# Patient Record
Sex: Male | Born: 1960 | Hispanic: Yes | Marital: Married | Smoking: Never smoker
Health system: Southern US, Community
[De-identification: ages and names within clinical notes are randomized; demographics above are authoritative.]

## PROBLEM LIST (undated history)

## (undated) DIAGNOSIS — D619 Aplastic anemia, unspecified: Secondary | ICD-10-CM

## (undated) HISTORY — DX: Aplastic anemia, unspecified: D61.9

---

## 2018-11-19 ENCOUNTER — Other Ambulatory Visit (HOSPITAL_COMMUNITY): Payer: Self-pay

## 2018-11-19 ENCOUNTER — Other Ambulatory Visit: Payer: Self-pay

## 2018-11-19 ENCOUNTER — Inpatient Hospital Stay (HOSPITAL_COMMUNITY)
Admission: EM | Admit: 2018-11-19 | Discharge: 2018-12-05 | DRG: 809 | Disposition: A | Discharge status: Home / Self Care | Payer: Self-pay | Attending: Internal Medicine | Admitting: Internal Medicine

## 2018-11-19 ENCOUNTER — Encounter (HOSPITAL_COMMUNITY): Payer: Self-pay | Admitting: Emergency Medicine

## 2018-11-19 DIAGNOSIS — R17 Unspecified jaundice: Secondary | ICD-10-CM | POA: Diagnosis present

## 2018-11-19 DIAGNOSIS — Z20828 Contact with and (suspected) exposure to other viral communicable diseases: Secondary | ICD-10-CM | POA: Diagnosis present

## 2018-11-19 DIAGNOSIS — D6949 Other primary thrombocytopenia: Secondary | ICD-10-CM | POA: Diagnosis present

## 2018-11-19 DIAGNOSIS — M25561 Pain in right knee: Secondary | ICD-10-CM | POA: Diagnosis present

## 2018-11-19 DIAGNOSIS — Z800 Family history of malignant neoplasm of digestive organs: Secondary | ICD-10-CM

## 2018-11-19 DIAGNOSIS — Z6825 Body mass index (BMI) 25.0-25.9, adult: Secondary | ICD-10-CM

## 2018-11-19 DIAGNOSIS — D7589 Other specified diseases of blood and blood-forming organs: Secondary | ICD-10-CM

## 2018-11-19 DIAGNOSIS — D61818 Other pancytopenia: Principal | ICD-10-CM

## 2018-11-19 DIAGNOSIS — R634 Abnormal weight loss: Secondary | ICD-10-CM | POA: Diagnosis present

## 2018-11-19 DIAGNOSIS — R52 Pain, unspecified: Secondary | ICD-10-CM

## 2018-11-19 DIAGNOSIS — E876 Hypokalemia: Secondary | ICD-10-CM | POA: Diagnosis present

## 2018-11-19 LAB — CBC WITH DIFFERENTIAL/PLATELET
Abs Immature Granulocytes: 0.01 10*3/uL (ref 0.00–0.07)
Basophils Absolute: 0 10*3/uL (ref 0.0–0.1)
Basophils Relative: 0 %
Eosinophils Absolute: 0.1 10*3/uL (ref 0.0–0.5)
Eosinophils Relative: 2 %
HCT: 17.2 % — ABNORMAL LOW (ref 39.0–52.0)
Hemoglobin: 5.8 g/dL — CL (ref 13.0–17.0)
Immature Granulocytes: 0 %
Lymphocytes Relative: 54 %
Lymphs Abs: 1.7 10*3/uL (ref 0.7–4.0)
MCH: 34.7 pg — ABNORMAL HIGH (ref 26.0–34.0)
MCHC: 33.7 g/dL (ref 30.0–36.0)
MCV: 103 fL — ABNORMAL HIGH (ref 80.0–100.0)
Monocytes Absolute: 0.2 10*3/uL (ref 0.1–1.0)
Monocytes Relative: 8 %
Neutro Abs: 1.1 10*3/uL — ABNORMAL LOW (ref 1.7–7.7)
Neutrophils Relative %: 36 %
Platelets: <5 10*3/uL — CL (ref 150–400)
RBC: 1.67 MIL/uL — ABNORMAL LOW (ref 4.22–5.81)
RDW: 16.8 % — ABNORMAL HIGH (ref 11.5–15.5)
WBC: 3.1 10*3/uL — ABNORMAL LOW (ref 4.0–10.5)
nRBC: 0 % (ref 0.0–0.2)

## 2018-11-19 LAB — COMPREHENSIVE METABOLIC PANEL
ALT: 25 U/L (ref 0–44)
AST: 38 U/L (ref 15–41)
Albumin: 3.7 g/dL (ref 3.5–5.0)
Alkaline Phosphatase: 52 U/L (ref 38–126)
Anion gap: 5 (ref 5–15)
BUN: 21 mg/dL — ABNORMAL HIGH (ref 6–20)
CO2: 24 mmol/L (ref 22–32)
Calcium: 8.6 mg/dL — ABNORMAL LOW (ref 8.9–10.3)
Chloride: 108 mmol/L (ref 98–111)
Creatinine, Ser: 0.92 mg/dL (ref 0.61–1.24)
GFR calc Af Amer: >60 mL/min (ref >60)
GFR calc non Af Amer: >60 mL/min (ref >60)
Glucose, Bld: 107 mg/dL — ABNORMAL HIGH (ref 70–99)
Potassium: 3.4 mmol/L — ABNORMAL LOW (ref 3.5–5.1)
Sodium: 137 mmol/L (ref 135–145)
Total Bilirubin: 1.4 mg/dL — ABNORMAL HIGH (ref 0.3–1.2)
Total Protein: 7.3 g/dL (ref 6.5–8.1)

## 2018-11-19 LAB — DIC (DISSEMINATED INTRAVASCULAR COAGULATION)PANEL
D-Dimer, Quant: 1.63 ug/mL-FEU — ABNORMAL HIGH (ref 0.00–0.50)
Fibrinogen: 317 mg/dL (ref 210–475)
INR: 1 (ref 0.8–1.2)
Platelets: <5 10*3/uL — CL (ref 150–400)
Prothrombin Time: 13.2 seconds (ref 11.4–15.2)
Smear Review: NONE SEEN
aPTT: 30 seconds (ref 24–36)

## 2018-11-19 LAB — IRON AND TIBC
Iron: 140 ug/dL (ref 45–182)
Saturation Ratios: 86 % — ABNORMAL HIGH (ref 17.9–39.5)
TIBC: 162 ug/dL — ABNORMAL LOW (ref 250–450)
UIBC: 22 ug/dL

## 2018-11-19 LAB — RETICULOCYTES
Immature Retic Fract: 16.1 % — ABNORMAL HIGH (ref 2.3–15.9)
RBC.: 1.58 MIL/uL — ABNORMAL LOW (ref 4.22–5.81)
Retic Count, Absolute: 20.2 10*3/uL (ref 19.0–186.0)
Retic Ct Pct: 1.3 % (ref 0.4–3.1)

## 2018-11-19 LAB — PROTIME-INR
INR: 1.1 (ref 0.8–1.2)
Prothrombin Time: 14 seconds (ref 11.4–15.2)

## 2018-11-19 LAB — FERRITIN: Ferritin: 624 ng/mL — ABNORMAL HIGH (ref 24–336)

## 2018-11-19 LAB — ABO/RH: ABO/RH(D): O POS

## 2018-11-19 LAB — VITAMIN B12: Vitamin B-12: 299 pg/mL (ref 180–914)

## 2018-11-19 LAB — HIV ANTIBODY (ROUTINE TESTING W REFLEX): HIV Screen 4th Generation wRfx: NONREACTIVE

## 2018-11-19 LAB — FOLATE: Folate: 21.8 ng/mL (ref >5.9)

## 2018-11-19 LAB — PREPARE RBC (CROSSMATCH)

## 2018-11-19 LAB — LACTATE DEHYDROGENASE: LDH: 298 U/L — ABNORMAL HIGH (ref 98–192)

## 2018-11-19 LAB — SAVE SMEAR(SSMR), FOR PROVIDER SLIDE REVIEW

## 2018-11-19 MED ORDER — POTASSIUM CHLORIDE CRYS ER 20 MEQ PO TBCR
40.0000 meq | EXTENDED_RELEASE_TABLET | Freq: Once | ORAL | Status: AC
  Administered 2018-11-19: 40 meq via ORAL
  Filled 2018-11-19: qty 2

## 2018-11-19 MED ORDER — ALBUTEROL SULFATE (2.5 MG/3ML) 0.083% IN NEBU: 2.5000 mg | INHALATION_SOLUTION | Freq: Four times a day (QID) | RESPIRATORY_TRACT | Status: DC | PRN

## 2018-11-19 MED ORDER — MAGIC MOUTHWASH W/LIDOCAINE
2.0000 mL | Freq: Three times a day (TID) | ORAL | Status: DC | PRN
  Filled 2018-11-19: qty 5

## 2018-11-19 MED ORDER — PANTOPRAZOLE SODIUM 40 MG PO TBEC
40.0000 mg | DELAYED_RELEASE_TABLET | Freq: Every day | ORAL | Status: DC
  Administered 2018-11-19 – 2018-11-24 (×6): 40 mg via ORAL
  Filled 2018-11-19 (×6): qty 1

## 2018-11-19 MED ORDER — ONDANSETRON HCL 4 MG PO TABS: 4.0000 mg | ORAL_TABLET | Freq: Four times a day (QID) | ORAL | Status: DC | PRN

## 2018-11-19 MED ORDER — ONDANSETRON HCL 4 MG/2ML IJ SOLN: 4.0000 mg | Freq: Four times a day (QID) | INTRAMUSCULAR | Status: DC | PRN

## 2018-11-19 MED ORDER — ENSURE ENLIVE PO LIQD
237.0000 mL | Freq: Two times a day (BID) | ORAL | Status: DC
  Administered 2018-11-20 – 2018-12-04 (×26): 237 mL via ORAL

## 2018-11-19 MED ORDER — SODIUM CHLORIDE 0.9 % IV SOLN
10.0000 mL/h | Freq: Once | INTRAVENOUS | Status: AC
  Administered 2018-11-19: 10 mL/h via INTRAVENOUS

## 2018-11-19 MED ORDER — ACETAMINOPHEN 650 MG RE SUPP: 650.0000 mg | Freq: Four times a day (QID) | RECTAL | Status: DC | PRN

## 2018-11-19 MED ORDER — ACETAMINOPHEN 325 MG PO TABS: 650.0000 mg | ORAL_TABLET | Freq: Four times a day (QID) | ORAL | Status: DC | PRN

## 2018-11-19 MED ORDER — SODIUM CHLORIDE 0.9% IV SOLUTION
Freq: Once | INTRAVENOUS | Status: AC
  Administered 2018-11-20: 01:00:00 via INTRAVENOUS

## 2018-11-19 NOTE — ED Provider Notes (Signed)
[facility] EMERGENCY DEPARTMENT Provider Note   CSN: [id number] Arrival date & time: 11/19/18  0356     History   Chief Complaint Chief Complaint  Patient presents with  . Fatigue    HPI Victor Porter is a 58 y.o. male who presents with bruising and bleeding gums.  No significant past medical history.  The patient states that over the past month and a half he has had gradually worsening bruising bruising and unexplained bleeding.  He states that he typically bruises very easily but not like this.  He reports associated fatigue.  He denies any fever, chills, chest pain, shortness of breath, abdominal pain, blood in the urine or stool.  He does not take any medicines.  He denies any use of NSAIDs.  No recent infections.  He states he came from [place] in February but has not traveled anywhere since then.  No family or personal history of any bleeding problems     HPI  History reviewed. No pertinent past medical history.  There are no active problems to display for this patient.   History reviewed. No pertinent surgical history.      Home Medications    Prior to Admission medications   Not on File    Family History History reviewed. No pertinent family history.  Social History Social History   Tobacco Use  . Smoking status: Never Smoker  . Smokeless tobacco: Never Used  Substance Use Topics  . Alcohol use: Never    Frequency: Never  . Drug use: Never     Allergies   Patient has no known allergies.   Review of Systems Review of Systems  Constitutional: Positive for fatigue. Negative for fever.  HENT:       +bleeding gums  Respiratory: Negative for shortness of breath.   Cardiovascular: Negative for chest pain.  Gastrointestinal: Negative for abdominal pain and blood in stool.  Genitourinary: Negative for hematuria.  Skin: Positive for color change.  Neurological: Negative for headaches.  Hematological: Bruises/bleeds easily.   All other systems reviewed and are negative.    Physical Exam Updated Vital Signs Temp 98.9 F (37.2 C) (Oral)   Ht 5\' 9"  (1.753 m)   Wt 77.1 kg   BMI 25.10 kg/m   Physical Exam Vitals signs and nursing note reviewed.  Constitutional:      General: He is not in acute distress.    Appearance: Normal appearance. He is well-developed. He is not ill-appearing.     Comments: Calm, cooperative. Very pleasant. Spanish speaking  HENT:     Head: Normocephalic and atraumatic.     Mouth/Throat:     Comments: Bleeding gums with ecchymosis of tongue and hard palate Eyes:     General: No scleral icterus.       Right eye: No discharge.        Left eye: No discharge.     Conjunctiva/sclera: Conjunctivae normal.     Pupils: Pupils are equal, round, and reactive to light.  Neck:     Musculoskeletal: Normal range of motion.  Cardiovascular:     Rate and Rhythm: Normal rate and regular rhythm.  Pulmonary:     Effort: Pulmonary effort is normal. No respiratory distress.     Breath sounds: Normal breath sounds.  Abdominal:     General: There is no distension.     Palpations: Abdomen is soft.     Tenderness: There is no abdominal tenderness.  Skin:    General: Skin  is warm and dry.     Findings: Bruising present.     Comments: Diffuse bruising over the trunk, upper and lower extremities with scattered petechiae  Neurological:     Mental Status: He is alert and oriented to person, place, and time.  Psychiatric:        Mood and Affect: Mood normal.        Behavior: Behavior normal.      ED Treatments / Results  Labs (all labs ordered are listed, but only abnormal results are displayed) Labs Reviewed  CBC WITH DIFFERENTIAL/PLATELET - Abnormal; Notable for the following components:      Result Value   WBC 3.1 (*)    RBC 1.67 (*)    Hemoglobin 5.8 (*)    HCT 17.2 (*)    MCV 103.0 (*)    MCH 34.7 (*)    RDW 16.8 (*)    Platelets <5 (*)    Neutro Abs 1.1 (*)    All other  components within normal limits  COMPREHENSIVE METABOLIC PANEL - Abnormal; Notable for the following components:   Potassium 3.4 (*)    Glucose, Bld 107 (*)    BUN 21 (*)    Calcium 8.6 (*)    Total Bilirubin 1.4 (*)    All other components within normal limits  NOVEL CORONAVIRUS, NAA (HOSPITAL ORDER, SEND-OUT TO REF LAB)  PROTIME-INR  LACTATE DEHYDROGENASE  VITAMIN B12  FOLATE  IRON AND TIBC  FERRITIN  RETICULOCYTES  SAVE SMEAR (SSMR)  PATHOLOGIST SMEAR REVIEW  DIC (DISSEMINATED INTRAVASCULAR COAGULATION) PANEL  HIV ANTIBODY (ROUTINE TESTING W REFLEX)  HEPATITIS C ANTIBODY  TYPE AND SCREEN  PREPARE RBC (CROSSMATCH)  ABO/RH    EKG    Radiology No results found.  Procedures Procedures (including critical care time)  CRITICAL CARE Performed by: Bethel BornKelly Marie Gekas   Total critical care time: 30 minutes  Critical care time was exclusive of separately billable procedures and treating other patients.  Critical care was necessary to treat or prevent imminent or life-threatening deterioration.  Critical care was time spent personally by me on the following activities: development of treatment plan with patient and/or surrogate as well as nursing, discussions with consultants, evaluation of patient's response to treatment, examination of patient, obtaining history from patient or surrogate, ordering and performing treatments and interventions, ordering and review of laboratory studies, ordering and review of radiographic studies, pulse oximetry and re-evaluation of patient's condition.   Medications Ordered in ED Medications  0.9 %  sodium chloride infusion (has no administration in time range)     Initial Impression / Assessment and Plan / ED Course  I have reviewed the triage vital signs and the nursing notes.  Pertinent labs & imaging results that were available during my care of the patient were reviewed by me and considered in my medical decision making (see chart  for details).  58 year old male presents with atraumatic bruising and petechiae.  There is been worsening over the past month and a half.  He is hemodynamically stable and afebrile.  Will obtain basic labs.  Shared visit with Dr. Preston FleetingGlick.  CBC is remarkable for pancytopenia.  Hemoglobin is 5.8 and platelets are less than 5.  We will give packed red blood cells.  CMP is overall unremarkable.  Patient will be admitted to hospitalist service.  Final Clinical Impressions(s) / ED Diagnoses   Final diagnoses:  Pancytopenia (HCC)  Macrocytosis    ED Discharge Orders    None  Bethel BornGekas, Kelly Marie, PA-C 11/19/18 [id number]    Dione BoozeGlick, David, MD 11/19/18 [phone]

## 2018-11-19 NOTE — ED Notes (Signed)
ED TO INPATIENT HANDOFF REPORT  ED Nurse Name and Phone #: Brie [phone]  S Name/Age/Gender Victor Porter 58 y.o. male Room/Bed: 022C/022C  Code Status   Code Status: Not on file  Home/SNF/Other Home Patient oriented to: self, place, time and situation Is this baseline? Yes   Triage Complete: Triage complete  Chief Complaint Blisters in mouth   Triage Note Spanish speaker pt c/o increase fatigue for the past 2 weeks and multiple bruises all over his body and blisters on his mouth. Pt denies any fever, chills, or change on smell or taste.    Allergies No Known Allergies  Level of Care/Admitting Diagnosis ED Disposition    ED Disposition Condition Comment   Admit  [facility] [[id number]]  Level of Care: Progressive [102]  Covid Evaluation: Screening Protocol (No Symptoms)  Diagnosis: Pancytopenia [facility](HCC) [[id number]]) [[id number]]  Admitting Physician: Magnus IvanRIADHOSP, MCADMITS [[id number]]  Attending Physician: Magnus IvanRIADHOSP, MCADMITS [[id number]]  Estimated length of stay: past midnight tomorrow  Certification:: I certify this patient will need inpatient services for at least 2 midnights  PT Class (Do Not Modify): Inpatient [101]  PT Acc Code (Do Not Modify): Private [1]       B Medical/Surgery History History reviewed. No pertinent past medical history. History reviewed. No pertinent surgical history.   A IV Location/Drains/Wounds Patient Lines/Drains/Airways Status   Active Line/Drains/Airways    Name:   Placement date:   Placement time:   Site:   Days:   Peripheral IV 11/19/18 Right Antecubital   11/19/18    0434    Antecubital   less than 1   Peripheral IV 11/19/18 Right Wrist   11/19/18    0630    Wrist   less than 1          Intake/Output Last 24 hours No intake or output data in the 24 hours ending 11/19/18 [id number]  Labs/Imaging Results for orders placed or performed during the hospital encounter of 11/19/18 (from the past 48 hour(s))  CBC with  Differential     Status: Abnormal   Collection Time: 11/19/18  4:20 AM  Result Value Ref Range   WBC 3.1 (L) 4.0 - 10.5 K/uL   RBC 1.67 (L) 4.22 - 5.81 MIL/uL   Hemoglobin 5.8 (LL) 13.0 - 17.0 g/dL    Comment: REPEATED TO VERIFY THIS CRITICAL RESULT HAS VERIFIED AND BEEN CALLED TO B.Nandi Tonnesen,RN BY BONNIE Ural Acree ON 06 12 2020 AT 0545, AND HAS BEEN READ BACK.     HCT 17.2 (L) 39.0 - 52.0 %   MCV 103.0 (H) 80.0 - 100.0 fL   MCH 34.7 (H) 26.0 - 34.0 pg   MCHC 33.7 30.0 - 36.0 g/dL   RDW 84.116.8 (H) 32.411.5 - 40.115.5 %   Platelets <5 (LL) 150 - 400 K/uL    Comment: REPEATED TO VERIFY PLATELET COUNT CONFIRMED BY SMEAR Immature Platelet Fraction may be clinically indicated, consider ordering this additional test [id number] CRITICAL RESULT CALLED TO, READ BACK BY AND VERIFIED WITH: B.Markan Cazarez,RN 11/19/2018 0545 DAVISB    nRBC 0.0 0.0 - 0.2 %   Neutrophils Relative % 36 %   Neutro Abs 1.1 (L) 1.7 - 7.7 K/uL   Lymphocytes Relative 54 %   Lymphs Abs 1.7 0.7 - 4.0 K/uL   Monocytes Relative 8 %   Monocytes Absolute 0.2 0.1 - 1.0 K/uL   Eosinophils Relative 2 %   Eosinophils Absolute 0.1 0.0 - 0.5 K/uL   Basophils Relative 0 %   Basophils  Absolute 0.0 0.0 - 0.1 K/uL   Immature Granulocytes 0 %   Abs Immature Granulocytes 0.01 0.00 - 0.07 K/uL    Comment: Performed at [facility], [address] [place], [place] [address]  Protime-INR     Status: None   Collection Time: 11/19/18  4:20 AM  Result Value Ref Range   Prothrombin Time 14.0 11.4 - 15.2 seconds   INR 1.1 0.8 - 1.2    Comment: (NOTE) INR goal varies based on device and disease states. Performed at [facility], [address] [place], [place] [address]   Comprehensive metabolic panel     Status: Abnormal   Collection Time: 11/19/18  4:32 AM  Result Value Ref Range   Sodium 137 135 - 145 mmol/L   Potassium 3.4 (L) 3.5 - 5.1 mmol/L   Chloride 108 98 - 111 mmol/L   CO2 24 22 - 32 mmol/L   Glucose, Bld 107 (H) 70 - 99 mg/dL    BUN 21 (H) 6 - 20 mg/dL   Creatinine, Ser 0.980.92 0.61 - 1.24 mg/dL   Calcium 8.6 (L) 8.9 - 10.3 mg/dL   Total Protein 7.3 6.5 - 8.1 g/dL   Albumin 3.7 3.5 - 5.0 g/dL   AST 38 15 - 41 U/L   ALT 25 0 - 44 U/L   Alkaline Phosphatase 52 38 - 126 U/L   Total Bilirubin 1.4 (H) 0.3 - 1.2 mg/dL   GFR calc non Af Amer >60 >60 mL/min   GFR calc Af Amer >60 >60 mL/min   Anion gap 5 5 - 15    Comment: Performed at [facility], [address] [place], [place] [address]  Type and screen [facility]     Status: None (Preliminary result)   Collection Time: 11/19/18  5:00 AM  Result Value Ref Range   ABO/RH(D) O POS    Antibody Screen NEG    Sample Expiration 11/22/2018,2359    Unit Number [id number]    Blood Component Type RED CELLS,LR    Unit division 00    Status of Unit ALLOCATED    Transfusion Status OK TO TRANSFUSE    Crossmatch Result Compatible    Unit Number [id number]    Blood Component Type RED CELLS,LR    Unit division 00    Status of Unit ISSUED    Transfusion Status OK TO TRANSFUSE    Crossmatch Result      Compatible Performed at [facility], [address] [place], [place] [address]   ABO/Rh     Status: None (Preliminary result)   Collection Time: 11/19/18  5:00 AM  Result Value Ref Range   ABO/RH(D)      O POS Performed at [facility], [address] [place], [place] [address]   Prepare RBC     Status: None   Collection Time: 11/19/18  5:57 AM  Result Value Ref Range   Order Confirmation      ORDER PROCESSED BY BLOOD BANK Performed at [facility], [address] [place], [place] [address]   Reticulocytes     Status: Abnormal   Collection Time: 11/19/18  6:25 AM  Result Value Ref Range   Retic Ct Pct 1.3 0.4 - 3.1 %   RBC. 1.58 (L) 4.22 - 5.81 MIL/uL   Retic Count, Absolute 20.2 19.0 - 186.0 K/uL   Immature Retic Fract 16.1 (H) 2.3 - 15.9 %  Comment: Performed at [facility], [place] [address],  [place], [place] [address]   No results found.  Pending Labs Unresulted Labs (From admission, onward)    Start     Ordered   11/19/18 0645  HIV Antibody (routine testing w rflx)  Once,   R     11/19/18 0645   11/19/18 0623  Hepatitis C antibody  Once,   STAT     11/19/18 0622   11/19/18 0614  Save Smear  Once,   STAT     11/19/18 0614   11/19/18 0614  Pathologist smear review  Once,   STAT     11/19/18 0614   11/19/18 0614  DIC (disseminated intravasc coag) panel  ONCE - STAT,   STAT     11/19/18 0614   11/19/18 0558  Novel Coronavirus, NAA (hospital order; send-out to ref lab)  Once,   STAT    Comments: No isolation needed for this testing (if isolation ordered for another indication, maintain current isolation).   Question:  Pre-procedural testing  Answer:  Yes   11/19/18 0557   11/19/18 0554  Vitamin B12  (Anemia Panel (PNL))  ONCE - STAT,   STAT     11/19/18 0555   11/19/18 0554  Folate  (Anemia Panel (PNL))  ONCE - STAT,   STAT     11/19/18 0555   11/19/18 0554  Iron and TIBC  (Anemia Panel (PNL))  ONCE - STAT,   STAT     11/19/18 0555   11/19/18 0554  Ferritin  (Anemia Panel (PNL))  ONCE - STAT,   STAT     11/19/18 0555   11/19/18 0437  Lactate dehydrogenase  ONCE - STAT,   STAT     11/19/18 0436          Vitals/Pain Today's Vitals   11/19/18 0631 11/19/18 0633 11/19/18 0645 11/19/18 0700  BP: 119/76  124/77 125/75  Pulse: 67  66 62  Resp: (!) 22  (!) 23 17  Temp:  98 F (36.7 C)    TempSrc:  Oral    SpO2: 100%  100% 100%  Weight:      Height:      PainSc:        Isolation Precautions No active isolations  Medications Medications  potassium chloride SA (K-DUR) CR tablet 40 mEq (has no administration in time range)  0.9 %  sodium chloride infusion (10 mL/hr Intravenous New Bag/Given 11/19/18 0701)    Mobility walks Low fall risk   Focused Assessments    R Recommendations: See Admitting Provider Note  Report given to:   Additional Notes:

## 2018-11-19 NOTE — Plan of Care (Signed)
Patient stable, discussed POC with patient via interpreter, agreeable with plan, denies question/concerns at this time.

## 2018-11-19 NOTE — Consult Note (Addendum)
[facility]  [phone] [phone]    [place]  Referring MD:  Dr. Fuller Plan  Reason for Referral: Pancytopenia  HPI: Mr. Victor Porter is a 58 year old with no significant past medical history who presented to the ER with progressive fatigue, easily bruisability, and bleeding gums. History obtained through [employer] interpreter, Mckinley Jewel [phone].  The patient is originally from [place] and has been visiting family here in the [place] since February 2020.  He was unable to return to [place] secondary to the COVID-19 pandemic.  Work-up in the emergency room revealed a white blood cell count of 3.1, hemoglobin 5.8, elevated MCV and MCH, platelets less than 5000.  LFTs were normal but the total bilirubin was slightly elevated at 1.4.  LDH was elevated at 298.  Vitamin B12 and folate levels were normal.  Ferritin elevated at 624, percent saturation 86%, TIBC 162, iron 140.  Absolute reticulocyte count was not elevated.  To dose of packed red blood cells and a unit of platelets have been ordered.  There are no prior labs available to me to compare his current labs to.  The patient reports that he has become progressively more fatigued and had generalized weakness over the past 15 to 20 days.  He has also noticed some increased bruising, bleeding gums, and nosebleed over the past few days.  He reports anorexia and a weight loss of about 20 pounds over the past month.  Denies night sweats.  Patient denies headaches and dizziness.  Denies fevers and chills.  Denies chest discomfort, shortness of breath, cough.  Denies abdominal pain, nausea, vomiting, constipation, diarrhea.  Other than bleeding gums and epistaxis, he denies other bleeding.  He reports that he has been taking herbal life but is taking any other supplements, herbal products, or over-the-counter medications.  Hematology was asked see the patient to make recommendations  regarding his pancytopenia.  History reviewed. No pertinent past medical history.:  History reviewed. No pertinent surgical history.:   CURRENT MEDS: Current Facility-Administered Medications  Medication Dose Route Frequency Provider Last Rate Last Dose   0.9 %  sodium chloride infusion (Manually program via Guardrails IV Fluids)   Intravenous Once Norval Morton, MD   Stopped at 11/19/18 0940   acetaminophen (TYLENOL) tablet 650 mg  650 mg Oral Q6H PRN Norval Morton, MD       Or   acetaminophen (TYLENOL) suppository 650 mg  650 mg Rectal Q6H PRN Fuller Plan A, MD       albuterol (PROVENTIL) (2.5 MG/3ML) 0.083% nebulizer solution 2.5 mg  2.5 mg Nebulization Q6H PRN Norval Morton, MD       magic mouthwash w/lidocaine  2 mL Oral TID PRN Fuller Plan A, MD       ondansetron (ZOFRAN) tablet 4 mg  4 mg Oral Q6H PRN Fuller Plan A, MD       Or   ondansetron (ZOFRAN) injection 4 mg  4 mg Intravenous Q6H PRN Smith, Rondell A, MD       pantoprazole (PROTONIX) EC tablet 40 mg  40 mg Oral Daily Smith, Rondell A, MD          No Known Allergies:   Family History  Problem Relation Age of Onset   Cancer Father   :  Social History   Socioeconomic History   Marital status: Married    Spouse name: Not on file   Number of children: Not on file  Years of education: Not on file   Highest education level: Not on file  Occupational History   Not on file  Social Needs   Financial resource strain: Not on file   Food insecurity    Worry: Not on file    Inability: Not on file   Transportation needs    Medical: Not on file    Non-medical: Not on file  Tobacco Use   Smoking status: Never Smoker   Smokeless tobacco: Never Used  Substance and Sexual Activity   Alcohol use: Never    Frequency: Never   Drug use: Never   Sexual activity: Not on file  Lifestyle   Physical activity    Days per week: Not on file    Minutes per session: Not on file    Stress: Not on file  Relationships   Social connections    Talks on phone: Not on file    Gets together: Not on file    Attends religious service: Not on file    Active member of club or organization: Not on file    Attends meetings of clubs or organizations: Not on file    Relationship status: Not on file   Intimate partner violence    Fear of current or ex partner: Not on file    Emotionally abused: Not on file    Physically abused: Not on file    Forced sexual activity: Not on file  Other Topics Concern   Not on file  Social History Narrative   Not on file  :  REVIEW OF SYSTEMS: A comprehensive 14 point review of systems was negative except as noted in the HPI.  Exam: Patient Vitals for the past 24 hrs:  BP Temp Temp src Pulse Resp SpO2 Height Weight  11/19/18 0731 122/76 (!) 97.5 F (36.4 C) Oral (!) 57 (!) 22 100 % -- --  11/19/18 0720 118/77 -- -- 60 -- 100 % -- --  11/19/18 0719 -- 98 F (36.7 C) Oral -- -- -- -- --  11/19/18 0719 -- -- -- -- 18 -- -- --  11/19/18 0716 118/74 -- -- 76 16 100 % -- --  11/19/18 0700 125/75 -- -- 62 17 100 % -- --  11/19/18 0645 124/77 -- -- 66 (!) 23 100 % -- --  11/19/18 0633 -- 98 F (36.7 C) Oral -- -- -- -- --  11/19/18 0631 119/76 -- -- 67 (!) 22 100 % -- --  11/19/18 0615 117/72 -- -- 64 (!) 25 99 % -- --  11/19/18 0600 108/77 -- -- 60 20 100 % -- --  11/19/18 0545 110/76 -- -- 64 19 100 % -- --  11/19/18 0530 112/74 -- -- (!) 58 18 100 % -- --  11/19/18 0515 107/77 -- -- 60 18 100 % -- --  11/19/18 0500 115/74 -- -- 62 20 98 % -- --  11/19/18 0445 116/72 -- -- 65 16 100 % -- --  11/19/18 0430 116/72 -- -- 70 14 100 % -- --  11/19/18 0419 120/77 -- -- 66 -- 100 % -- --  11/19/18 0416 -- 98.9 F (37.2 C) Oral -- -- -- -- --  11/19/18 0411 -- -- -- -- -- -- _0  (1.753 m) 170 lb (77.1 kg)    General:  well-nourished in no acute distress.   Eyes:  no scleral icterus.   ENT: Ecchymosis noted to his tongue and posterior  pharynx.   Lymphatics:  Negative  cervical, supraclavicular or axillary adenopathy.   Respiratory: lungs were clear bilaterally without wheezing or crackles.   Cardiovascular:  Regular rate and rhythm, S1/S2, without murmur, rub or gallop.  There was no pedal edema.    GI:  abdomen was soft, flat, nontender, nondistended, without organomegaly.  Musculoskeletal:  no spinal tenderness of palpation of vertebral spine.   Skin exam showed multiple ecchymotic areas all over his body and petechiae.   Neuro exam was nonfocal.  Patient was alert and oriented.  Attention was good.   Language was appropriate.  Mood was normal without depression.  Speech was not pressured.  Thought content was not tangential.    LABS:  Lab Results  Component Value Date   WBC 3.1 (L) 11/19/2018   HGB 5.8 (LL) 11/19/2018   HCT 17.2 (L) 11/19/2018   PLT <5 (LL) 11/19/2018   GLUCOSE 107 (H) 11/19/2018   ALT 25 11/19/2018   AST 38 11/19/2018   NA 137 11/19/2018   K 3.4 (L) 11/19/2018   CL 108 11/19/2018   CREATININE 0.92 11/19/2018   BUN 21 (H) 11/19/2018   CO2 24 11/19/2018   INR 1.0 11/19/2018    No results found.   ASSESSMENT AND PLAN:  1. Pancytopenia 2. Hypokalemia  -Review peripheral smear -I have discussed the bone marrow biopsy with the patient and he is agreeable to this. Bone marrow biopsy by interventional radiology has been ordered.   -Transfuse packed red blood cells for hemoglobin less than 7.0 or active bleeding.  2 units of packed red blood cells have already been ordered for today. -Transfuse platelets for platelet count less than 10,000 or active bleeding.  2 units of platelets has been ordered today. -Follow daily CBC -Replete K+ per hospitalist.   Thank you for this referral.  Mikey Bussing, DNP, AGPCNP-BC, AOCNP  ADDENDUM: I saw and examined [name].  I agree with the above assessment.  I looked at his blood smear.  He absolutely has no platelets.  I would not think that this  would be an autoimmune thrombocytopenia given the marked anemia and leukopenia.  His retake count certainly is not high so he is not hemolyzing so this would not be an Evans syndrome.  I did see some immature white blood cells.  He has mostly mature myeloid cells.  He has mature lymphoid cells.  I saw no nucleated red blood cells.  He had no rouleaux formation.  There was no schistocytes.  Clearly, this is going to come down to his bone marrow biopsy.  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  His B12 level is okay so this would not be pernicious anemia.  I probably would get a CT scan or ultrasound of his abdomen looking for hepatomegaly or splenomegaly or for that matter any lymphadenopathy.  I would not think that this is some type of aplastic process from hepatitis.  His HIV is negative.  I must say that this is a very interesting case.  Again, it is going to come down to the bone marrow biopsy.  Hopefully this can be done on Monday.  There should be a preliminary result come Tuesday.  I would just support him for right now.  I would try to be somewhat conservative with platelet transfusions.  I agree with platelets for right now but unless he is bleeding, I would not give him platelets.  I will start him on Decadron just on the outside chance that this might be some immune  mediated thrombocytopenia.  We will follow along but we really have not much to add until the bone marrow biopsy is done.  Lattie Haw, MD  2 Timothy 1:7

## 2018-11-19 NOTE — ED Notes (Signed)
Linward Foster (sister)- [phone]. Sister went home and would like to be updated and informed when to pick him up.

## 2018-11-19 NOTE — H&P (Addendum)
History and Physical    Victor Porter OFB:[id number] DOB: 1960/11/11 DOA: 11/19/2018  Referring MD/NP/PA: Janetta Hora, PA-C PCP: Patient, No Pcp Per  Patient coming from: Home  Chief Complaint: Tiredness  I have personally briefly reviewed patient's old medical records in [place]   HPI: Victor Porter is a 58 y.o. Spanish-speaking male without known past medical; who presents with complaints of being progressively more tired over the last month.  Spanish interpreter services utilized.  He visiting from [place] here on February 20th.  His activity has been limited because of becoming easily fatigued.  As much as he can remember he is always easily bruised, but never to this extent.  He has bruises all over his body including head, tongue/mouth, upper, and lower remedies.  Denies having any significant falls or trauma to cause the bruising.  Complains of having a hard nodule where each bruises and is painful.  Associated symptoms include unwarranted weight loss of approximately 10 kg over 3 months, bleeding gums, and belching after meals.  He denies having any significant fever, chest pain, shortness of breath, dysuria, nausea, vomiting, or diarrhea.  Patient has no primary care provider and has not received regular healthcare.  He last had a few beers a couple months ago.  He does not routinely drink alcohol or smoke any tobacco.  His father passed away in 17-Jul-2004 at the age of 54, and had similar symptoms.  However, he reports that they were not told what caused his death.  Of note he does report that he has been using herbalife supplements including teas, formula 1, and some there other products.   The patient sister reports that their father died from stomach cancer and had been losing weight and had blood in his stools.  ED Course: On admission into the emergency department patient was found to be afebrile, pulse 57-76, respirations 14-25, and all other vital signs  maintained.  Labs revealed WBC 3.1, hemoglobin 5.8 with elevated MCV and MCH, platelets < 5, BUN 21, creatinine 0.92, LFTs wnl, total bilirubin 1.4, LDH 298, iron 140, TIBC 162, and ferritin 624.  Patient was ordered to be transfused 2 units of packed red blood cells.  Patient was accepted to a progressive bed as inpatient.  Review of Systems  Constitutional: Positive for malaise/fatigue and weight loss. Negative for fever.  HENT: Negative for congestion and sore throat.        Positive for bleeding gums  Eyes: Negative for photophobia and pain.  Respiratory: Negative for cough and shortness of breath.   Cardiovascular: Negative for chest pain and leg swelling.  Gastrointestinal: Negative for abdominal pain, diarrhea, nausea and vomiting.  Genitourinary: Negative for dysuria and hematuria.  Musculoskeletal: Negative for falls.  Skin: Negative for rash.       Multiple areas of bruising  Neurological: Positive for weakness. Negative for focal weakness and loss of consciousness.  Endo/Heme/Allergies: Negative for polydipsia. Bruises/bleeds easily.  Psychiatric/Behavioral: Negative for memory loss and substance abuse.    History reviewed. No pertinent past medical history.  History reviewed. No pertinent surgical history.   reports that he has never smoked. He has never used smokeless tobacco. He reports that he does not drink alcohol or use drugs.  No Known Allergies  Family History  Problem Relation Age of Onset  . Stomach cancer Father    Prior to Admission medications   Not on File    Physical Exam:  Constitutional: Middle-age male in NAD, calm, comfortable  Vitals:   11/19/18 0719 11/19/18 0719 11/19/18 0720 11/19/18 0731  BP:   118/77 122/76  Pulse:   60 (!) 57  Resp: 18   (!) 22  Temp:  98 F (36.7 C)    TempSrc:  Oral    SpO2:   100% 100%  Weight:      Height:       Eyes: PERRL, lids and conjunctivae normal ENMT: Bleeding present of the gums with bruise on the  left side of the tongue and left inner cheek. Neck: normal, supple, no masses, no thyromegaly Respiratory: clear to auscultation bilaterally, no wheezing, no crackles. Normal respiratory effort. No accessory muscle use.  Cardiovascular: Regular rate and rhythm, no murmurs / rubs / gallops. No extremity edema. 2+ pedal pulses. No carotid bruits.  Abdomen: no tenderness, no masses palpated. No hepatosplenomegaly. Bowel sounds positive.  Musculoskeletal: no clubbing / cyanosis. No joint deformity upper and lower extremities. Good ROM, no contractures. Normal muscle tone.  Skin: Bruising present of the left forehead and multiple areas of the upper and lower extremities with centrally located small areas of induration appreciated. Neurologic: CN 2-12 grossly intact. Sensation intact, DTR normal. Strength 5/5 in all 4.  Psychiatric: Normal judgment and insight. Alert and oriented x 3. Normal mood.     Labs on Admission: I have personally reviewed following labs and imaging studies  CBC: Recent Labs  Lab 11/19/18 0420  WBC 3.1*  NEUTROABS 1.1*  HGB 5.8*  HCT 17.2*  MCV 103.0*  PLT <5*   Basic Metabolic Panel: Recent Labs  Lab 11/19/18 0432  NA 137  K 3.4*  CL 108  CO2 24  GLUCOSE 107*  BUN 21*  CREATININE 0.92  CALCIUM 8.6*   GFR: Estimated Creatinine Clearance: 89.7 mL/min (by C-G formula based on SCr of 0.92 mg/dL). Liver Function Tests: Recent Labs  Lab 11/19/18 0432  AST 38  ALT 25  ALKPHOS 52  BILITOT 1.4*  PROT 7.3  ALBUMIN 3.7   No results for input(s): LIPASE, AMYLASE in the last 168 hours. No results for input(s): AMMONIA in the last 168 hours. Coagulation Profile: Recent Labs  Lab 11/19/18 0420  INR 1.1   Cardiac Enzymes: No results for input(s): CKTOTAL, CKMB, CKMBINDEX, TROPONINI in the last 168 hours. BNP (last 3 results) No results for input(s): PROBNP in the last 8760 hours. HbA1C: No results for input(s): HGBA1C in the last 72 hours. CBG: No  results for input(s): GLUCAP in the last 168 hours. Lipid Profile: No results for input(s): CHOL, HDL, LDLCALC, TRIG, CHOLHDL, LDLDIRECT in the last 72 hours. Thyroid Function Tests: No results for input(s): TSH, T4TOTAL, FREET4, T3FREE, THYROIDAB in the last 72 hours. Anemia Panel: Recent Labs    11/19/18 0625  VITAMINB12 299  FERRITIN 624*  TIBC 162*  IRON 140  RETICCTPCT 1.3   Urine analysis: No results found for: COLORURINE, APPEARANCEUR, LABSPEC, PHURINE, GLUCOSEU, HGBUR, BILIRUBINUR, KETONESUR, PROTEINUR, UROBILINOGEN, NITRITE, LEUKOCYTESUR Sepsis Labs: No results found for this or any previous visit (from the past 240 hour(s)).   Radiological Exams on Admission: No results found.    Assessment/Plan Pancytopenia: Acute.  Patient presents with reports of fatigue, easy bruising, and bleeding gums.  WBC 3.1, hemoglobin 5.8, and platelets <5, Vitamin B12 level 298, iron wnl, LDL 298, d-dimer 1.63, fibrinogen 317, INR 1, and APTT 30.  Suspect symptoms most likely related to malignancy, but given history must also question the possibility of toxicity from herbalife supplements use.  Discussed case  with oncology who recommended transfusion of platelets and obtain bone marrow biopsy.  -Admit to a progressive bed -Follow-up HIV, hepatitis C, DIC, peripheral smear, folate -Continue with transfusion of 2 units of packed red blood cells -Transfused 2 units of platelets -Check CT guided bone marrow biopsy ordered -Hematology/oncology consulted, will follow-up for further recommendation  Hypokalemia: Acute. Potassium 3.4 on admission.  Patient was given 40 mEq of potassium chloride in the ED. -Replace as needed  Weight loss: Patient reports weight loss of 10 kg over the last 2 months.  Suspect likely related with above.  Patient did report utilizing herbalife supplementation.  -Continue to monitor  Hyperbilirubinemia: Acute.  Total bilirubin mildly elevated to 1.4. -Continue to  monitor  DVT prophylaxis: SCDs   Code Status: Full Family Communication: Discussed plan of care with the patient's sister over the phone Disposition Plan: Possibly discharge home in 2 to 3 days depending on response to therapy Consults called: Hematology Admission status: inpatient   Norval Morton MD [facility] Pager [phone]   If 7PM-7AM, please contact night-coverage [email or web address] Password [facility]  11/19/2018, 7:46 AM

## 2018-11-19 NOTE — ED Triage Notes (Signed)
Spanish speaker pt c/o increase fatigue for the past 2 weeks and multiple bruises all over his body and blisters on his mouth. Pt denies any fever, chills, or change on smell or taste.

## 2018-11-19 NOTE — ED Notes (Signed)
Blood bank tried calling, blood is ready for htis pt

## 2018-11-20 ENCOUNTER — Inpatient Hospital Stay (HOSPITAL_COMMUNITY): Payer: Self-pay

## 2018-11-20 LAB — COMPREHENSIVE METABOLIC PANEL
ALT: 21 U/L (ref 0–44)
AST: 29 U/L (ref 15–41)
Albumin: 3.2 g/dL — ABNORMAL LOW (ref 3.5–5.0)
Alkaline Phosphatase: 57 U/L (ref 38–126)
Anion gap: 8 (ref 5–15)
BUN: 15 mg/dL (ref 6–20)
CO2: 22 mmol/L (ref 22–32)
Calcium: 8.7 mg/dL — ABNORMAL LOW (ref 8.9–10.3)
Chloride: 108 mmol/L (ref 98–111)
Creatinine, Ser: 0.72 mg/dL (ref 0.61–1.24)
GFR calc Af Amer: >60 mL/min (ref >60)
GFR calc non Af Amer: >60 mL/min (ref >60)
Glucose, Bld: 99 mg/dL (ref 70–99)
Potassium: 4 mmol/L (ref 3.5–5.1)
Sodium: 138 mmol/L (ref 135–145)
Total Bilirubin: 1.3 mg/dL — ABNORMAL HIGH (ref 0.3–1.2)
Total Protein: 6.7 g/dL (ref 6.5–8.1)

## 2018-11-20 LAB — PREPARE PLATELET PHERESIS
Unit division: 0
Unit division: 0
Unit division: 0

## 2018-11-20 LAB — CBC
HCT: 24.5 % — ABNORMAL LOW (ref 39.0–52.0)
Hemoglobin: 8.4 g/dL — ABNORMAL LOW (ref 13.0–17.0)
MCH: 33.2 pg (ref 26.0–34.0)
MCHC: 34.3 g/dL (ref 30.0–36.0)
MCV: 96.8 fL (ref 80.0–100.0)
Platelets: 41 10*3/uL — ABNORMAL LOW (ref 150–400)
RBC: 2.53 MIL/uL — ABNORMAL LOW (ref 4.22–5.81)
RDW: 16.9 % — ABNORMAL HIGH (ref 11.5–15.5)
WBC: 3.1 10*3/uL — ABNORMAL LOW (ref 4.0–10.5)
nRBC: 0 % (ref 0.0–0.2)

## 2018-11-20 LAB — BPAM PLATELET PHERESIS
Blood Product Expiration Date: 202006132359
Blood Product Expiration Date: 202006142359
Blood Product Expiration Date: 202006142359
ISSUE DATE / TIME: 202006121059
ISSUE DATE / TIME: 202006121150
ISSUE DATE / TIME: 202006121322
Unit Type and Rh: 6200
Unit Type and Rh: 6200
Unit Type and Rh: 7300

## 2018-11-20 LAB — HEPATITIS C ANTIBODY: HCV Ab: <0.1 s/co ratio (ref 0.0–0.9)

## 2018-11-20 LAB — NOVEL CORONAVIRUS, NAA (HOSP ORDER, SEND-OUT TO REF LAB; TAT 18-24 HRS): SARS-CoV-2, NAA: NOT DETECTED

## 2018-11-20 LAB — PATHOLOGIST SMEAR REVIEW

## 2018-11-20 LAB — HEMOGLOBIN AND HEMATOCRIT, BLOOD
HCT: 19.6 % — ABNORMAL LOW (ref 39.0–52.0)
Hemoglobin: 6.9 g/dL — CL (ref 13.0–17.0)

## 2018-11-20 LAB — PREPARE RBC (CROSSMATCH)

## 2018-11-20 MED ORDER — ADULT MULTIVITAMIN W/MINERALS CH
1.0000 | ORAL_TABLET | Freq: Every day | ORAL | Status: DC
  Administered 2018-11-20 – 2018-12-05 (×16): 1 via ORAL
  Filled 2018-11-20 (×16): qty 1

## 2018-11-20 MED ORDER — IOHEXOL 300 MG/ML  SOLN
100.0000 mL | Freq: Once | INTRAMUSCULAR | Status: AC | PRN
  Administered 2018-11-20: 15:00:00 100 mL via INTRAVENOUS

## 2018-11-20 MED ORDER — SODIUM CHLORIDE 0.9% IV SOLUTION
Freq: Once | INTRAVENOUS | Status: AC
  Administered 2018-11-20: 01:00:00 via INTRAVENOUS

## 2018-11-20 NOTE — Progress Notes (Signed)
Used the interpreter to explain the plan of care, CT scan at 1330 and contrast bottles Pt verbalizes understanding for drinking one at 1130 and one at 1230  Per CT technician pt cannot eat food at this time  Pt understanding  Pt denies any questions  Will continue to monitor

## 2018-11-20 NOTE — Progress Notes (Signed)
Initial Nutrition Assessment  DOCUMENTATION CODES:  Not applicable  INTERVENTION: Continue Ensure Enlive po BID, each supplement provides 350 kcal and 20 grams of protein  MVI with minerals  NUTRITION DIAGNOSIS:  Unintentional weight loss r/t unknown etiology as evidenced by pt report of 22 lb weight loss x 3 months   GOAL:  Patient will meet greater than or equal to 90% of their needs  MONITOR:  PO intake, Weight trends, I & O's, Labs, Supplement acceptance  REASON FOR ASSESSMENT:  Malnutrition Screening Tool    ASSESSMENT:  58 y/o spanish-speaking male w/o known PMhx. Presented to ED w/ 1 month of progressively worsening fatigue and bruising all over his body. Reports loss of 10kg x3 months, bleeding from gums, belching after eating. Has been in [place] visiting since February. In ED, labs/presentation were concerning for underlying malignancy. Pancytopenic. Admitted for med onc consultation.   RD operating remotely d/t covid precautions.   Patient is spanish speaking only and there is no past history in chart to review.   Pt reported a loss of ~22 lbs over the past 3 months. His current weight is listed at 170 lbs, though this measurement appears to have been reported/estimated.  Though pt reported a decrease in appetite/intake on MST, there is no mention of this in provider notes. Currently, he appears to have a great appetite. He is eating 100% of his meals and accepting the Ensure supplements provided as part of ONS protocol. Given lack of nutrition history available, will add  mvi.   Pt had BMB performed and results are pending.   Labs: Platelets <5.  WBC:3.1, Hgb: 5.8-> 8.4 after transfusion. Albumin:3.2, B12: 299 Meds: Ensure Enlive (accepting), ppi, ivf  Recent Labs  Lab 11/19/18 0432 11/20/18 1046  NA 137 138  K 3.4* 4.0  CL 108 108  CO2 24 22  BUN 21* 15  CREATININE 0.92 0.72  CALCIUM 8.6* 8.7*  GLUCOSE 107* 99    NUTRITION - FOCUSED PHYSICAL EXAM: Unable  to conduct  Diet Order:   Diet Order            Diet Heart Room service appropriate? Yes; Fluid consistency: Thin  Diet effective now             EDUCATION NEEDS:  No education needs have been identified at this time  Skin: Ecchymosis Arm, chest, legs, head  Last BM:  Unknown, PTA  Height:  Ht Readings from Last 1 Encounters:  11/19/18 5\' 9"  (1.753 m)   Weight:  Wt Readings from Last 1 Encounters:  11/19/18 77.1 kg   Ideal Body Weight:  72.73 kg  BMI:  Body mass index is 25.1 kg/m.  Estimated Nutritional Needs:  Kcal:  1900-2100 (25-27kcal/kg bw) Protein:  100-115g Pro (1.3-1.5g/kg bw) Fluid:  1.9-2.1 L fluid (74ml/kcal)  Burtis Junes RD, LDN, CNSC Clinical Nutrition Available Tues-Sat via Pager: [phone] 11/20/2018 6:48 PM

## 2018-11-20 NOTE — Progress Notes (Signed)
PROGRESS NOTE    Cindy Brindisi  BDZ:[id number] DOB: February 07, 1961 DOA: 11/19/2018 PCP: Patient, No Pcp Per  Brief Narrative: 58 y.o. Spanish-speaking male without known past medical; who presents with complaints of being progressively more tired over the last month.  Spanish interpreter services utilized.  He visiting from [place] here on February 20th.  His activity has been limited because of becoming easily fatigued.  As much as he can remember he is always easily bruised, but never to this extent.  He has bruises all over his body including head, tongue/mouth, upper, and lower remedies.  Denies having any significant falls or trauma to cause the bruising.  Complains of having a hard nodule where each bruises and is painful.  Associated symptoms include unwarranted weight loss of approximately 10 kg over 3 months, bleeding gums, and belching after meals.  He denies having any significant fever, chest pain, shortness of breath, dysuria, nausea, vomiting, or diarrhea.  Patient has no primary care provider and has not received regular healthcare.  He last had a few beers a couple months ago.  He does not routinely drink alcohol or smoke any tobacco.  His father passed away in Jul 27, 2004 at the age of 29, and had similar symptoms.  However, he reports that they were not told what caused his death.  Of note he does report that he has been using herbalife supplements including teas, formula 1, and some there other products.   The patient sister reports that their father died from stomach cancer and had been losing weight and had blood in his stools.  ED Course: On admission into the emergency department patient was found to be afebrile, pulse 57-76, respirations 14-25, and all other vital signs maintained.  Labs revealed WBC 3.1, hemoglobin 5.8 with elevated MCV and MCH, platelets < 5, BUN 21, creatinine 0.92, LFTs wnl, total bilirubin 1.4, LDH 298, iron 140, TIBC 162, and ferritin 624.  Patient was ordered  to be transfused 2 units of packed red blood cells.  Patient was accepted to a progressive bed as inpatient.   Assessment & Plan:   #1 pancytopenia of unclear etiology patient presented with complaints of generalized weakness easy bruising bleeding gums and weight loss.  Upon admission his white count was 3.1 hemoglobin 5.8 platelet count less than 5.  B12 iron and folate level normal  LDL 298 d-dimer 1.63 fibrinogen 317 reticulocyte count normal.  Patient so far has received 3 units of packed RBCs.  Repeat labs are pending for this morning.  Hepatitis C viral antibody negative HIV negative coronavirus negative.  Patient scheduled for bone marrow biopsy on Monday.  CT scan abdomen and pelvis pending for today.  Continue Decadron.  Patient needs continuous inpatient treatment due to severe thrombocytopenia and anemia being worked up.?  If this pancytopenia is related to tick related illness overnight the nurse removed 2 ticks from his body.  DVT prophylaxis: SCDs   Code Status: Full Family Communication patient did not wanted me to call any of his family members.   Disposition Plan: Pending clinical improvement Consults called: Hematology    Estimated body mass index is 25.1 kg/m as calculated from the following:   Height as of this encounter: _0  (1.753 m).   Weight as of this encounter: 77.1 kg.  Subjective:  Resting in bed reports feeling better than yesterday.  Overnight RN removed 2 ticks from his body one from his neck and one on his left hip.  He is unsure when  and how this happened. Objective: Vitals:   11/20/18 0311 11/20/18 0354 11/20/18 0420 11/20/18 0429  BP:   124/72   Pulse:  68 63   Resp: 18 18 (!) 26 17  Temp:  98.9 F (37.2 C) 98.4 F (36.9 C)   TempSrc:  Oral Oral   SpO2:  100% 99%   Weight:      Height:        Intake/Output Summary (Last 24 hours) at 11/20/2018 0854 Last data filed at 11/20/2018 0421 Gross per 24 hour  Intake 2796 ml  Output 1200 ml  Net  1596 ml   Filed Weights   11/19/18 0411  Weight: 77.1 kg    Examination:  General exam: Appears calm and comfortable  Respiratory system: Clear to auscultation. Respiratory effort normal. Cardiovascular system: S1 & S2 heard, RRR. No JVD, murmurs, rubs, gallops or clicks. No pedal edema. Gastrointestinal system: Abdomen is nondistended, soft and nontender. No organomegaly or masses felt. Normal bowel sounds heard. Central nervous system: Alert and oriented. No focal neurological deficits. Extremities: Symmetric 5 x 5 power. Skin: No rashes, lesions or ulcers Psychiatry: Judgement and insight appear normal. Mood & affect appropriate.     Data Reviewed: I have personally reviewed following labs and imaging studies  CBC: Recent Labs  Lab 11/19/18 0420 11/19/18 0625 11/19/18 2349  WBC 3.1*  --   --   NEUTROABS 1.1*  --   --   HGB 5.8*  --  6.9*  HCT 17.2*  --  19.6*  MCV 103.0*  --   --   PLT <5* <5*  --    Basic Metabolic Panel: Recent Labs  Lab 11/19/18 0432  NA 137  K 3.4*  CL 108  CO2 24  GLUCOSE 107*  BUN 21*  CREATININE 0.92  CALCIUM 8.6*   GFR: Estimated Creatinine Clearance: 89.7 mL/min (by C-G formula based on SCr of 0.92 mg/dL). Liver Function Tests: Recent Labs  Lab 11/19/18 0432  AST 38  ALT 25  ALKPHOS 52  BILITOT 1.4*  PROT 7.3  ALBUMIN 3.7   No results for input(s): LIPASE, AMYLASE in the last 168 hours. No results for input(s): AMMONIA in the last 168 hours. Coagulation Profile: Recent Labs  Lab 11/19/18 0420 11/19/18 0625  INR 1.1 1.0   Cardiac Enzymes: No results for input(s): CKTOTAL, CKMB, CKMBINDEX, TROPONINI in the last 168 hours. BNP (last 3 results) No results for input(s): PROBNP in the last 8760 hours. HbA1C: No results for input(s): HGBA1C in the last 72 hours. CBG: No results for input(s): GLUCAP in the last 168 hours. Lipid Profile: No results for input(s): CHOL, HDL, LDLCALC, TRIG, CHOLHDL, LDLDIRECT in the last  72 hours. Thyroid Function Tests: No results for input(s): TSH, T4TOTAL, FREET4, T3FREE, THYROIDAB in the last 72 hours. Anemia Panel: Recent Labs    11/19/18 0625  VITAMINB12 299  FOLATE 21.8  FERRITIN 624*  TIBC 162*  IRON 140  RETICCTPCT 1.3   Sepsis Labs: No results for input(s): PROCALCITON, LATICACIDVEN in the last 168 hours.  Recent Results (from the past 240 hour(s))  Novel Coronavirus, NAA (hospital order; send-out to ref lab)     Status: None   Collection Time: 11/19/18  5:58 AM   Specimen: Nasopharyngeal Swab; Respiratory  Result Value Ref Range Status   SARS-CoV-2, NAA NOT DETECTED NOT DETECTED Final    Comment: (NOTE) This test was developed and its performance characteristics determined by [employer]. This test has not been FDA  cleared or approved. This test has been authorized by FDA under an Emergency Use Authorization (EUA). This test is only authorized for the duration of time the declaration that circumstances exist justifying the authorization of the emergency use of in vitro diagnostic tests for detection of SARS-CoV-2 virus and/or diagnosis of COVID-19 infection under section 564(b)(1) of the Act, 21 U.S.C. 832NVB-1(Y)(6), unless the authorization is terminated or revoked sooner. When diagnostic testing is negative, the possibility of a false negative result should be considered in the context of a patient's recent exposures and the presence of clinical signs and symptoms consistent with COVID-19. An individual without symptoms of COVID-19 and who is not shedding SARS-CoV-2 virus would expect to have a negative (not detected) result in this assay. Performed  At: [facility] [address] [place], [place] [id number] Rush Farmer MD FS:[id number]    [place]  Final    Comment: Performed at [facility], [place] [address] [place], [place] [address]         Radiology Studies: No results found.       Scheduled Meds: . feeding supplement (ENSURE ENLIVE)  237 mL Oral BID BM  . pantoprazole  40 mg Oral Daily   Continuous Infusions:   LOS: 1 day     Georgette Shell, MD [facility]  If 7PM-7AM, please contact night-coverage [email or web address] Password [facility] 11/20/2018, 8:54 AM

## 2018-11-20 NOTE — Progress Notes (Signed)
Removed 3rd tick from pt left lower leg with tweezers, no redness or irritation present at site Tick not moving and placed in specimen container at bedside  Will continue to monitor

## 2018-11-20 NOTE — Progress Notes (Addendum)
Called into room by patient because he was feeling something raised up on his neck.  After examination, RN removed a tic from the base of his neck, beginning of shoulders, to the left of his midline.  Tic was still moving in specimen container after removed, head intact.  RN cleaned skin with alcohol and circled spot where tic was removed... area appears red and welped but no bullseye or red rings noted around area.  RN further assessed skin and found a second tic on patients left hip where the skin was bruised and scraped.  This tic was also removed but appeared to be dead already.  The tic did not move in specimen cup once removed, head of tic intact with its body.  The skin on the hip was red and bleeding a little; skin cleansed with alcohol and no signs of bullseye or red rings at this time. Both tics are in a specimen cup at patients bedside.  Will notify MD on call and continue to monitor patient.

## 2018-11-20 NOTE — Progress Notes (Addendum)
Last h/h was at 2349, asked if wanted repeat blood draws Verified orders for CMP and CBC Called phlebotomy technician to inform of orders  Phlebotomist aware and on way to bedside Will continue to monitor

## 2018-11-20 NOTE — Progress Notes (Signed)
Pt finished both bottles of contrast  Informed CT CT stated transport set up for 1330 Pt aware

## 2018-11-20 NOTE — Progress Notes (Signed)
I must say that Mr. Victor Porter did have a nice response to transfusions.  His hemoglobin is 8.4 and his platelet count is 41,000.  As such, I would not think that this is immune-based thrombocytopenia.  With immune based thrombocytopenia, I doubt that his response to platelets would be so robust.  His labs otherwise look pretty good.  He supposed to have a CT scan done today.  We will just have to await the bone marrow biopsy.  Hopefully this will be done on Monday.  This will give [place] the answer as to what might be going on.  Again, I would have to think that we are looking at aplastic anemia or an acute leukemia.  I would not transfuse today.  Lattie Haw, MD  Psalm 30:2

## 2018-11-20 NOTE — Progress Notes (Signed)
Pt assessment and med pass completed with spanish video interpreter  Pt has no questions at this time, pt denies pain, all belongings in reach  Will continue to monitor

## 2018-11-21 LAB — TYPE AND SCREEN
ABO/RH(D): O POS
Antibody Screen: NEGATIVE
Unit division: 0
Unit division: 0
Unit division: 0

## 2018-11-21 LAB — COMPREHENSIVE METABOLIC PANEL
ALT: 20 U/L (ref 0–44)
AST: 27 U/L (ref 15–41)
Albumin: 3.6 g/dL (ref 3.5–5.0)
Alkaline Phosphatase: 64 U/L (ref 38–126)
Anion gap: 7 (ref 5–15)
BUN: 16 mg/dL (ref 6–20)
CO2: 26 mmol/L (ref 22–32)
Calcium: 8.9 mg/dL (ref 8.9–10.3)
Chloride: 103 mmol/L (ref 98–111)
Creatinine, Ser: 0.74 mg/dL (ref 0.61–1.24)
GFR calc Af Amer: >60 mL/min (ref >60)
GFR calc non Af Amer: >60 mL/min (ref >60)
Glucose, Bld: 102 mg/dL — ABNORMAL HIGH (ref 70–99)
Potassium: 3.8 mmol/L (ref 3.5–5.1)
Sodium: 136 mmol/L (ref 135–145)
Total Bilirubin: 1.2 mg/dL (ref 0.3–1.2)
Total Protein: 7.4 g/dL (ref 6.5–8.1)

## 2018-11-21 LAB — CBC
HCT: 25.1 % — ABNORMAL LOW (ref 39.0–52.0)
Hemoglobin: 8.8 g/dL — ABNORMAL LOW (ref 13.0–17.0)
MCH: 33.7 pg (ref 26.0–34.0)
MCHC: 35.1 g/dL (ref 30.0–36.0)
MCV: 96.2 fL (ref 80.0–100.0)
Platelets: 35 10*3/uL — ABNORMAL LOW (ref 150–400)
RBC: 2.61 MIL/uL — ABNORMAL LOW (ref 4.22–5.81)
RDW: 16.1 % — ABNORMAL HIGH (ref 11.5–15.5)
WBC: 3 10*3/uL — ABNORMAL LOW (ref 4.0–10.5)
nRBC: 0 % (ref 0.0–0.2)

## 2018-11-21 LAB — BPAM RBC
Blood Product Expiration Date: 202007102359
Blood Product Expiration Date: 202007102359
Blood Product Expiration Date: 202007132359
ISSUE DATE / TIME: 202006120647
ISSUE DATE / TIME: 202006121703
ISSUE DATE / TIME: 202006130056
Unit Type and Rh: 5100
Unit Type and Rh: 5100
Unit Type and Rh: 5100

## 2018-11-21 NOTE — Progress Notes (Signed)
PROGRESS NOTE    Victor Porter  NFA:[id number] DOB: 05-Aug-1960 DOA: 11/19/2018 PCP: Patient, No Pcp Per  Brief Narrative: 58 y.o.Spanish-speaking malewithoutknown past medical; who presents with complaints of being progressively more tired over the last month. Spanish interpreter services utilized. [place] His activity has been limited because of becoming easily fatigued. As much as he can remember he is always easily bruised, but never to this extent. He has bruises all over his body including head, tongue/mouth, upper, and lower remedies. Denies having any significant falls or trauma to cause the bruising. Complains of having a hard nodule where each bruises and is painful. Associated symptoms include unwarranted weight loss of approximately 10 kg over 3 months, bleeding gums, and belching after meals. He denies having any significant fever, chest pain, shortness of breath, dysuria, nausea, vomiting, or diarrhea. Patient has no primary care provider and has not received regular healthcare. He last had a few beers a couple months ago. He does not routinely drink alcohol or smoke any tobacco. His father passed away in Jul 31, 2004 at the age of 52, and had similar symptoms. However, he reports that they were not told what caused his death. Of note he does report that he has been using herbalifesupplements includingteas, formula 1,and some there other products.  The patient sister reports that their father died from stomach cancer and had been losing weight and had blood in his stools.  ED Course:On admission into the emergency department patient was found to be afebrile, pulse 57-76, respirations 14-25, and all other vital signs maintained. Labs revealed WBC 3.1, hemoglobin 5.8 with elevated MCV and MCH, platelets <5, BUN 21, creatinine 0.92, LFTs wnl,total bilirubin 1.4, LDH 298, iron 140, TIBC 162, andferritin 624.Patient was ordered  to be transfused 2 units of packed red blood cells. Patient was accepted to a progressive bed as inpatient.   11/21/2018 he feels better nurses took out 3 ticks from different parts of his body.  Spoke to him through an interpreter.  He denies going for hiking.  However he sat outside in the patio where there is a lot of chickens and small animals move moving around.  Assessment & Plan:   Principal Problem:   Pancytopenia ([place]) Active Problems:   Weight loss   Hypokalemia   Hyperbilirubinemia  #1 pancytopenia of unclear etiology patient presented with complaints of generalized weakness easy bruising bleeding gums and weight loss.  Upon admission his white count was 3.1 hemoglobin 5.8 platelet count less than 5.  B12 iron and folate level normal  LDL 298 d-dimer 1.63 fibrinogen 317 reticulocyte count normal.  Patient so far has received 3 units of packed RBCs.  Has gone up to 8.8 white count is 3.0 and platelet count is 35 this morning. Hepatitis C viral antibody negative HIV negative coronavirus negative.  Patient scheduled for bone marrow biopsy on Monday.  CT scan abdomen no evidence of cirrhosis normal spleen volume no ascites.  RN removed 3 ticks from his body. ?  Ehrlichiosis babesiosis presenting with pancytopenia  DVT prophylaxis:SCDs Code Status:Full Family Communication patient did not wanted me to call any of his family members.   Disposition Plan: Pending clinical improvement Consults called:Hematology     Nutrition Problem: Unintentional weight loss  Estimated body mass index is 25.1 kg/m as calculated from the following:   Height as of this encounter: _0  (1.753 m).   Weight as of this encounter: 77.1 kg.   Subjective:  Resting  in bed talking to family on iPhone feels excellent though waiting for bone marrow biopsy Objective: Vitals:   11/20/18 1948 11/20/18 2344 11/21/18 0507 11/21/18 0700  BP: 124/79 98/74 117/72 124/84  Pulse: 70 78 66 68  Resp: _0 Temp: 99 F (37.2 C) 98.8 F (37.1 C) 98.9 F (37.2 C) 97.7 F (36.5 C)  TempSrc: Oral Oral Oral Oral  SpO2: 100% 100% 99% 100%  Weight:      Height:        Intake/Output Summary (Last 24 hours) at 11/21/2018 0957 Last data filed at 11/21/2018 0900 Gross per 24 hour  Intake 1240 ml  Output 5175 ml  Net -3935 ml   Filed Weights   11/19/18 0411  Weight: 77.1 kg    Examination:  General exam: Appears calm and comfortable  Respiratory system: Clear to auscultation. Respiratory effort normal. Cardiovascular system: S1 & S2 heard, RRR. No JVD, murmurs, rubs, gallops or clicks. No pedal edema. Gastrointestinal system: Abdomen is nondistended, soft and nontender. No organomegaly or masses felt. Normal bowel sounds heard. Central nervous system: Alert and oriented. No focal neurological deficits. Extremities: Symmetric 5 x 5 power. Skin: No rashes, lesions or ulcers Psychiatry: Judgement and insight appear normal. Mood & affect appropriate.     Data Reviewed: I have personally reviewed following labs and imaging studies  CBC: Recent Labs  Lab 11/19/18 0420 11/19/18 0625 11/19/18 2349 11/20/18 1046 11/21/18 0725  WBC 3.1*  --   --  3.1* 3.0*  NEUTROABS 1.1*  --   --   --   --   HGB 5.8*  --  6.9* 8.4* 8.8*  HCT 17.2*  --  19.6* 24.5* 25.1*  MCV 103.0*  --   --  96.8 96.2  PLT <5* <5*  --  41* 35*   Basic Metabolic Panel: Recent Labs  Lab 11/19/18 0432 11/20/18 1046 11/21/18 0725  NA 137 138 136  K 3.4* 4.0 3.8  CL 108 108 103  CO2 _1 GLUCOSE 107* 99 102*  BUN 21* 15 16  CREATININE 0.92 0.72 0.74  CALCIUM 8.6* 8.7* 8.9   GFR: Estimated Creatinine Clearance: 103.1 mL/min (by C-G formula based on SCr of 0.74 mg/dL). Liver Function Tests: Recent Labs  Lab 11/19/18 0432 11/20/18 1046 11/21/18 0725  AST 38 29 27  ALT _2 ALKPHOS 52 57 64  BILITOT 1.4* 1.3* 1.2  PROT 7.3 6.7 7.4  ALBUMIN 3.7 3.2* 3.6   No results for input(s):  LIPASE, AMYLASE in the last 168 hours. No results for input(s): AMMONIA in the last 168 hours. Coagulation Profile: Recent Labs  Lab 11/19/18 0420 11/19/18 0625  INR 1.1 1.0   Cardiac Enzymes: No results for input(s): CKTOTAL, CKMB, CKMBINDEX, TROPONINI in the last 168 hours. BNP (last 3 results) No results for input(s): PROBNP in the last 8760 hours. HbA1C: No results for input(s): HGBA1C in the last 72 hours. CBG: No results for input(s): GLUCAP in the last 168 hours. Lipid Profile: No results for input(s): CHOL, HDL, LDLCALC, TRIG, CHOLHDL, LDLDIRECT in the last 72 hours. Thyroid Function Tests: No results for input(s): TSH, T4TOTAL, FREET4, T3FREE, THYROIDAB in the last 72 hours. Anemia Panel: Recent Labs    11/19/18 0625  VITAMINB12 299  FOLATE 21.8  FERRITIN 624*  TIBC 162*  IRON 140  RETICCTPCT 1.3   Sepsis Labs: No results for input(s): PROCALCITON, LATICACIDVEN in the last 168 hours.  Recent Results (from  the past 240 hour(s))  Novel Coronavirus, NAA (hospital order; send-out to ref lab)     Status: None   Collection Time: 11/19/18  5:58 AM   Specimen: Nasopharyngeal Swab; Respiratory  Result Value Ref Range Status   SARS-CoV-2, NAA NOT DETECTED NOT DETECTED Final    Comment: (NOTE) This test was developed and its performance characteristics determined by [employer]. This test has not been FDA cleared or approved. This test has been authorized by FDA under an Emergency Use Authorization (EUA). This test is only authorized for the duration of time the declaration that circumstances exist justifying the authorization of the emergency use of in vitro diagnostic tests for detection of SARS-CoV-2 virus and/or diagnosis of COVID-19 infection under section 564(b)(1) of the Act, 21 U.S.C. 950DTO-6(Z)(1), unless the authorization is terminated or revoked sooner. When diagnostic testing is negative, the possibility of a false negative result should be  considered in the context of a patient's recent exposures and the presence of clinical signs and symptoms consistent with COVID-19. An individual without symptoms of COVID-19 and who is not shedding SARS-CoV-2 virus would expect to have a negative (not detected) result in this assay. Performed  At: [facility] [address] [place], [place] [id number] Rush Farmer MD JA:[id number]    [place]  Final    Comment: Performed at [facility], [place] [address], [place], [place] [address]         Radiology Studies: Dg Chest 2 View  Result Date: 11/20/2018 CLINICAL DATA:  Pancytopenia EXAM: CHEST - 2 VIEW COMPARISON:  None. FINDINGS: The heart, hila, mediastinum, lungs, and pleura are normal. Mild prominence of the pleural stripe laterally on the left. Blunting of the costophrenic angle on the lateral view and probable fluid in 1 of the fissures on the lateral view. The lungs are otherwise clear. No other acute abnormalities. IMPRESSION: Mild prominence of the pleural stripe laterally on the left, blunting of the costophrenic angle on the lateral view, and probable fluid in a fissure on the lateral view may all represent a small amount of pleural fluid. No other abnormalities. Recommend follow-up to resolution. Electronically Signed   By: Dorise Bullion III M.D   On: 11/20/2018 12:40   Ct Abdomen Pelvis W Contrast  Result Date: 11/20/2018 CLINICAL DATA:  Cirrhosis or Fatty Liver pancytopenia -- evaluate for large liver or spleen EXAM: CT ABDOMEN AND PELVIS WITH CONTRAST TECHNIQUE: Multidetector CT imaging of the abdomen and pelvis was performed using the standard protocol following bolus administration of intravenous contrast. CONTRAST:  136m OMNIPAQUE IOHEXOL 300 MG/ML  SOLN COMPARISON:  None. FINDINGS: Lower chest: Lung bases are clear. Hepatobiliary: Liver has a normal contour. Simple cysts in the RIGHT hepatic lobe. No biliary duct dilatation.  Gallbladder normal. Pancreas: Pancreas is normal. No ductal dilatation. No pancreatic inflammation. Spleen: Normal spleen. Spleen measures 7.4 cm in craniocaudad dimension. Adrenals/urinary tract: Adrenal glands normal. Simple fluid cyst extending from the RIGHT kidney. No ureteral obstruction. Bladder normal Stomach/Bowel: Stomach, small bowel, appendix, and cecum are normal. The colon and rectosigmoid colon are normal. Vascular/Lymphatic: Abdominal aorta is normal caliber with atherosclerotic calcification. There is no retroperitoneal or periportal lymphadenopathy. Abdominal aorta is normal caliber. There is no retroperitoneal or periportal lymphadenopathy. No pelvic lymphadenopathy. Reproductive: Prostate normal. Other: No free fluid. Musculoskeletal: No aggressive osseous lesion. Endplate compression deformity at T12 with Schmorl's node. IMPRESSION: 1. No CT evidence of cirrhosis. 2. Normal spleen volume. 3. No ascites. Electronically Signed  By: Suzy Bouchard M.D.   On: 11/20/2018 16:52        Scheduled Meds: . feeding supplement (ENSURE ENLIVE)  237 mL Oral BID BM  . multivitamin with minerals  1 tablet Oral Daily  . pantoprazole  40 mg Oral Daily   Continuous Infusions:   LOS: 2 days     Georgette Shell, MD [facility]  If 7PM-7AM, please contact night-coverage [email or web address] Password [facility] 11/21/2018, 9:57 AM

## 2018-11-22 ENCOUNTER — Other Ambulatory Visit: Payer: Self-pay | Admitting: Radiology

## 2018-11-22 ENCOUNTER — Inpatient Hospital Stay (HOSPITAL_COMMUNITY): Payer: Self-pay

## 2018-11-22 LAB — CBC WITH DIFFERENTIAL/PLATELET
Abs Immature Granulocytes: 0 10*3/uL (ref 0.00–0.07)
Basophils Absolute: 0 10*3/uL (ref 0.0–0.1)
Basophils Relative: 0 %
Eosinophils Absolute: 0 10*3/uL (ref 0.0–0.5)
Eosinophils Relative: 2 %
HCT: 24.3 % — ABNORMAL LOW (ref 39.0–52.0)
Hemoglobin: 8.4 g/dL — ABNORMAL LOW (ref 13.0–17.0)
Immature Granulocytes: 0 %
Lymphocytes Relative: 43 %
Lymphs Abs: 1.2 10*3/uL (ref 0.7–4.0)
MCH: 33.2 pg (ref 26.0–34.0)
MCHC: 34.6 g/dL (ref 30.0–36.0)
MCV: 96 fL (ref 80.0–100.0)
Monocytes Absolute: 0.2 10*3/uL (ref 0.1–1.0)
Monocytes Relative: 9 %
Neutro Abs: 1.3 10*3/uL — ABNORMAL LOW (ref 1.7–7.7)
Neutrophils Relative %: 46 %
Platelets: 23 10*3/uL — CL (ref 150–400)
RBC: 2.53 MIL/uL — ABNORMAL LOW (ref 4.22–5.81)
RDW: 15.3 % (ref 11.5–15.5)
WBC: 2.7 10*3/uL — ABNORMAL LOW (ref 4.0–10.5)
nRBC: 0 % (ref 0.0–0.2)

## 2018-11-22 LAB — CBC
HCT: 25.3 % — ABNORMAL LOW (ref 39.0–52.0)
Hemoglobin: 8.9 g/dL — ABNORMAL LOW (ref 13.0–17.0)
MCH: 33.7 pg (ref 26.0–34.0)
MCHC: 35.2 g/dL (ref 30.0–36.0)
MCV: 95.8 fL (ref 80.0–100.0)
Platelets: 26 10*3/uL — CL (ref 150–400)
RBC: 2.64 MIL/uL — ABNORMAL LOW (ref 4.22–5.81)
RDW: 15.3 % (ref 11.5–15.5)
WBC: 4.2 10*3/uL (ref 4.0–10.5)
nRBC: 0 % (ref 0.0–0.2)

## 2018-11-22 LAB — PROTIME-INR
INR: 1.1 (ref 0.8–1.2)
Prothrombin Time: 13.6 seconds (ref 11.4–15.2)

## 2018-11-22 MED ORDER — FENTANYL CITRATE (PF) 100 MCG/2ML IJ SOLN
INTRAMUSCULAR | Status: AC
  Filled 2018-11-22: qty 2

## 2018-11-22 MED ORDER — SODIUM CHLORIDE 0.9 % IV SOLN
INTRAVENOUS | Status: DC
  Administered 2018-11-25: 1 mL via INTRAVENOUS
  Administered 2018-12-03: 11:00:00 200 mL via INTRAVENOUS

## 2018-11-22 MED ORDER — LIDOCAINE HCL 1 % IJ SOLN
INTRAMUSCULAR | Status: AC
  Filled 2018-11-22: qty 20

## 2018-11-22 MED ORDER — MIDAZOLAM HCL 2 MG/2ML IJ SOLN
INTRAMUSCULAR | Status: AC | PRN
  Administered 2018-11-22 (×2): 1 mg via INTRAVENOUS

## 2018-11-22 MED ORDER — MIDAZOLAM HCL 2 MG/2ML IJ SOLN
INTRAMUSCULAR | Status: AC
  Filled 2018-11-22: qty 2

## 2018-11-22 MED ORDER — FENTANYL CITRATE (PF) 100 MCG/2ML IJ SOLN
INTRAMUSCULAR | Status: AC | PRN
  Administered 2018-11-22: 25 ug via INTRAVENOUS
  Administered 2018-11-22: 50 ug via INTRAVENOUS
  Administered 2018-11-22: 25 ug via INTRAVENOUS

## 2018-11-22 NOTE — Progress Notes (Signed)
PROGRESS NOTE    Victor Porter  WCH:[id number] DOB: Sep 02, 1960 DOA: 11/19/2018 PCP: Patient, No Pcp Per   Brief Narrative: 58 y.o.Spanish-speaking malewithoutknown past medical; who presents with complaints of being progressively more tired over the last month. Spanish interpreter services utilized. [place] His activity has been limited because of becoming easily fatigued. As much as he can remember he is always easily bruised, but never to this extent. He has bruises all over his body including head, tongue/mouth, upper, and lower remedies. Denies having any significant falls or trauma to cause the bruising. Complains of having a hard nodule where each bruises and is painful. Associated symptoms include unwarranted weight loss of approximately 10 kg over 3 months, bleeding gums, and belching after meals. He denies having any significant fever, chest pain, shortness of breath, dysuria, nausea, vomiting, or diarrhea. Patient has no primary care provider and has not received regular healthcare. He last had a few beers a couple months ago. He does not routinely drink alcohol or smoke any tobacco. His father passed away in 2004-07-28 at the age of 63, and had similar symptoms. However, he reports that they were not told what caused his death. Of note he does report that he has been using herbalifesupplements includingteas, formula 1,and some there other products.  The patient sister reports that their father died from stomach cancer and had been losing weight and had blood in his stools.  ED Course:On admission into the emergency department patient was found to be afebrile, pulse 57-76, respirations 14-25, and all other vital signs maintained. Labs revealed WBC 3.1, hemoglobin 5.8 with elevated MCV and MCH, platelets <5, BUN 21, creatinine 0.92, LFTs wnl,total bilirubin 1.4, LDH 298, iron 140, TIBC 162, andferritin 624.Patient was  ordered to be transfused 2 units of packed red blood cells. Patient was accepted to a progressive bed as inpatient.   Assessment & Plan:   Principal Problem:   Pancytopenia ([place]) Active Problems:   Weight loss   Hypokalemia   Hyperbilirubinemia   #1 pancytopenia of unclear etiology patient presented with complaints of generalized weaknesseasy bruising bleeding gums and weight loss. Upon admission his white count was 3.1 hemoglobin 5.8 platelet count less than 5. B12 iron and folate level normal LDL 298 d-dimer 1.63 fibrinogen 317 reticulocyte count normal. Patient so far has received 3 units of packed RBCs.  Hb to 8.9 white count is 4.2 and platelet count is 26  this morning.Hepatitis C viral antibody negative HIV negative coronavirus negative. Patient had bone marrow biopsy done 11/22/2018 scheduled for bone marrow biopsy on Monday. CT scan abdomen no evidence of cirrhosis normal spleen volume no ascites.  RN removed 3 ticks from his body. ?  Ehrlichiosis babesiosis presenting with pancytopenia.  Antibody to ehrlichiosis sent.  DVT prophylaxis:SCDs Code Status:Full Family Communicationpatient did not wanted me to call any of his family members.  Disposition Plan:Pending clinical improvement Consults called:Hematology  Procedure bone marrow biopsy 11/22/2018    Nutrition Problem: Unintentional weight loss   Estimated body mass index is 25.1 kg/m as calculated from the following:   Height as of this encounter: _0  (1.753 m).   Weight as of this encounter: 77.1 kg.     Subjective:  Resting in bed no complaints states he feels excellent Objective: Vitals:   11/22/18 0940 11/22/18 0945 11/22/18 0950 11/22/18 0955  BP: 118/80 110/80 110/69 104/71  Pulse: 63 65 63 63  Resp: (!) _1 Temp:  TempSrc:      SpO2: 100% 99% 100% 100%  Weight:      Height:        Intake/Output Summary (Last 24 hours) at 11/22/2018 1026 Last data filed at 11/22/2018  0340 Gross per 24 hour  Intake 300 ml  Output 1800 ml  Net -1500 ml   Filed Weights   11/19/18 0411  Weight: 77.1 kg    Examination:  General exam: Appears calm and comfortable  Respiratory system: Clear to auscultation. Respiratory effort normal. Cardiovascular system: S1 & S2 heard, RRR. No JVD, murmurs, rubs, gallops or clicks. No pedal edema. Gastrointestinal system: Abdomen is nondistended, soft and nontender. No organomegaly or masses felt. Normal bowel sounds heard. Central nervous system: Alert and oriented. No focal neurological deficits. Extremities: Symmetric 5 x 5 power. Skin: No rashes, lesions or ulcers Psychiatry: Judgement and insight appear normal. Mood & affect appropriate.     Data Reviewed: I have personally reviewed following labs and imaging studies  CBC: Recent Labs  Lab 11/19/18 0420 11/19/18 0625 11/19/18 2349 11/20/18 1046 11/21/18 0725 11/22/18 0559  WBC 3.1*  --   --  3.1* 3.0* 4.2  NEUTROABS 1.1*  --   --   --   --   --   HGB 5.8*  --  6.9* 8.4* 8.8* 8.9*  HCT 17.2*  --  19.6* 24.5* 25.1* 25.3*  MCV 103.0*  --   --  96.8 96.2 95.8  PLT <5* <5*  --  41* 35* 26*   Basic Metabolic Panel: Recent Labs  Lab 11/19/18 0432 11/20/18 1046 11/21/18 0725  NA 137 138 136  K 3.4* 4.0 3.8  CL 108 108 103  CO2 _0 GLUCOSE 107* 99 102*  BUN 21* 15 16  CREATININE 0.92 0.72 0.74  CALCIUM 8.6* 8.7* 8.9   GFR: Estimated Creatinine Clearance: 103.1 mL/min (by C-G formula based on SCr of 0.74 mg/dL). Liver Function Tests: Recent Labs  Lab 11/19/18 0432 11/20/18 1046 11/21/18 0725  AST 38 29 27  ALT _1 ALKPHOS 52 57 64  BILITOT 1.4* 1.3* 1.2  PROT 7.3 6.7 7.4  ALBUMIN 3.7 3.2* 3.6   No results for input(s): LIPASE, AMYLASE in the last 168 hours. No results for input(s): AMMONIA in the last 168 hours. Coagulation Profile: Recent Labs  Lab 11/19/18 0420 11/19/18 0625  INR 1.1 1.0   Cardiac Enzymes: No results for  input(s): CKTOTAL, CKMB, CKMBINDEX, TROPONINI in the last 168 hours. BNP (last 3 results) No results for input(s): PROBNP in the last 8760 hours. HbA1C: No results for input(s): HGBA1C in the last 72 hours. CBG: No results for input(s): GLUCAP in the last 168 hours. Lipid Profile: No results for input(s): CHOL, HDL, LDLCALC, TRIG, CHOLHDL, LDLDIRECT in the last 72 hours. Thyroid Function Tests: No results for input(s): TSH, T4TOTAL, FREET4, T3FREE, THYROIDAB in the last 72 hours. Anemia Panel: No results for input(s): VITAMINB12, FOLATE, FERRITIN, TIBC, IRON, RETICCTPCT in the last 72 hours. Sepsis Labs: No results for input(s): PROCALCITON, LATICACIDVEN in the last 168 hours.  Recent Results (from the past 240 hour(s))  Novel Coronavirus, NAA (hospital order; send-out to ref lab)     Status: None   Collection Time: 11/19/18  5:58 AM   Specimen: Nasopharyngeal Swab; Respiratory  Result Value Ref Range Status   SARS-CoV-2, NAA NOT DETECTED NOT DETECTED Final    Comment: (NOTE) This test was developed and its performance characteristics determined by [employer].  This test has not been FDA cleared or approved. This test has been authorized by FDA under an Emergency Use Authorization (EUA). This test is only authorized for the duration of time the declaration that circumstances exist justifying the authorization of the emergency use of in vitro diagnostic tests for detection of SARS-CoV-2 virus and/or diagnosis of COVID-19 infection under section 564(b)(1) of the Act, 21 U.S.C. 706CBJ-6(E)(8), unless the authorization is terminated or revoked sooner. When diagnostic testing is negative, the possibility of a false negative result should be considered in the context of a patient's recent exposures and the presence of clinical signs and symptoms consistent with COVID-19. An individual without symptoms of COVID-19 and who is not shedding SARS-CoV-2 virus would expect to have a  negative (not detected) result in this assay. Performed  At: [facility] [address] [place], [place] [id number] Rush Farmer MD VP:[id number]    [place]  Final    Comment: Performed at [facility], [place] [address] [place], [place] [address]         Radiology Studies: Ct Abdomen Pelvis W Contrast  Result Date: 11/20/2018 CLINICAL DATA:  Cirrhosis or Fatty Liver pancytopenia -- evaluate for large liver or spleen EXAM: CT ABDOMEN AND PELVIS WITH CONTRAST TECHNIQUE: Multidetector CT imaging of the abdomen and pelvis was performed using the standard protocol following bolus administration of intravenous contrast. CONTRAST:  14m OMNIPAQUE IOHEXOL 300 MG/ML  SOLN COMPARISON:  None. FINDINGS: Lower chest: Lung bases are clear. Hepatobiliary: Liver has a normal contour. Simple cysts in the RIGHT hepatic lobe. No biliary duct dilatation. Gallbladder normal. Pancreas: Pancreas is normal. No ductal dilatation. No pancreatic inflammation. Spleen: Normal spleen. Spleen measures 7.4 cm in craniocaudad dimension. Adrenals/urinary tract: Adrenal glands normal. Simple fluid cyst extending from the RIGHT kidney. No ureteral obstruction. Bladder normal Stomach/Bowel: Stomach, small bowel, appendix, and cecum are normal. The colon and rectosigmoid colon are normal. Vascular/Lymphatic: Abdominal aorta is normal caliber with atherosclerotic calcification. There is no retroperitoneal or periportal lymphadenopathy. Abdominal aorta is normal caliber. There is no retroperitoneal or periportal lymphadenopathy. No pelvic lymphadenopathy. Reproductive: Prostate normal. Other: No free fluid. Musculoskeletal: No aggressive osseous lesion. Endplate compression deformity at T12 with Schmorl's node. IMPRESSION: 1. No CT evidence of cirrhosis. 2. Normal spleen volume. 3. No ascites. Electronically Signed   By: SSuzy BouchardM.D.   On: 11/20/2018 16:52        Scheduled  Meds:  feeding supplement (ENSURE ENLIVE)  237 mL Oral BID BM   fentaNYL       lidocaine       midazolam       multivitamin with minerals  1 tablet Oral Daily   pantoprazole  40 mg Oral Daily   Continuous Infusions:   LOS: 3 days     EGeorgette Shell MD [facility]  If 7PM-7AM, please contact night-coverage [email or web address] Password [facility]6/15/2020, 10:26 AM

## 2018-11-22 NOTE — Consult Note (Signed)
Chief Complaint: Patient was seen in consultation today for Bone marrow biopsy Chief Complaint  Patient presents with   Fatigue   at the request of Dr Pearletha Alfred   Supervising Physician: Markus Daft  Patient Status: [facility] - In-pt  History of Present Illness: Victor Porter is a 58 y.o. male   Fatigue worsening over 1 mo Bruising all over-- knots and pain with bruising -- even mouth/tongue Denies trauma Wt loss over 3 mo Bleeding gums] RN removed 3 ticks from pt while examining him  Father died of cancer-- similar symptoms per pt  Pancytopenia Oncology consult-- asking for BM Bx  History reviewed. No pertinent past medical history.  History reviewed. No pertinent surgical history.  Allergies: Patient has no known allergies.  Medications: Prior to Admission medications   Not on File     Family History  Problem Relation Age of Onset   Stomach cancer Father     Social History   Socioeconomic History   Marital status: Married    Spouse name: Not on file   Number of children: Not on file   Years of education: Not on file   Highest education level: Not on file  Occupational History   Not on file  Social Needs   Financial resource strain: Not on file   Food insecurity    Worry: Not on file    Inability: Not on file   Transportation needs    Medical: Not on file    Non-medical: Not on file  Tobacco Use   Smoking status: Never Smoker   Smokeless tobacco: Never Used  Substance and Sexual Activity   Alcohol use: Never    Frequency: Never   Drug use: Never   Sexual activity: Not on file  Lifestyle   Physical activity    Days per week: Not on file    Minutes per session: Not on file   Stress: Not on file  Relationships   Social connections    Talks on phone: Not on file    Gets together: Not on file    Attends religious service: Not on file    Active member of club or organization: Not on file    Attends meetings of clubs  or organizations: Not on file    Relationship status: Not on file  Other Topics Concern   Not on file  Social History Narrative   Not on file   Review of Systems: A 12 point ROS discussed and pertinent positives are indicated in the HPI above.  All other systems are negative.  Review of Systems  Constitutional: Positive for activity change, appetite change, fatigue and unexpected weight change.  Respiratory: Negative for cough and shortness of breath.   Gastrointestinal: Negative for abdominal pain.  Neurological: Positive for weakness.  Psychiatric/Behavioral: Negative for behavioral problems and confusion.    Vital Signs: BP 134/78 (BP Location: Right Arm)    Pulse 66    Temp 97.7 F (36.5 C) (Oral)    Resp 18    Ht _0  (1.753 m)    Wt 170 lb (77.1 kg)    SpO2 100%    BMI 25.10 kg/m   Physical Exam Vitals signs reviewed.  Cardiovascular:     Rate and Rhythm: Normal rate and regular rhythm.     Heart sounds: Normal heart sounds.  Pulmonary:     Breath sounds: Normal breath sounds.  Abdominal:     General: Bowel sounds are normal.  Musculoskeletal: Normal range  of motion.  Skin:    General: Skin is warm and dry.     Findings: Bruising present.  Neurological:     Mental Status: He is alert and oriented to person, place, and time.  Psychiatric:        Mood and Affect: Mood normal.        Behavior: Behavior normal.        Thought Content: Thought content normal.        Judgment: Judgment normal.     Comments: Consented with Interpreter mahcine     Imaging: Dg Chest 2 View  Result Date: 11/20/2018 CLINICAL DATA:  Pancytopenia EXAM: CHEST - 2 VIEW COMPARISON:  None. FINDINGS: The heart, hila, mediastinum, lungs, and pleura are normal. Mild prominence of the pleural stripe laterally on the left. Blunting of the costophrenic angle on the lateral view and probable fluid in 1 of the fissures on the lateral view. The lungs are otherwise clear. No other acute abnormalities.  IMPRESSION: Mild prominence of the pleural stripe laterally on the left, blunting of the costophrenic angle on the lateral view, and probable fluid in a fissure on the lateral view may all represent a small amount of pleural fluid. No other abnormalities. Recommend follow-up to resolution. Electronically Signed   By: Dorise Bullion III M.D   On: 11/20/2018 12:40   Ct Abdomen Pelvis W Contrast  Result Date: 11/20/2018 CLINICAL DATA:  Cirrhosis or Fatty Liver pancytopenia -- evaluate for large liver or spleen EXAM: CT ABDOMEN AND PELVIS WITH CONTRAST TECHNIQUE: Multidetector CT imaging of the abdomen and pelvis was performed using the standard protocol following bolus administration of intravenous contrast. CONTRAST:  169m OMNIPAQUE IOHEXOL 300 MG/ML  SOLN COMPARISON:  None. FINDINGS: Lower chest: Lung bases are clear. Hepatobiliary: Liver has a normal contour. Simple cysts in the RIGHT hepatic lobe. No biliary duct dilatation. Gallbladder normal. Pancreas: Pancreas is normal. No ductal dilatation. No pancreatic inflammation. Spleen: Normal spleen. Spleen measures 7.4 cm in craniocaudad dimension. Adrenals/urinary tract: Adrenal glands normal. Simple fluid cyst extending from the RIGHT kidney. No ureteral obstruction. Bladder normal Stomach/Bowel: Stomach, small bowel, appendix, and cecum are normal. The colon and rectosigmoid colon are normal. Vascular/Lymphatic: Abdominal aorta is normal caliber with atherosclerotic calcification. There is no retroperitoneal or periportal lymphadenopathy. Abdominal aorta is normal caliber. There is no retroperitoneal or periportal lymphadenopathy. No pelvic lymphadenopathy. Reproductive: Prostate normal. Other: No free fluid. Musculoskeletal: No aggressive osseous lesion. Endplate compression deformity at T12 with Schmorl's node. IMPRESSION: 1. No CT evidence of cirrhosis. 2. Normal spleen volume. 3. No ascites. Electronically Signed   By: SSuzy BouchardM.D.   On:  11/20/2018 16:52    Labs:  CBC: Recent Labs    11/19/18 0420 11/19/18 0625 11/19/18 2349 11/20/18 1046 11/21/18 0725 11/22/18 0559  WBC 3.1*  --   --  3.1* 3.0* 4.2  HGB 5.8*  --  6.9* 8.4* 8.8* 8.9*  HCT 17.2*  --  19.6* 24.5* 25.1* 25.3*  PLT <5* <5*  --  41* 35* 26*    COAGS: Recent Labs    11/19/18 0420 11/19/18 0625  INR 1.1 1.0  APTT  --  30    BMP: Recent Labs    11/19/18 0432 11/20/18 1046 11/21/18 0725  NA 137 138 136  K 3.4* 4.0 3.8  CL 108 108 103  CO2 _0 GLUCOSE 107* 99 102*  BUN 21* 15 16  CALCIUM 8.6* 8.7* 8.9  CREATININE 0.92 0.72 0.74  GFRNONAA >60 >60 >60  GFRAA >60 >60 >60    LIVER FUNCTION TESTS: Recent Labs    11/19/18 0432 11/20/18 1046 11/21/18 0725  BILITOT 1.4* 1.3* 1.2  AST 38 29 27  ALT _0 ALKPHOS 52 57 64  PROT 7.3 6.7 7.4  ALBUMIN 3.7 3.2* 3.6    TUMOR MARKERS: No results for input(s): AFPTM, CEA, CA199, CHROMGRNA in the last 8760 hours.  Assessment and Plan:  Fatigue Wt loss Pancytopenia Scheduled for Bone marrow bx Risks and benefits of BM bx was discussed with the patient and/or patient's family including, but not limited to bleeding, infection, damage to adjacent structures or low yield requiring additional tests.  All of the questions were answered and there is agreement to proceed. Consent signed and in chart.   Thank you for this interesting consult.  I greatly enjoyed meeting Victor Porter and look forward to participating in their care.  A copy of this report was sent to the requesting provider on this date.  Electronically Signed: Lavonia Drafts, PA-C 11/22/2018, 8:16 AM   I spent a total of 20 Minutes    in face to face in clinical consultation, greater than 50% of which was counseling/coordinating care for Bone marrow biopsy

## 2018-11-22 NOTE — TOC Initial Note (Signed)
Transition of Care [facility]) - Initial/Assessment Note    Patient Details  Name: Victor Porter MRN: [id number] Date of Birth: 07-12-1960  Transition of Care [facility]) CM/SW Contact:    Pollie Friar, RN Phone Number: 11/22/2018, 11:50 AM  Clinical Narrative:                 Used interpretor machine to speak with patient. He doesn't have a PCP or insurance. He is interested in attending one of the [facility]. CM will obtain him an appt closer to d/c. Pt can also use [facility] pharmacy for assistance with his medications.  Expected Discharge Plan: Home/Self Care Barriers to Discharge: Continued Medical Work up, Inadequate or no insurance   Patient Goals and CMS Choice        Expected Discharge Plan and Services Expected Discharge Plan: Home/Self Care   Discharge Planning Services: CM Consult                                          Prior Living Arrangements/Services   Lives with:: Siblings(sister and brother in law) Patient language and need for interpreter reviewed:: Yes(pt speaks spanish) Do you feel safe going back to the place where you live?: Yes            Criminal Activity/Legal Involvement Pertinent to Current Situation/Hospitalization: No - Comment as needed  Activities of Daily Living Home Assistive Devices/Equipment: None ADL Screening (condition at time of admission) Patient's cognitive ability adequate to safely complete daily activities?: Yes Is the patient deaf or have difficulty hearing?: No Does the patient have difficulty seeing, even when wearing glasses/contacts?: No Does the patient have difficulty concentrating, remembering, or making decisions?: No Patient able to express need for assistance with ADLs?: Yes Does the patient have difficulty dressing or bathing?: No Independently performs ADLs?: Yes (appropriate for developmental age) Does the patient have difficulty walking or climbing stairs?: No Weakness of Legs: None Weakness of  Arms/Hands: None  Permission Sought/Granted                  Emotional Assessment Appearance:: Appears stated age Attitude/Demeanor/Rapport: Engaged Affect (typically observed): Accepting Orientation: : Oriented to Self, Oriented to Place, Oriented to  Time, Oriented to Situation   Psych Involvement: No (comment)  Admission diagnosis:  Macrocytosis [D75.89] Pancytopenia ([place]) [D61.818] Patient Active Problem List   Diagnosis Date Noted  . Pancytopenia ([place]) 11/19/2018  . Weight loss 11/19/2018  . Hypokalemia 11/19/2018  . Hyperbilirubinemia 11/19/2018   PCP:  Patient, No Pcp Per Pharmacy:   [id number] - [place], [place] [address] [place] [address] Phone: [phone] Fax: [phone]     Social Determinants of Health (SDOH) Interventions    Readmission Risk Interventions No flowsheet data found.

## 2018-11-22 NOTE — Procedures (Signed)
Interventional Radiology Procedure:   Indications: Pancytopenia  Procedure: CT guided bone marrow biopsy  Findings: 2 aspirates and 1 core from right ilium  Complications: None     EBL: Minimal, less than 10 ml  Plan: Bedrest 1 hour.    Shallen Luedke R. Anselm Pancoast, MD  Pager: [phone]

## 2018-11-23 LAB — EHRLICHIA ANTIBODY PANEL
E chaffeensis (HGE) Ab, IgG: NEGATIVE
E chaffeensis (HGE) Ab, IgM: NEGATIVE
E. Chaffeensis (HME) IgM Titer: NEGATIVE
E.Chaffeensis (HME) IgG: NEGATIVE

## 2018-11-23 LAB — CBC
HCT: 25 % — ABNORMAL LOW (ref 39.0–52.0)
Hemoglobin: 8.7 g/dL — ABNORMAL LOW (ref 13.0–17.0)
MCH: 33.3 pg (ref 26.0–34.0)
MCHC: 34.8 g/dL (ref 30.0–36.0)
MCV: 95.8 fL (ref 80.0–100.0)
Platelets: 18 10*3/uL — CL (ref 150–400)
RBC: 2.61 MIL/uL — ABNORMAL LOW (ref 4.22–5.81)
RDW: 14.9 % (ref 11.5–15.5)
WBC: 3.2 10*3/uL — ABNORMAL LOW (ref 4.0–10.5)
nRBC: 0 % (ref 0.0–0.2)

## 2018-11-23 NOTE — Progress Notes (Signed)
Mr. Victor Porter had his bone marrow biopsy yesterday.  Hopefully, there will be a preliminary report today.  And we can at least be able to tell if there is acute leukemia or not.  His blood count still is surprisingly pretty good.  His white cell count is 3.2.  Hemoglobin 8.7.  Platelet count is 18,000.  There is no bleeding.  There is no fever.  Again, this will all come down to what the bone marrow biopsy shows.  We will then be able to figure out how we can help the situation.  Lattie Haw, MD  Psalm 111:4

## 2018-11-23 NOTE — Progress Notes (Signed)
PROGRESS NOTE  Victor Porter PJA:[id number] DOB: [birth year] DOA: 11/19/2018 PCP: Victor Porter, No Pcp Per  Brief History   58 y.o.Victor malewithoutknown past medical; who presents with complaints of being progressively more tired over Victor last month. Spanish interpreter services [place]. [place] Victor activity has been limited because of becoming easily fatigued. As much as he can remember he is always easily bruised, but never to this extent. He has bruises all over Victor body including head, tongue/mouth, upper, and lower remedies. Denies having any significant falls or trauma to cause Victor bruising. Complains of having a hard nodule where each bruises and is painful. Associated symptoms include unwarranted weight loss of approximately 10 kg over 3 months, bleeding gums, and belching after meals. He denies having any significant fever, chest pain, shortness of breath, dysuria, nausea, vomiting, or diarrhea. Victor Porter has no primary care provider and has not received regular healthcare. He last had a few beers a couple months ago. He does not routinely drink alcohol or smoke any tobacco. Victor Porter passed away in August 23, 2004 at Victor age of 78, and had similar symptoms. However, he reports that they were not told what caused Victor death. Of note he does report that he has been using herbalifesupplements includingteas, formula 1,and some there other products.  Victor Porter reports that their Porter died from stomach cancer and had been losing weight and had blood in Victor stools.  Upon admission into Victor emergency department Victor Porter was found to be afebrile, pulse 57-76, respirations 14-25, and all other vital signs maintained. Labs revealed WBC 3.1, hemoglobin 5.8 with elevated MCV and MCH, platelets <5, BUN 21, creatinine 0.92, LFTs wnl,total bilirubin 1.4, LDH 298, iron 140, TIBC 162, andferritin 624.Victor Porter was ordered to be  transfused 2 units of packed red blood cells. Victor Porter was accepted to a progressive bed as inpatient.  Oncology has been consulted and Victor Victor Porter has undergone a Bone Marrow biopsy on 11/22/2018. Results are pending.  Consultants  . Oncology  Procedures  . Bone Marrow Biopsy  Antibiotics   Anti-infectives (From admission, onward)   None    .   Subjective  Victor Victor Porter states that he feels very fatigued and weak. He is complaining of pain in Victor right knee.  Objective   Vitals:  Vitals:   11/23/18 1100 11/23/18 1500  BP: 118/75 116/71  Pulse: 68 75  Resp: (!) 24 15  Temp: 97.8 F (36.6 C) 97.6 F (36.4 C)  SpO2: 100% 100%    Exam:  Constitutional:  . Victor Victor Porter is awake, alert, and oriented x 3. No acute distress. Respiratory:  . CTA bilaterally, no w/r/r.  . Respiratory effort normal. No retractions or accessory muscle use Cardiovascular:  . RRR, no m/r/g . No LE extremity edema   . Normal pedal pulses Abdomen:  . Abdomen appears normal; no tenderness or masses . No hernias . No HSM Musculoskeletal:  . No cyanosis, clubbing or edema. Skin:  . No rashes, lesions, ulcers . Positive for multiple ecchymoses and hematomas all over Victor body. . palpation of skin: no induration or nodules Neurologic:  . CN 2-12 intact . Sensation all 4 extremities intact Psychiatric:  . Mental status o Mood, affect appropriate o Orientation to person, place, time  . judgment and insight appear intact     I have personally reviewed Victor following:   Today's Data  . CBC, Vitals.  Scheduled Meds: . feeding supplement (ENSURE ENLIVE)  237 mL Oral  BID BM  . multivitamin with minerals  1 tablet Oral Daily  . pantoprazole  40 mg Oral Daily   Continuous Infusions: . sodium chloride      Principal Problem:   Pancytopenia (HCC) Active Problems:   Weight loss   Hypokalemia   Hyperbilirubinemia   LOS: 4 days    A & P   Pancytopenia of unclear etiology: Victor   Victor Porter presented with complaints of generalized weaknesseasy bruising bleeding gums and weight loss. Upon admission Victor white count was 3.1 hemoglobin 5.8 platelet count less than 5. B12 iron and folate level normal LDL 298 d-dimer 1.63 fibrinogen 317 reticulocyte count normal. Victor Porter so far has received 3 units of packed RBCs.Hb to 8.9 white count is 4.2 and platelet count is 26  this morning.Hepatitis C viral antibody negative HIV negative coronavirus negative. Victor Porter had bone marrow biopsy done 11/22/2018. Results are pending. CT scan abdomenno evidence of cirrhosis normal spleen volume no ascites. RN removed 3 ticks from Victor body. Ehrlichiosis babesiosis presenting with pancytopenia.  Antibody to ehrlichiosis sent and are negative.  Hypokalemia: Monitor and supplement as necessary.  Hyperbilirubinemia: Likely due to hemolysis in hematomas. Monitor LFT's.   Unexplained weight loss: Noted. Nutrition consult.  I have seen and examined this Victor Porter myself. I have spent 35 minutes in Victor evaluation and care.  DVT prophylaxis:SCDs Code Status:Full Family Communicationpatient did not wanted me to call any of Victor family members.  Disposition Plan:Pending clinical improvement  Victor Blackley, DO [facility] Direct contact: see [email or web address]  7PM-7AM contact night coverage as above 11/23/2018, 7:14 PM  LOS: 4 days

## 2018-11-24 LAB — COMPREHENSIVE METABOLIC PANEL
ALT: 19 U/L (ref 0–44)
AST: 22 U/L (ref 15–41)
Albumin: 3.4 g/dL — ABNORMAL LOW (ref 3.5–5.0)
Alkaline Phosphatase: 63 U/L (ref 38–126)
Anion gap: 8 (ref 5–15)
BUN: 18 mg/dL (ref 6–20)
CO2: 25 mmol/L (ref 22–32)
Calcium: 8.7 mg/dL — ABNORMAL LOW (ref 8.9–10.3)
Chloride: 102 mmol/L (ref 98–111)
Creatinine, Ser: 0.79 mg/dL (ref 0.61–1.24)
GFR calc Af Amer: >60 mL/min (ref >60)
GFR calc non Af Amer: >60 mL/min (ref >60)
Glucose, Bld: 106 mg/dL — ABNORMAL HIGH (ref 70–99)
Potassium: 4.2 mmol/L (ref 3.5–5.1)
Sodium: 135 mmol/L (ref 135–145)
Total Bilirubin: 0.8 mg/dL (ref 0.3–1.2)
Total Protein: 6.9 g/dL (ref 6.5–8.1)

## 2018-11-24 LAB — CBC WITH DIFFERENTIAL/PLATELET
Abs Immature Granulocytes: 0 10*3/uL (ref 0.00–0.07)
Basophils Absolute: 0 10*3/uL (ref 0.0–0.1)
Basophils Relative: 0 %
Eosinophils Absolute: 0.1 10*3/uL (ref 0.0–0.5)
Eosinophils Relative: 2 %
HCT: 24.7 % — ABNORMAL LOW (ref 39.0–52.0)
Hemoglobin: 8.5 g/dL — ABNORMAL LOW (ref 13.0–17.0)
Immature Granulocytes: 0 %
Lymphocytes Relative: 56 %
Lymphs Abs: 1.7 10*3/uL (ref 0.7–4.0)
MCH: 33.3 pg (ref 26.0–34.0)
MCHC: 34.4 g/dL (ref 30.0–36.0)
MCV: 96.9 fL (ref 80.0–100.0)
Monocytes Absolute: 0.2 10*3/uL (ref 0.1–1.0)
Monocytes Relative: 7 %
Neutro Abs: 1.1 10*3/uL — ABNORMAL LOW (ref 1.7–7.7)
Neutrophils Relative %: 35 %
Platelets: 12 10*3/uL — CL (ref 150–400)
RBC: 2.55 MIL/uL — ABNORMAL LOW (ref 4.22–5.81)
RDW: 14.9 % (ref 11.5–15.5)
WBC: 3 10*3/uL — ABNORMAL LOW (ref 4.0–10.5)
nRBC: 0 % (ref 0.0–0.2)

## 2018-11-24 NOTE — Progress Notes (Signed)
PROGRESS NOTE  Victor Porter WUJ:[id number] DOB: Apr 16, 1961 DOA: 11/19/2018 PCP: Patient, No Pcp Per  Brief History   58 y.o.Spanish-speaking malewithoutknown past medical; who presents with complaints of being progressively more tired over the last month. Spanish interpreter services utilized. [place]. His activity has been limited because of becoming easily fatigued. As much as he can remember he is always easily bruised, but never to this extent. He has bruises all over his body including head, tongue/mouth, upper, and lower remedies. Denies having any significant falls or trauma to cause the bruising. Complains of having a hard nodule where each bruises and is painful. Associated symptoms include unwarranted weight loss of approximately 10 kg over 3 months, bleeding gums, and belching after meals. He denies having any significant fever, chest pain, shortness of breath, dysuria, nausea, vomiting, or diarrhea. Patient has no primary care provider and has not received regular healthcare. He last had a few beers a couple months ago. He does not routinely drink alcohol or smoke any tobacco. His father passed away in 08/17/04 at the age of 54, and had similar symptoms. However, he reports that they were not told what caused his death. Of note he does report that he has been using herbalifesupplements includingteas, formula 1,and some there other products.  The patient sister reports that their father died from stomach cancer and had been losing weight and had blood in his stools.  Upon admission into the emergency department patient was found to be afebrile, pulse 57-76, respirations 14-25, and all other vital signs maintained. Labs revealed WBC 3.1, hemoglobin 5.8 with elevated MCV and MCH, platelets <5, BUN 21, creatinine 0.92, LFTs wnl,total bilirubin 1.4, LDH 298, iron 140, TIBC 162, andferritin 624.Patient was ordered to be  transfused 2 units of packed red blood cells. Patient was accepted to a progressive bed as inpatient.  Oncology has been consulted and the patient has undergone a Bone Marrow biopsy on 11/22/2018. Results are pending.  Consultants  . Oncology  Procedures  . Bone Marrow Biopsy  Antibiotics   Anti-infectives (From admission, onward)   None      Subjective  The patient is sitting up in his bed. He is complaining of pain in his right knee.  Objective   Vitals:  Vitals:   11/24/18 0701 11/24/18 1319  BP: 120/76   Pulse: 73 84  Resp:  20  Temp: 97.6 F (36.4 C) 98.6 F (37 C)  SpO2: 98%     Exam:  Constitutional:  . The patient is awake, alert, and oriented x 3. No acute distress. Respiratory:  . No wheezes, rales, or rhonchi. . No tactile fremitus. . No increased work of breathing. Cardiovascular:  . Regular rate and rhythm. . No murmurs, ectopy or gallups. . No lateral PMI. No thrills. Abdomen:  . Abdomen is soft, non-tender, non-distended . No hernias, masses, or organomegaly. . Normoactive bowel sounds. Musculoskeletal:  . No cyanosis, clubbing or edema. Skin:  . No rashes, lesions, ulcers . Positive for multiple ecchymoses and hematomas all over the body. . palpation of skin: no induration or nodules Neurologic:  . CN 2-12 intact . Sensation all 4 extremities intact Psychiatric:  . Mental status o Mood, affect appropriate o Orientation to person, place, time  . judgment and insight appear intact     I have personally reviewed the following:   Today's Data  . CBC, Vitals.  Scheduled Meds: . feeding supplement (ENSURE ENLIVE)  237 mL Oral  BID BM  . multivitamin with minerals  1 tablet Oral Daily  . pantoprazole  40 mg Oral Daily   Continuous Infusions: . sodium chloride      Principal Problem:   Pancytopenia (HCC) Active Problems:   Weight loss   Hypokalemia   Hyperbilirubinemia   LOS: 5 days    A & P   Pancytopenia of unclear  etiology: The  patient presented with complaints of generalized weaknesseasy bruising bleeding gums and weight loss. Upon admission his white count was 3.1 hemoglobin 5.8 platelet count less than 5. B12 iron and folate level normal LDL 298 d-dimer 1.63 fibrinogen 317 reticulocyte count normal. Patient so far has received 3 units of packed RBCs.Hb to 8.9 white count is 4.2 and platelet count is 26  this morning.Hepatitis C viral antibody negative HIV negative coronavirus negative. Patient had bone marrow biopsy done 11/22/2018. Results are pending. CT scan abdomenno evidence of cirrhosis normal spleen volume no ascites. RN removed 3 ticks from his body. Ehrlichiosis babesiosis presenting with pancytopenia.  Antibody to ehrlichiosis sent and are negative. I appreciate neurology's assistance.  Hypokalemia: Resolved. Monitor and supplement as necessary.  Hyperbilirubinemia: Likely due to hemolysis in hematomas. Monitor LFT's.   Unexplained weight loss: Noted. Nutrition consult.  I have seen and examined this patient myself. I have spent 32 minutes in his evaluation and care.  DVT prophylaxis:SCDs Code Status:Full Family Communicationpatient did not wanted me to call any of his family members.  Disposition Plan:Pending clinical improvement  Shawnte Demarest, DO [facility] Direct contact: see [email or web address]  7PM-7AM contact night coverage as above  11/24/2018, 3:20 PM  LOS: 4 days

## 2018-11-24 NOTE — Progress Notes (Signed)
Still awaiting the results from the bone marrow biopsy.  His platelet count is dropping slowly but surely.  Platelet count is 12,000 today.  His white cell count also is dropping slowly.  It is 3.0.  His hemoglobin is holding steady.  I think is interesting about the fact that he had ticks removed.  It is possible this might be a tick borne illness.  I saw that he was negative for Erlichiosis.  He really has no other symptoms that I can tell for a tickborne illness.  Again, the bone marrow will clearly be helpful.  We will continue to follow along.  Lattie Haw, MD  Colossians 3:14

## 2018-11-25 MED ORDER — VALACYCLOVIR HCL 500 MG PO TABS
500.0000 mg | ORAL_TABLET | Freq: Every day | ORAL | Status: DC
  Administered 2018-11-25 – 2018-12-04 (×10): 500 mg via ORAL
  Filled 2018-11-25 (×10): qty 1

## 2018-11-25 MED ORDER — DEXAMETHASONE SODIUM PHOSPHATE 4 MG/ML IJ SOLN: 40.0000 mg | INTRAMUSCULAR | Status: DC

## 2018-11-25 MED ORDER — IMMUNE GLOBULIN (HUMAN) 20 GM/200ML IV SOLN: 1.0000 g/kg | INTRAVENOUS | Status: DC

## 2018-11-25 MED ORDER — SODIUM CHLORIDE 0.9 % IV SOLN
40.0000 mg | Freq: Every day | INTRAVENOUS | Status: AC
  Administered 2018-11-25 – 2018-11-28 (×4): 40 mg via INTRAVENOUS
  Filled 2018-11-25 (×4): qty 4

## 2018-11-25 MED ORDER — IMMUNE GLOBULIN (HUMAN) 20 GM/200ML IV SOLN
80.0000 g | INTRAVENOUS | Status: AC
  Administered 2018-11-25 – 2018-11-26 (×2): 80 g via INTRAVENOUS
  Filled 2018-11-25: qty 800
  Filled 2018-11-25: qty 400

## 2018-11-25 MED ORDER — PANTOPRAZOLE SODIUM 40 MG PO TBEC
40.0000 mg | DELAYED_RELEASE_TABLET | Freq: Two times a day (BID) | ORAL | Status: DC
  Administered 2018-11-25 – 2018-12-05 (×21): 40 mg via ORAL
  Filled 2018-11-25 (×21): qty 1

## 2018-11-25 NOTE — Progress Notes (Signed)
PROGRESS NOTE  Victor Porter IFO:[id number] DOB: 01-Oct-1960 DOA: 11/19/2018 PCP: Patient, No Pcp Per  Brief History   58 y.o.Spanish-speaking malewithoutknown past medical; who presents with complaints of being progressively more tired over the last month. Spanish interpreter services [place] His activity has been limited because of becoming easily fatigued. As much as he can remember he is always easily bruised, but never to this extent. He has bruises all over his body including head, tongue/mouth, upper, and lower remedies. Denies having any significant falls or trauma to cause the bruising. Complains of having a hard nodule where each bruises and is painful. Associated symptoms include unwarranted weight loss of approximately 10 kg over 3 months, bleeding gums, and belching after meals. He denies having any significant fever, chest pain, shortness of breath, dysuria, nausea, vomiting, or diarrhea. Patient has no primary care provider and has not received regular healthcare. He last had a few beers a couple months ago. He does not routinely drink alcohol or smoke any tobacco. His father passed away in 08/26/04 at the age of 26, and had similar symptoms. However, he reports that they were not told what caused his death. Of note he does report that he has been using herbalifesupplements includingteas, formula 1,and some there other products.  The patient sister reports that their father died from stomach cancer and had been losing weight and had blood in his stools.  Upon admission into the emergency department patient was found to be afebrile, pulse 57-76, respirations 14-25, and all other vital signs maintained. Labs revealed WBC 3.1, hemoglobin 5.8 with elevated MCV and MCH, platelets <5, BUN 21, creatinine 0.92, LFTs wnl,total bilirubin 1.4, LDH 298, iron 140, TIBC 162, andferritin 624.Patient was ordered to be  transfused 2 units of packed red blood cells. Patient was accepted to a progressive bed as inpatient.  Oncology has been consulted and the patient has undergone a Bone Marrow biopsy on 11/22/2018. This has resulted and is negative for leukemia. 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 Cytogenetics are pending. Consultants  . Oncology  Procedures  . Bone Marrow Biopsy  Antibiotics   Anti-infectives (From admission, onward)   Start     Dose/Rate Route Frequency Ordered Stop   11/25/18 1000  valACYclovir (VALTREX) tablet 500 mg     500 mg Oral Daily 11/25/18 0711        Subjective  The patient is sitting up in his bed. He is complaining of pain in his right knee.  Objective   Vitals:  Vitals:   11/25/18 1120 11/25/18 1200  BP: 101/61 98/61  Pulse: 74 70  Resp: 19 17  Temp: 99 F (37.2 C) 98 F (36.7 C)  SpO2: 100% 99%    Exam:  Constitutional:  . The patient is awake, alert, and oriented x 3. No acute distress. Respiratory:  . No wheezes, rales, or rhonchi. . No tactile fremitus. . No increased work of breathing. Cardiovascular:  . Regular rate and rhythm. . No murmurs, ectopy or gallups. . No lateral PMI. No thrills. Abdomen:  . Abdomen is soft, non-tender, non-distended . No hernias, masses, or organomegaly. . Normoactive bowel sounds. Musculoskeletal:  . No cyanosis, clubbing or edema. Skin:  . No rashes, lesions, ulcers . Positive for multiple ecchymoses and hematomas all over the body. . palpation of skin: no induration or nodules Neurologic:  . CN 2-12 intact . Sensation all 4 extremities intact Psychiatric:  . Mental status  o Mood, affect appropriate o Orientation to person, place, time  . judgment and insight appear intact     I have personally reviewed the following:   Today's Data  . CBC, Vitals.  Scheduled Meds: . feeding supplement (ENSURE ENLIVE)  237 mL Oral BID BM  . multivitamin with minerals  1 tablet  Oral Daily  . pantoprazole  40 mg Oral BID  . valACYclovir  500 mg Oral Daily   Continuous Infusions: . sodium chloride 1 mL (11/25/18 1128)  . dexamethasone (DECADRON) IVPB ([facility]) 40 mg (11/25/18 1130)  . IMMUNE GLOBULIN 10% (HUMAN) IV - For Fluid Restriction Only 80 g (11/25/18 1213)    Principal Problem:   Pancytopenia ([place]) Active Problems:   Weight loss   Hypokalemia   Hyperbilirubinemia   LOS: 6 days    A & P   Pancytopenia of unclear etiology: The  patient presented with complaints of generalized weaknesseasy bruising bleeding gums and weight loss. Upon admission his white count was 3.1 hemoglobin 5.8 platelet count less than 5. B12 iron and folate level normal LDL 298 d-dimer 1.63 fibrinogen 317 reticulocyte count normal. Patient so far has received 3 units of packed RBCs.Hb to 8.9 white count is 4.2 and platelet count is 26  this morning.Hepatitis C viral antibody negative HIV negative coronavirus negative. Patient had bone marrow biopsy done 11/22/2018. Results are pending. CT scan abdomenno evidence of cirrhosis normal spleen volume no ascites. RN removed 3 ticks from his body. Ehrlichiosis babesiosis presenting with pancytopenia.  Antibody to ehrlichiosis sent and are negative. I appreciate oncology's assistance. This has resulted and is negative for leukemia. 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 Cytogenetics are pending. Oncology plans to start IVIG and steroids today.  Hypokalemia: Resolved. Monitor and supplement as necessary.  Hyperbilirubinemia: Likely due to hemolysis in hematomas. Monitor LFT's.   Unexplained weight loss: Noted. Nutrition consult.  I have seen and examined this patient myself. I have spent 30 minutes in his evaluation and care.  DVT prophylaxis:SCDs Code Status:Full Family Communicationpatient did not wanted me to call any of his family members.  Disposition Plan:Pending clinical improvement   Katessa Attridge, DO [facility] Direct contact: see [email or web address]  7PM-7AM contact night coverage as above  11/25/2018, 1:52 PM  LOS: 4 days

## 2018-11-25 NOTE — Progress Notes (Signed)
I got the result from the bone marrow biopsy yesterday afternoon.  This is quite interesting.  He has an absence of megakaryocytes.  He has decreased erythroid and myeloid precursors.  There is NO acute leukemia.  There is some increase in plasma cells.  I suppose this could be insult from a recent exposure.  I know he had the ticks on him.  I do know that tickborne disease could cause some bone marrow failure.  It is also possible that he has may be a myelodysplastic type process.  We will have to await the cytogenetics.  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  In a lot of situations, this requires significant immunosuppressive therapy with cyclosporine and ATG.  I will try him on Decadron and IVIG.  I know there are reports that Rituxan might be able to help.  I had excellent, excellent help from 1 of the techs today.  She was my interpreter.  She really did a fantastic job in interpreting for me such that Victor Porter understands.  I do not have any results back from the CBC yet.  I will try him on 2 days of IVIG and 4 days of high-dose Decadron.  We will see if this can help with his platelet count and maybe improve the megakaryocytic production in his bone marrow.  If we cannot see any improvement with his platelet count, then I think the next step might be transfer over to [facility] for consideration of ATG.  We just do not have the resources to do ATG therapy in [place].  Victor Porter is a really nice guy.  It was fun talking to him through my interpreter.  Again, my interpreter, who was 1 of the nursing techs, really did a amazing job.  I have to give her a ton of credit for being the intermediary for me and Victor Porter.  Victor Porter has quite a few bruises on him.  He also has vitiligo on his hands.  Given that there is a is the vitiligo, one might make a case for some type of autoimmune process here.  I know he is  from [place], [place].  I know he wants to get back there.  Hopefully, we will be able to improve his blood counts with the Decadron and IVIG.  We spent about 35-40 minutes with him today.  Lattie Haw, MD  Proverbs 24:5

## 2018-11-25 NOTE — Progress Notes (Signed)
Nutrition Follow-up   RD working remotely.  DOCUMENTATION CODES:   Not applicable  INTERVENTION:  Continue Ensure Enlive po BID, each supplement provides 350 kcal and 20 grams of protein.  Encourage adequate PO intake.   NUTRITION DIAGNOSIS:   Increased nutrient needs related to acute illness as evidenced by estimated needs; ongoing  GOAL:   Patient will meet greater than or equal to 90% of their needs; met  MONITOR:   PO intake, Supplement acceptance, Labs, Weight trends, Skin, I & O's  REASON FOR ASSESSMENT:   Malnutrition Screening Tool    ASSESSMENT:   58 y/o spanish-speaking male w/o known PMhx. Presented to ED w/ 1 month of progressively worsening fatigue and bruising all over his body. Reports loss of 10kg x3 months, bleeding from gums, belching after eating. Has been in [place] visiting since February. In ED, labs/presentation were concerning for underlying malignancy. Pancytopenic. Admitted for med onc consultation.  Bone marrow biopsy 6/15. Negative for leukemia. Awaiting cytogenetics. Pt with amegakaryocytic thrombocytopenia. Plans to start IVIG and steroids. Noted RN removed 3 ticks from pt body. Pt is currently on a heart diet with thin liquids. Meal completion has been 100%. Pt has been tolerating diet. Pt currently has Ensure ordered and has been consuming them. RD to continue with current orders to aid in increased caloric and protein needs. RD to continue to monitor.   Labs and medications reviewed.   Diet Order:   Diet Order            Diet Heart Room service appropriate? Yes; Fluid consistency: Thin  Diet effective now              EDUCATION NEEDS:   Not appropriate for education at this time  Skin:  Skin Assessment: Reviewed RN Assessment  Last BM:  6/18  Height:   Ht Readings from Last 1 Encounters:  11/19/18 _0  (1.753 m)    Weight:   Wt Readings from Last 1 Encounters:  11/19/18 77.1 kg    Ideal Body Weight:  72.73 kg  BMI:   Body mass index is 25.1 kg/m.  Estimated Nutritional Needs:   Kcal:  1900-2100 (25-27kcal/kg bw)  Protein:  100-115g Pro (1.3-1.5g/kg bw)  Fluid:  1.9-2.1 L fluid (80m/kcal)    SCorrin Parker MS, RD, LDN Pager # [phone] hours/ weekend pager # [phone]

## 2018-11-26 ENCOUNTER — Inpatient Hospital Stay (HOSPITAL_COMMUNITY): Payer: Self-pay

## 2018-11-26 DIAGNOSIS — D696 Thrombocytopenia, unspecified: Secondary | ICD-10-CM

## 2018-11-26 LAB — COMPREHENSIVE METABOLIC PANEL
ALT: 21 U/L (ref 0–44)
AST: 22 U/L (ref 15–41)
Albumin: 3.5 g/dL (ref 3.5–5.0)
Alkaline Phosphatase: 58 U/L (ref 38–126)
Anion gap: 8 (ref 5–15)
BUN: 21 mg/dL — ABNORMAL HIGH (ref 6–20)
CO2: 24 mmol/L (ref 22–32)
Calcium: 8.9 mg/dL (ref 8.9–10.3)
Chloride: 102 mmol/L (ref 98–111)
Creatinine, Ser: 0.79 mg/dL (ref 0.61–1.24)
GFR calc Af Amer: >60 mL/min (ref >60)
GFR calc non Af Amer: >60 mL/min (ref >60)
Glucose, Bld: 132 mg/dL — ABNORMAL HIGH (ref 70–99)
Potassium: 4 mmol/L (ref 3.5–5.1)
Sodium: 134 mmol/L — ABNORMAL LOW (ref 135–145)
Total Bilirubin: 0.7 mg/dL (ref 0.3–1.2)
Total Protein: 8.8 g/dL — ABNORMAL HIGH (ref 6.5–8.1)

## 2018-11-26 LAB — CBC WITH DIFFERENTIAL/PLATELET
Abs Immature Granulocytes: 0.03 10*3/uL (ref 0.00–0.07)
Abs Immature Granulocytes: 0.03 10*3/uL (ref 0.00–0.07)
Basophils Absolute: 0 10*3/uL (ref 0.0–0.1)
Basophils Absolute: 0 10*3/uL (ref 0.0–0.1)
Basophils Relative: 0 %
Basophils Relative: 0 %
Eosinophils Absolute: 0 10*3/uL (ref 0.0–0.5)
Eosinophils Absolute: 0 10*3/uL (ref 0.0–0.5)
Eosinophils Relative: 0 %
Eosinophils Relative: 0 %
HCT: 23.3 % — ABNORMAL LOW (ref 39.0–52.0)
HCT: 19.7 % — ABNORMAL LOW (ref 39.0–52.0)
Hemoglobin: 8.2 g/dL — ABNORMAL LOW (ref 13.0–17.0)
Hemoglobin: 6.8 g/dL — CL (ref 13.0–17.0)
Immature Granulocytes: 1 %
Immature Granulocytes: 1 %
Lymphocytes Relative: 22 %
Lymphocytes Relative: 15 %
Lymphs Abs: 1.2 10*3/uL (ref 0.7–4.0)
Lymphs Abs: 0.6 10*3/uL — ABNORMAL LOW (ref 0.7–4.0)
MCH: 33.9 pg (ref 26.0–34.0)
MCH: 33.3 pg (ref 26.0–34.0)
MCHC: 35.2 g/dL (ref 30.0–36.0)
MCHC: 34.5 g/dL (ref 30.0–36.0)
MCV: 96.3 fL (ref 80.0–100.0)
MCV: 96.6 fL (ref 80.0–100.0)
Monocytes Absolute: 0.3 10*3/uL (ref 0.1–1.0)
Monocytes Absolute: 0.1 10*3/uL (ref 0.1–1.0)
Monocytes Relative: 6 %
Monocytes Relative: 2 %
Neutro Abs: 3.7 10*3/uL (ref 1.7–7.7)
Neutro Abs: 3.5 10*3/uL (ref 1.7–7.7)
Neutrophils Relative %: 71 %
Neutrophils Relative %: 82 %
Platelets: <5 10*3/uL — CL (ref 150–400)
Platelets: 22 10*3/uL — CL (ref 150–400)
RBC: 2.42 MIL/uL — ABNORMAL LOW (ref 4.22–5.81)
RBC: 2.04 MIL/uL — ABNORMAL LOW (ref 4.22–5.81)
RDW: 14.6 % (ref 11.5–15.5)
RDW: 14.8 % (ref 11.5–15.5)
WBC: 5.2 10*3/uL (ref 4.0–10.5)
WBC: 4.3 10*3/uL (ref 4.0–10.5)
nRBC: 0 % (ref 0.0–0.2)
nRBC: 0 % (ref 0.0–0.2)

## 2018-11-26 LAB — LACTATE DEHYDROGENASE: LDH: 177 U/L (ref 98–192)

## 2018-11-26 LAB — PROTEIN ELECTROPHORESIS, SERUM
A/G Ratio: 0.9 (ref 0.7–1.7)
Albumin ELP: 3.5 g/dL (ref 2.9–4.4)
Alpha-1-Globulin: 0.2 g/dL (ref 0.0–0.4)
Alpha-2-Globulin: 0.5 g/dL (ref 0.4–1.0)
Beta Globulin: 0.9 g/dL (ref 0.7–1.3)
Gamma Globulin: 2.1 g/dL — ABNORMAL HIGH (ref 0.4–1.8)
Globulin, Total: 3.7 g/dL (ref 2.2–3.9)
Total Protein ELP: 7.2 g/dL (ref 6.0–8.5)

## 2018-11-26 LAB — PREPARE RBC (CROSSMATCH)

## 2018-11-26 MED ORDER — ACETAMINOPHEN 325 MG PO TABS
650.0000 mg | ORAL_TABLET | Freq: Once | ORAL | Status: AC
  Administered 2018-11-26: 650 mg via ORAL
  Filled 2018-11-26: qty 2

## 2018-11-26 MED ORDER — SODIUM CHLORIDE 0.9% IV SOLUTION
Freq: Once | INTRAVENOUS | Status: AC
  Administered 2018-11-26: 20:00:00 via INTRAVENOUS

## 2018-11-26 MED ORDER — SODIUM CHLORIDE 0.9% IV SOLUTION
Freq: Once | INTRAVENOUS | Status: AC
  Administered 2018-11-26: 16:00:00 via INTRAVENOUS

## 2018-11-26 MED ORDER — DIPHENHYDRAMINE HCL 50 MG/ML IJ SOLN
25.0000 mg | Freq: Once | INTRAMUSCULAR | Status: AC
  Administered 2018-11-26: 25 mg via INTRAVENOUS
  Filled 2018-11-26: qty 1

## 2018-11-26 NOTE — Progress Notes (Signed)
Platelet transfusion started. Consent, tranx rnx reviewed with patient w/ translator. Patient verbalized understanding. Pre-medicated per order. Will continue to monitor.  Ave Filter, RN

## 2018-11-26 NOTE — Progress Notes (Signed)
2nd unit of platelet started with new Y-tubing . Reviewed trnx rxn once again with pt. He verbalized understanding, VSS. Will continue to monitor.   Ave Filter, RN

## 2018-11-26 NOTE — Progress Notes (Signed)
PROGRESS NOTE  Victor Porter MEQ:[id number] DOB: October 01, 1960 DOA: 11/19/2018 PCP: Patient, No Pcp Per  Brief History   58 y.o.Spanish-speaking malewithoutknown past medical; who presents with complaints of being progressively more tired over the last month. Spanish interpreter services utilized[place] His activity has been limited because of becoming easily fatigued. As much as he can remember he is always easily bruised, but never to this extent. He has bruises all over his body including head, tongue/mouth, upper, and lower remedies. Denies having any significant falls or trauma to cause the bruising. Complains of having a hard nodule where each bruises and is painful. Associated symptoms include unwarranted weight loss of approximately 10 kg over 3 months, bleeding gums, and belching after meals. He denies having any significant fever, chest pain, shortness of breath, dysuria, nausea, vomiting, or diarrhea. Patient has no primary care provider and has not received regular healthcare. He last had a few beers a couple months ago. He does not routinely drink alcohol or smoke any tobacco. His father passed away in 08-11-04 at the age of 59, and had similar symptoms. However, he reports that they were not told what caused his death. Of note he does report that he has been using herbalifesupplements includingteas, formula 1,and some there other products.  The patient sister reports that their father died from stomach cancer and had been losing weight and had blood in his stools.  Upon admission into the emergency department patient was found to be afebrile, pulse 57-76, respirations 14-25, and all other vital signs maintained. Labs revealed WBC 3.1, hemoglobin 5.8 with elevated MCV and MCH, platelets <5, BUN 21, creatinine 0.92, LFTs wnl,total bilirubin 1.4, LDH 298, iron 140, TIBC 162, andferritin 624.Patient was ordered to be  transfused 2 units of packed red blood cells. Patient was accepted to a progressive bed as inpatient.  Oncology has been consulted and the patient has undergone a Bone Marrow biopsy on 11/22/2018. This has resulted and is negative for leukemia. 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 Cytogenetics are pending. Consultants  . Oncology  Procedures  . Bone Marrow Biopsy  Antibiotics   Anti-infectives (From admission, onward)   Start     Dose/Rate Route Frequency Ordered Stop   11/25/18 1000  valACYclovir (VALTREX) tablet 500 mg     500 mg Oral Daily 11/25/18 0711        Subjective  The patient is sitting up in his bed. He is complaining of bleeding from his gums and his nose this morning.   Objective   Vitals:  Vitals:   11/26/18 1608 11/26/18 1645  BP: 112/76 111/74  Pulse: 78 62  Resp: 18 18  Temp: 98.2 F (36.8 C) 97.8 F (36.6 C)  SpO2: (!) 88% 100%    Exam:  Constitutional:  . The patient is awake, alert, and oriented x 3. No acute distress. Respiratory:  . No wheezes, rales, or rhonchi. . No tactile fremitus. . No increased work of breathing. Cardiovascular:  . Regular rate and rhythm. . No murmurs, ectopy or gallups. . No lateral PMI. No thrills. Abdomen:  . Abdomen is soft, non-tender, non-distended . No hernias, masses, or organomegaly. . Normoactive bowel sounds. Musculoskeletal:  . No cyanosis, clubbing or edema. Skin:  . No rashes, lesions, ulcers . Positive for multiple ecchymoses and hematomas all over the body. . palpation of skin: no induration or nodules Neurologic:  . CN 2-12 intact . Sensation all 4 extremities  intact Psychiatric:  . Mental status o Mood, affect appropriate o Orientation to person, place, time  . judgment and insight appear intact    ENT: The patient has bleeding from his gums and his nares.   I have personally reviewed the following:   Today's Data  . CBC, Vitals.  Scheduled Meds:  . feeding supplement (ENSURE ENLIVE)  237 mL Oral BID BM  . multivitamin with minerals  1 tablet Oral Daily  . pantoprazole  40 mg Oral BID  . valACYclovir  500 mg Oral Daily   Continuous Infusions: . sodium chloride Stopped (11/25/18 1155)  . dexamethasone (DECADRON) IVPB ([facility]) Stopped (11/26/18 3244)    Principal Problem:   Pancytopenia ([place]) Active Problems:   Weight loss   Hypokalemia   Hyperbilirubinemia   LOS: 7 days    A & P   Pancytopenia of unclear etiology: The  patient presented with complaints of generalized weaknesseasy bruising bleeding gums and weight loss. Upon admission his white count was 3.1 hemoglobin 5.8 platelet count less than 5. B12 iron and folate level normal LDL 298 d-dimer 1.63 fibrinogen 317 reticulocyte count normal. Patient so far has received 3 units of packed RBCs.Hb to 8.9 white count is 4.2 and platelet count is 26  this morning.Hepatitis C viral antibody negative HIV negative coronavirus negative. Patient had bone marrow biopsy done 11/22/2018. Results are pending. CT scan abdomenno evidence of cirrhosis normal spleen volume no ascites. RN removed 3 ticks from his body. Ehrlichiosis babesiosis presenting with pancytopenia.  Antibody to ehrlichiosis sent and are negative. I appreciate oncology's assistance. This has resulted and is negative for leukemia. 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 Cytogenetics are pending. Oncology plans to start IVIG and steroids today.  Severe thrombocytopenia with mucosal bleeding: Transfuse 2 phersis packs. Monitor platelets and bleeding.  Hypokalemia: Resolved. Monitor and supplement as necessary.  Hyperbilirubinemia: Likely due to hemolysis in hematomas. Monitor LFT's.   Unexplained weight loss: Noted. Nutrition consult.  I have seen and examined this patient myself. I have spent 32 minutes in his evaluation and care.  DVT prophylaxis:SCDs Code Status:Full Family  Communicationpatient did not wanted me to call any of his family members.  Disposition Plan:Pending clinical improvement  Arlene Brickel, DO [facility] Direct contact: see [email or web address]  7PM-7AM contact night coverage as above  11/26/2018, 5:08 PM  LOS: 4 days

## 2018-11-26 NOTE — Progress Notes (Signed)
Lab called with critical lab Hgb value of 6.8. MD paged and notified and new orders received. Will continue to closely monitor. Delia Heady RN

## 2018-11-26 NOTE — Progress Notes (Addendum)
So far, Mr. Victor Porter is doing okay with the IVIG and Decadron.  There have been no labs ordered for today.  We will have to see how everything looks.  I really doubt that we would see any improvement, if this treatment works, until next week.  Again, we are awaiting the cytogenetics on the bone marrow test.  I do not think these will come back until next week.  I think will be hard to discharge him given his low platelets and the fact that he has no megakaryocytes in the bone marrow.  This would put him at a significant risk for bleeding.  We will just have to follow his blood counts at this point.  I would be very conservative with transfusions.  I probably would have to give him platelets if his platelet count is less than 10,000.  It would not surprise me if his platelet count was less than 10,000.  He has had no bleeding.  He has scattered ecchymoses.  Again, if this treatment does not work, then I would consider him for transfer to [facility] for ATG therapy with/without cyclosporine.  I think this would be the best way to go for second line therapy.  I know that Rituxan has been used also.  Lattie Haw, MD  Romans 5:3-5  ADDENDUM: Has blood counts today.  Platelet count is less than 5000.  I think this clearly is no surprise.  He responded very nicely to platelets.  Transfusion should his platelet count up nicely.  Again, I would not expect to see a response to the steroid and IVIG for at least 3 or 4 days.  Lattie Haw, MD

## 2018-11-27 LAB — TYPE AND SCREEN
ABO/RH(D): O POS
Antibody Screen: NEGATIVE
Unit division: 0
Unit division: 0

## 2018-11-27 LAB — COMPREHENSIVE METABOLIC PANEL
ALT: 19 U/L (ref 0–44)
AST: 20 U/L (ref 15–41)
Albumin: 2.9 g/dL — ABNORMAL LOW (ref 3.5–5.0)
Alkaline Phosphatase: 50 U/L (ref 38–126)
Anion gap: 8 (ref 5–15)
BUN: 22 mg/dL — ABNORMAL HIGH (ref 6–20)
CO2: 23 mmol/L (ref 22–32)
Calcium: 8.7 mg/dL — ABNORMAL LOW (ref 8.9–10.3)
Chloride: 103 mmol/L (ref 98–111)
Creatinine, Ser: 0.79 mg/dL (ref 0.61–1.24)
GFR calc Af Amer: >60 mL/min (ref >60)
GFR calc non Af Amer: >60 mL/min (ref >60)
Glucose, Bld: 105 mg/dL — ABNORMAL HIGH (ref 70–99)
Potassium: 4.1 mmol/L (ref 3.5–5.1)
Sodium: 134 mmol/L — ABNORMAL LOW (ref 135–145)
Total Bilirubin: 0.5 mg/dL (ref 0.3–1.2)
Total Protein: 8.6 g/dL — ABNORMAL HIGH (ref 6.5–8.1)

## 2018-11-27 LAB — CBC WITH DIFFERENTIAL/PLATELET
Abs Immature Granulocytes: 0.04 10*3/uL (ref 0.00–0.07)
Basophils Absolute: 0 10*3/uL (ref 0.0–0.1)
Basophils Relative: 0 %
Eosinophils Absolute: 0 10*3/uL (ref 0.0–0.5)
Eosinophils Relative: 0 %
HCT: 24.7 % — ABNORMAL LOW (ref 39.0–52.0)
Hemoglobin: 8.8 g/dL — ABNORMAL LOW (ref 13.0–17.0)
Immature Granulocytes: 1 %
Lymphocytes Relative: 28 %
Lymphs Abs: 1.4 10*3/uL (ref 0.7–4.0)
MCH: 33 pg (ref 26.0–34.0)
MCHC: 35.6 g/dL (ref 30.0–36.0)
MCV: 92.5 fL (ref 80.0–100.0)
Monocytes Absolute: 0.3 10*3/uL (ref 0.1–1.0)
Monocytes Relative: 7 %
Neutro Abs: 3.2 10*3/uL (ref 1.7–7.7)
Neutrophils Relative %: 64 %
Platelets: 32 10*3/uL — ABNORMAL LOW (ref 150–400)
RBC: 2.67 MIL/uL — ABNORMAL LOW (ref 4.22–5.81)
RDW: 15.4 % (ref 11.5–15.5)
WBC: 4.9 10*3/uL (ref 4.0–10.5)
nRBC: 0 % (ref 0.0–0.2)

## 2018-11-27 LAB — PREPARE PLATELET PHERESIS
Unit division: 0
Unit division: 0

## 2018-11-27 LAB — BPAM PLATELET PHERESIS
Blood Product Expiration Date: 202006202359
Blood Product Expiration Date: 202006212359
ISSUE DATE / TIME: 202006191613
ISSUE DATE / TIME: 202006191809
Unit Type and Rh: 6200
Unit Type and Rh: 9500

## 2018-11-27 LAB — BPAM RBC
Blood Product Expiration Date: 202007192359
Blood Product Expiration Date: 202007192359
ISSUE DATE / TIME: 202006192007
ISSUE DATE / TIME: 202006192252
Unit Type and Rh: 5100
Unit Type and Rh: 5100

## 2018-11-27 LAB — LACTATE DEHYDROGENASE: LDH: 146 U/L (ref 98–192)

## 2018-11-27 NOTE — Progress Notes (Signed)
PROGRESS NOTE  Victor Porter YTK:[id number] DOB: [birth year] DOA: 11/19/2018 PCP: Patient, No Pcp Per  Brief History   58 y.o.Spanish-speaking malewithoutknown past medical; who presents with complaints of being progressively more tired over the last month. Spanish interpreter services utilized. [place] His activity has been limited because of becoming easily fatigued. As much as he can remember he is always easily bruised, but never to this extent. He has bruises all over his body including head, tongue/mouth, upper, and lower remedies. Denies having any significant falls or trauma to cause the bruising. Complains of having a hard nodule where each bruises and is painful. Associated symptoms include unwarranted weight loss of approximately 10 kg over 3 months, bleeding gums, and belching after meals. He denies having any significant fever, chest pain, shortness of breath, dysuria, nausea, vomiting, or diarrhea. Patient has no primary care provider and has not received regular healthcare. He last had a few beers a couple months ago. He does not routinely drink alcohol or smoke any tobacco. His father passed away in 22-Aug-2004 at the age of 62, and had similar symptoms. However, he reports that they were not told what caused his death. Of note he does report that he has been using herbalifesupplements includingteas, formula 1,and some there other products.  The patient sister reports that their father died from stomach cancer and had been losing weight and had blood in his stools.  Upon admission into the emergency department patient was found to be afebrile, pulse 57-76, respirations 14-25, and all other vital signs maintained. Labs revealed WBC 3.1, hemoglobin 5.8 with elevated MCV and MCH, platelets <5, BUN 21, creatinine 0.92, LFTs wnl,total bilirubin 1.4, LDH 298, iron 140, TIBC 162, andferritin 624.Patient was ordered to be  transfused 2 units of packed red blood cells. Patient was accepted to a progressive bed as inpatient.  Oncology has been consulted and the patient has undergone a Bone Marrow biopsy on 11/22/2018. This has resulted and is negative for leukemia. 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 Cytogenetics are pending.  On 11/26/2018 the patient received two pheresis packs and tw ounits of PRBC's. Consultants  . Oncology  Procedures  . Bone Marrow Biopsy  Antibiotics   Anti-infectives (From admission, onward)   Start     Dose/Rate Route Frequency Ordered Stop   11/25/18 1000  valACYclovir (VALTREX) tablet 500 mg     500 mg Oral Daily 11/25/18 0711        Subjective  The patient is sitting up in his bed. He is complaining of bleeding from his gums and his nose this morning.   Objective   Vitals:  Vitals:   11/27/18 0404 11/27/18 0851  BP: 115/65 122/80  Pulse: 61 70  Resp: 17 18  Temp: 98.8 F (37.1 C) 98.6 F (37 C)  SpO2: 100% 100%    Exam:  Constitutional:  . The patient is awake, alert, and oriented x 3. No acute distress. Respiratory:  . No wheezes, rales, or rhonchi. . No tactile fremitus. . No increased work of breathing. Cardiovascular:  . Regular rate and rhythm. . No murmurs, ectopy or gallups. . No lateral PMI. No thrills. Abdomen:  . Abdomen is soft, non-tender, non-distended . No hernias, masses, or organomegaly. . Normoactive bowel sounds. Musculoskeletal:  . No cyanosis, clubbing or edema. Skin:  . No rashes, lesions, ulcers . Positive for multiple ecchymoses and hematomas all over the body. . palpation of skin: no induration  or nodules Neurologic:  . CN 2-12 intact . Sensation all 4 extremities intact Psychiatric:  . Mental status o Mood, affect appropriate o Orientation to person, place, time  . judgment and insight appear intact    ENT: Bleeding from gums and nose has resolved.  I have personally reviewed the  following:   Today's Data  . CBC, Vitals.  Scheduled Meds: . feeding supplement (ENSURE ENLIVE)  237 mL Oral BID BM  . multivitamin with minerals  1 tablet Oral Daily  . pantoprazole  40 mg Oral BID  . valACYclovir  500 mg Oral Daily   Continuous Infusions: . sodium chloride Stopped (11/25/18 1155)  . dexamethasone (DECADRON) IVPB ([facility]) 40 mg (11/27/18 1030)    Principal Problem:   Pancytopenia ([place]) Active Problems:   Weight loss   Hypokalemia   Hyperbilirubinemia   LOS: 8 days    A & P   Pancytopenia of unclear etiology: The  patient presented with complaints of generalized weaknesseasy bruising bleeding gums and weight loss. Upon admission his white count was 3.1 hemoglobin 5.8 platelet count less than 5. B12 iron and folate level normal LDL 298 d-dimer 1.63 fibrinogen 317 reticulocyte count normal. Patient so far has received 3 units of packed RBCs.Hb to 8.9 white count is 4.2 and platelet count is 26  this morning.Hepatitis C viral antibody negative HIV negative coronavirus negative. Patient had bone marrow biopsy done 11/22/2018. Results are pending. CT scan abdomenno evidence of cirrhosis normal spleen volume no ascites. RN removed 3 ticks from his body. Ehrlichiosis babesiosis presenting with pancytopenia.  Antibody to ehrlichiosis sent and are negative. I appreciate oncology's assistance. This has resulted and is negative for leukemia. 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 Cytogenetics are pending. Oncology plans to start IVIG and steroids today. On 11/26/2018 the patient received two pheresis packs and tw ounits of PRBC's.  Severe thrombocytopenia with mucosal bleeding: Resolved after transfusion of 2 pheresis packs. Hypokalemia: Resolved. Monitor and supplement as necessary.  Hyperbilirubinemia: Likely due to hemolysis in hematomas. Monitor LFT's.   Unexplained weight loss: Noted. Nutrition consult.  I have seen and examined  this patient myself. I have spent 30 minutes in his evaluation and care.  DVT prophylaxis:SCDs Code Status:Full Family Communicationpatient did not wanted me to call any of his family members.  Disposition Plan:Pending clinical improvement  Clayden Withem, DO [facility] Direct contact: see [email or web address]  7PM-7AM contact night coverage as above  11/27/2018, 1:43 PM  LOS: 4 days

## 2018-11-27 NOTE — Progress Notes (Signed)
Pt RBC transfusion completed with no s/s reaction. Pt tolerated transfusion well; VSS: pt sleeping comfortably in bed with call light within reach. Will continue to closely monitor. Delia Heady RN

## 2018-11-27 NOTE — Progress Notes (Signed)
Patient Victor Porter that he wants to cut his hair despite healthcare workers recommendations not to.  Patient has spoken with physician and unit director about the issue and has been educated about the risk of bleeding and advised not to cut hair at this time.  Patient understands risks and wants to pursue with cutting hair.  Patient feels like he may have more tics hiding in his hair and wants to cut his hair.  Clippers were provided for patient at his request and patient was notified and agreed that he must cut his own hair with no assistance from staff.  RN will check his head for bleeding when he is done and continue to monitor for bleeding over night.

## 2018-11-28 LAB — CBC WITH DIFFERENTIAL/PLATELET
Abs Immature Granulocytes: 0.02 10*3/uL (ref 0.00–0.07)
Basophils Absolute: 0 10*3/uL (ref 0.0–0.1)
Basophils Relative: 0 %
Eosinophils Absolute: 0 10*3/uL (ref 0.0–0.5)
Eosinophils Relative: 0 %
HCT: 27.1 % — ABNORMAL LOW (ref 39.0–52.0)
Hemoglobin: 9.5 g/dL — ABNORMAL LOW (ref 13.0–17.0)
Immature Granulocytes: 0 %
Lymphocytes Relative: 37 %
Lymphs Abs: 1.8 10*3/uL (ref 0.7–4.0)
MCH: 33.2 pg (ref 26.0–34.0)
MCHC: 35.1 g/dL (ref 30.0–36.0)
MCV: 94.8 fL (ref 80.0–100.0)
Monocytes Absolute: 0.3 10*3/uL (ref 0.1–1.0)
Monocytes Relative: 5 %
Neutro Abs: 2.8 10*3/uL (ref 1.7–7.7)
Neutrophils Relative %: 58 %
Platelets: 23 10*3/uL — CL (ref 150–400)
RBC: 2.86 MIL/uL — ABNORMAL LOW (ref 4.22–5.81)
RDW: 15.5 % (ref 11.5–15.5)
WBC: 4.9 10*3/uL (ref 4.0–10.5)
nRBC: 0 % (ref 0.0–0.2)

## 2018-11-28 LAB — COMPREHENSIVE METABOLIC PANEL
ALT: 32 U/L (ref 0–44)
AST: 25 U/L (ref 15–41)
Albumin: 3.1 g/dL — ABNORMAL LOW (ref 3.5–5.0)
Alkaline Phosphatase: 51 U/L (ref 38–126)
Anion gap: 6 (ref 5–15)
BUN: 28 mg/dL — ABNORMAL HIGH (ref 6–20)
CO2: 24 mmol/L (ref 22–32)
Calcium: 8.6 mg/dL — ABNORMAL LOW (ref 8.9–10.3)
Chloride: 103 mmol/L (ref 98–111)
Creatinine, Ser: 0.72 mg/dL (ref 0.61–1.24)
GFR calc Af Amer: >60 mL/min (ref >60)
GFR calc non Af Amer: >60 mL/min (ref >60)
Glucose, Bld: 143 mg/dL — ABNORMAL HIGH (ref 70–99)
Potassium: 3.5 mmol/L (ref 3.5–5.1)
Sodium: 133 mmol/L — ABNORMAL LOW (ref 135–145)
Total Bilirubin: 0.7 mg/dL (ref 0.3–1.2)
Total Protein: 8.3 g/dL — ABNORMAL HIGH (ref 6.5–8.1)

## 2018-11-28 LAB — LACTATE DEHYDROGENASE: LDH: 150 U/L (ref 98–192)

## 2018-11-28 NOTE — Progress Notes (Signed)
PROGRESS NOTE  Victor Porter [id number] DOB: [birth year] DOA: 11/19/2018 PCP: Patient, No Pcp Per  Brief History   58 y.o.Spanish-speaking malewithoutknown past medical; who presents with complaints of being progressively more tired over the last month. Spanish interpreter services [place]. [place] His activity has been limited because of becoming easily fatigued. As much as he can remember he is always easily bruised, but never to this extent. He has bruises all over his body including head, tongue/mouth, upper, and lower remedies. Denies having any significant falls or trauma to cause the bruising. Complains of having a hard nodule where each bruises and is painful. Associated symptoms include unwarranted weight loss of approximately 10 kg over 3 months, bleeding gums, and belching after meals. He denies having any significant fever, chest pain, shortness of breath, dysuria, nausea, vomiting, or diarrhea. Patient has no primary care provider and has not received regular healthcare. He last had a few beers a couple months ago. He does not routinely drink alcohol or smoke any tobacco. His father passed away in Jul 25, 2004 at the age of 100, and had similar symptoms. However, he reports that they were not told what caused his death. Of note he does report that he has been using herbalifesupplements includingteas, formula 1,and some there other products.  The patient sister reports that their father died from stomach cancer and had been losing weight and had blood in his stools.  Upon admission into the emergency department patient was found to be afebrile, pulse 57-76, respirations 14-25, and all other vital signs maintained. Labs revealed WBC 3.1, hemoglobin 5.8 with elevated MCV and MCH, platelets <5, BUN 21, creatinine 0.92, LFTs wnl,total bilirubin 1.4, LDH 298, iron 140, TIBC 162, andferritin 624.Patient was ordered to be  transfused 2 units of packed red blood cells. Patient was accepted to a progressive bed as inpatient.  Oncology has been consulted and the patient has undergone a Bone Marrow biopsy on 11/22/2018. This has resulted and is negative for leukemia. 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 Cytogenetics are pending.  On 11/26/2018 the patient received two pheresis packs and two units of PRBC's. Consultants  . Oncology  Procedures  . Bone Marrow Biopsy  Antibiotics   Anti-infectives (From admission, onward)   Start     Dose/Rate Route Frequency Ordered Stop   11/25/18 1000  valACYclovir (VALTREX) tablet 500 mg     500 mg Oral Daily 11/25/18 0711        Subjective  The patient is sitting up in his bed. No new complaints.   Objective   Vitals:  Vitals:   11/28/18 0700 11/28/18 1100  BP: (!) 136/91 116/70  Pulse: (!) 57 76  Resp: 18 18  Temp: 98.5 F (36.9 C) 98.7 F (37.1 C)  SpO2: 100% 100%    Exam:  Constitutional:  . The patient is awake, alert, and oriented x 3. No acute distress. Respiratory:  . No wheezes, rales, or rhonchi. . No tactile fremitus. . No increased work of breathing. Cardiovascular:  . Regular rate and rhythm. . No murmurs, ectopy or gallups. . No lateral PMI. No thrills. Abdomen:  . Abdomen is soft, non-tender, non-distended . No hernias, masses, or organomegaly. . Normoactive bowel sounds. Musculoskeletal:  . No cyanosis, clubbing or edema. Skin:  . No rashes, lesions, ulcers . Positive for multiple ecchymoses and hematomas all over the body. . palpation of skin: no induration or nodules Neurologic:  . CN 2-12 intact .  Sensation all 4 extremities intact Psychiatric:  . Mental status o Mood, affect appropriate o Orientation to person, place, time  . judgment and insight appear intact    ENT: Bleeding from gums and nose has resolved.  I have personally reviewed the following:   Today's Data  . CBC, BMP,  Vitals.  Scheduled Meds: . feeding supplement (ENSURE ENLIVE)  237 mL Oral BID BM  . multivitamin with minerals  1 tablet Oral Daily  . pantoprazole  40 mg Oral BID  . valACYclovir  500 mg Oral Daily   Continuous Infusions: . sodium chloride Stopped (11/25/18 1155)    Principal Problem:   Pancytopenia ([place]) Active Problems:   Weight loss   Hypokalemia   Hyperbilirubinemia   LOS: 9 days    A & P   Pancytopenia of unclear etiology: The  patient presented with complaints of generalized weaknesseasy bruising bleeding gums and weight loss. Upon admission his white count was 3.1 hemoglobin 5.8 platelet count less than 5. B12 iron and folate level normal LDL 298 d-dimer 1.63 fibrinogen 317 reticulocyte count normal. Patient so far has received 3 units of packed RBCs.Hb to 8.9 white count is 4.2 and platelet count is 26  this morning.Hepatitis C viral antibody negative HIV negative coronavirus negative. Patient had bone marrow biopsy done 11/22/2018. Results are pending. CT scan abdomenno evidence of cirrhosis normal spleen volume no ascites. RN removed 3 ticks from his body. Ehrlichiosis babesiosis presenting with pancytopenia.  Antibody to ehrlichiosis sent and are negative. I appreciate oncology's assistance. This has resulted and is negative for leukemia. 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 Cytogenetics are pending. Oncology plans to start IVIG and steroids today. On 11/26/2018 the patient received two pheresis packs and tw ounits of PRBC's. Hemoglobin and Platelets are stable today. Monitor.  Severe thrombocytopenia with mucosal bleeding: Resolved after transfusion of 2 pheresis packs. Hypokalemia: Resolved. Monitor and supplement as necessary.  Hyperbilirubinemia: Likely due to hemolysis in hematomas. Monitor LFT's.   Unexplained weight loss: Noted. Nutrition consult.  I have seen and examined this patient myself. I have spent 32 minutes in  his evaluation and care.  DVT prophylaxis:SCDs Code Status:Full Family Communicationpatient did not wanted me to call any of his family members.  Disposition Plan:Pending clinical improvement  Edword Cu, DO [facility] Direct contact: see [email or web address]  7PM-7AM contact night coverage as above  11/28/2018, 2:59 PM  LOS: 4 days

## 2018-11-29 LAB — CBC WITH DIFFERENTIAL/PLATELET
Abs Immature Granulocytes: 0.01 10*3/uL (ref 0.00–0.07)
Basophils Absolute: 0 10*3/uL (ref 0.0–0.1)
Basophils Relative: 0 %
Eosinophils Absolute: 0 10*3/uL (ref 0.0–0.5)
Eosinophils Relative: 0 %
HCT: 28.1 % — ABNORMAL LOW (ref 39.0–52.0)
Hemoglobin: 9.8 g/dL — ABNORMAL LOW (ref 13.0–17.0)
Immature Granulocytes: 0 %
Lymphocytes Relative: 38 %
Lymphs Abs: 1.8 10*3/uL (ref 0.7–4.0)
MCH: 32.7 pg (ref 26.0–34.0)
MCHC: 34.9 g/dL (ref 30.0–36.0)
MCV: 93.7 fL (ref 80.0–100.0)
Monocytes Absolute: 0.3 10*3/uL (ref 0.1–1.0)
Monocytes Relative: 6 %
Neutro Abs: 2.6 10*3/uL (ref 1.7–7.7)
Neutrophils Relative %: 56 %
Platelets: 17 10*3/uL — CL (ref 150–400)
RBC: 3 MIL/uL — ABNORMAL LOW (ref 4.22–5.81)
RDW: 14.6 % (ref 11.5–15.5)
WBC: 4.7 10*3/uL (ref 4.0–10.5)
nRBC: 0 % (ref 0.0–0.2)

## 2018-11-29 LAB — COMPREHENSIVE METABOLIC PANEL
ALT: 31 U/L (ref 0–44)
AST: 26 U/L (ref 15–41)
Albumin: 3.2 g/dL — ABNORMAL LOW (ref 3.5–5.0)
Alkaline Phosphatase: 49 U/L (ref 38–126)
Anion gap: 6 (ref 5–15)
BUN: 22 mg/dL — ABNORMAL HIGH (ref 6–20)
CO2: 26 mmol/L (ref 22–32)
Calcium: 8.9 mg/dL (ref 8.9–10.3)
Chloride: 103 mmol/L (ref 98–111)
Creatinine, Ser: 0.71 mg/dL (ref 0.61–1.24)
GFR calc Af Amer: >60 mL/min (ref >60)
GFR calc non Af Amer: >60 mL/min (ref >60)
Glucose, Bld: 111 mg/dL — ABNORMAL HIGH (ref 70–99)
Potassium: 4.1 mmol/L (ref 3.5–5.1)
Sodium: 135 mmol/L (ref 135–145)
Total Bilirubin: 0.9 mg/dL (ref 0.3–1.2)
Total Protein: 8.5 g/dL — ABNORMAL HIGH (ref 6.5–8.1)

## 2018-11-29 LAB — LACTATE DEHYDROGENASE: LDH: 145 U/L (ref 98–192)

## 2018-11-29 MED ORDER — ROMIPLOSTIM 250 MCG ~~LOC~~ SOLR
2.0000 ug/kg | Freq: Once | SUBCUTANEOUS | Status: AC
  Administered 2018-11-29: 155 ug via SUBCUTANEOUS
  Filled 2018-11-29: qty 0.31

## 2018-11-29 NOTE — Progress Notes (Signed)
Overall, there is not been much change with respect to Mr. Victor Porter.  His platelet count is still dropping.  Labs not back yet today.  Yesterday, the platelet count was 23,000.  His hemoglobin and white cell count are holding pretty steady.  He completed the high-dose Decadron and IVIG.  If we see a response, it will be sometime this week.  I will also give a dose of Nplate today.  Maybe this can help stimulate megakaryocytic production.  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  It is possible that this may be a variant of aplastic anemia or may be myelodysplasia.  Labs not back yet from today.  He is afebrile.  All of his vital signs are stable.  Again, I will see if Nplate can help.  I appreciate everybody's help that they are giving Mr. Victor Porter upon 3 Thomasenia Sales, MD  Hebrews 11:1

## 2018-11-29 NOTE — Progress Notes (Signed)
PROGRESS NOTE  Victor Porter SFK:[id number] DOB: 1961/04/29 DOA: 11/19/2018 PCP: Patient, No Pcp Per  Brief History   58 y.o.Spanish-speaking malewithoutknown past medical; who presents with complaints of being progressively more tired over the last month. Spanish interpreter services [place]. [place] His activity has been limited because of becoming easily fatigued. As much as he can remember he is always easily bruised, but never to this extent. He has bruises all over his body including head, tongue/mouth, upper, and lower remedies. Denies having any significant falls or trauma to cause the bruising. Complains of having a hard nodule where each bruises and is painful. Associated symptoms include unwarranted weight loss of approximately 10 kg over 3 months, bleeding gums, and belching after meals. He denies having any significant fever, chest pain, shortness of breath, dysuria, nausea, vomiting, or diarrhea. Patient has no primary care provider and has not received regular healthcare. He last had a few beers a couple months ago. He does not routinely drink alcohol or smoke any tobacco. His father passed away in 2004/08/04 at the age of 63, and had similar symptoms. However, he reports that they were not told what caused his death. Of note he does report that he has been using herbalifesupplements includingteas, formula 1,and some there other products.  The patient sister reports that their father died from stomach cancer and had been losing weight and had blood in his stools.  Upon admission into the emergency department patient was found to be afebrile, pulse 57-76, respirations 14-25, and all other vital signs maintained. Labs revealed WBC 3.1, hemoglobin 5.8 with elevated MCV and MCH, platelets <5, BUN 21, creatinine 0.92, LFTs wnl,total bilirubin 1.4, LDH 298, iron 140, TIBC 162, andferritin 624.Patient was ordered to be  transfused 2 units of packed red blood cells. Patient was accepted to a progressive bed as inpatient.  Oncology has been consulted and the patient has undergone a Bone Marrow biopsy on 11/22/2018. This has resulted and is negative for leukemia. 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 Cytogenetics are pending.  On 11/26/2018 the patient received two pheresis packs and two units of PRBC's. Consultants   Oncology  Procedures   Bone Marrow Biopsy  Antibiotics   Anti-infectives (From admission, onward)   Start     Dose/Rate Route Frequency Ordered Stop   11/25/18 1000  valACYclovir (VALTREX) tablet 500 mg     500 mg Oral Daily 11/25/18 0711        Subjective  Patient seen and examined.  He has no complaints.  In fact he is telling me that he feels strong.  He is anxious to know when he will be able to go home.  Objective   Vitals:  Vitals:   11/29/18 0030 11/29/18 0418  BP: 116/84 121/70  Pulse: (!) 43 70  Resp: 18 16  Temp: 98.8 F (37.1 C) 97.8 F (36.6 C)  SpO2: 97% 100%    Exam: General exam: Appears calm and comfortable  Respiratory system: Clear to auscultation. Respiratory effort normal. Cardiovascular system: S1 & S2 heard, RRR. No JVD, murmurs, rubs, gallops or clicks. No pedal edema. Gastrointestinal system: Abdomen is nondistended, soft and nontender. No organomegaly or masses felt. Normal bowel sounds heard. Central nervous system: Alert and oriented. No focal neurological deficits. Extremities: Symmetric 5 x 5 power. Skin: No rashes, lesions or ulcers , multiple ecchymosis and hematomas Psychiatry: Judgement and insight appear normal. Mood & affect appropriate.  I have personally  reviewed the following:   Today's Data   CBC, BMP, Vitals.  Scheduled Meds:  feeding supplement (ENSURE ENLIVE)  237 mL Oral BID BM   multivitamin with minerals  1 tablet Oral Daily   pantoprazole  40 mg Oral BID   romiPLOStim  2 mcg/kg  Subcutaneous Once   valACYclovir  500 mg Oral Daily   Continuous Infusions:  sodium chloride Stopped (11/25/18 1155)    Principal Problem:   Pancytopenia ([place]) Active Problems:   Weight loss   Hypokalemia   Hyperbilirubinemia   LOS: 10 days    A & P   Pancytopenia of unclear etiology: The  patient presented with complaints of generalized weaknesseasy bruising bleeding gums and weight loss. Upon admission his white count was 3.1 hemoglobin 5.8 platelet count less than 5. B12 iron and folate level normal LDL 298 d-dimer 1.63 fibrinogen 317 reticulocyte count normal. Patient so far has received 3 units of packed RBCs.Hb to 8.9 white count is 4.2 and platelet count is 26  this morning.Hepatitis C viral antibody negative HIV negative coronavirus negative. 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  Oncology is on board and patient has completed courses of IVIG and steroids.  He has been started on Nplate today.  Platelets are dropping and are 17 today.  Severe thrombocytopenia with mucosal bleeding: Resolved after transfusion of 2 pheresis packs but now have started to drop again and platelets are 17 today.  Normocytic anemia: Stable.  Hypokalemia: Resolved.  Hyperbilirubinemia: Resolved.  Likely due to hemolysis.  Monitor LFTs.  Unexplained weight loss: Could very well be due to underlying unknown malignancy.  Nutrition on board.  I have seen and examined this patient myself. I have spent 28 minutes in his evaluation and care.  DVT prophylaxis:SCDs Code Status:Full Family Communication: Discussed with patient.  He did not want any family to be contacted yet. Disposition Plan:Pending clinical improvement  Darliss Cheney, MD [facility] Direct contact: see [email or web address]  7PM-7AM contact night coverage as above

## 2018-11-30 LAB — CBC WITH DIFFERENTIAL/PLATELET
Abs Immature Granulocytes: 0.01 10*3/uL (ref 0.00–0.07)
Basophils Absolute: 0 10*3/uL (ref 0.0–0.1)
Basophils Relative: 0 %
Eosinophils Absolute: 0 10*3/uL (ref 0.0–0.5)
Eosinophils Relative: 0 %
HCT: 31.5 % — ABNORMAL LOW (ref 39.0–52.0)
Hemoglobin: 10.9 g/dL — ABNORMAL LOW (ref 13.0–17.0)
Immature Granulocytes: 0 %
Lymphocytes Relative: 65 %
Lymphs Abs: 2.4 10*3/uL (ref 0.7–4.0)
MCH: 32.6 pg (ref 26.0–34.0)
MCHC: 34.6 g/dL (ref 30.0–36.0)
MCV: 94.3 fL (ref 80.0–100.0)
Monocytes Absolute: 0.2 10*3/uL (ref 0.1–1.0)
Monocytes Relative: 7 %
Neutro Abs: 1 10*3/uL — ABNORMAL LOW (ref 1.7–7.7)
Neutrophils Relative %: 28 %
Platelets: 13 10*3/uL — CL (ref 150–400)
RBC: 3.34 MIL/uL — ABNORMAL LOW (ref 4.22–5.81)
RDW: 14.6 % (ref 11.5–15.5)
WBC: 3.7 10*3/uL — ABNORMAL LOW (ref 4.0–10.5)
nRBC: 0.5 % — ABNORMAL HIGH (ref 0.0–0.2)

## 2018-11-30 LAB — COMPREHENSIVE METABOLIC PANEL WITH GFR
ALT: 28 U/L (ref 0–44)
AST: 21 U/L (ref 15–41)
Albumin: 3.2 g/dL — ABNORMAL LOW (ref 3.5–5.0)
Alkaline Phosphatase: 56 U/L (ref 38–126)
Anion gap: 6 (ref 5–15)
BUN: 25 mg/dL — ABNORMAL HIGH (ref 6–20)
CO2: 27 mmol/L (ref 22–32)
Calcium: 8.8 mg/dL — ABNORMAL LOW (ref 8.9–10.3)
Chloride: 102 mmol/L (ref 98–111)
Creatinine, Ser: 0.87 mg/dL (ref 0.61–1.24)
GFR calc Af Amer: >60 mL/min
GFR calc non Af Amer: >60 mL/min
Glucose, Bld: 116 mg/dL — ABNORMAL HIGH (ref 70–99)
Potassium: 3.9 mmol/L (ref 3.5–5.1)
Sodium: 135 mmol/L (ref 135–145)
Total Bilirubin: 0.7 mg/dL (ref 0.3–1.2)
Total Protein: 8.3 g/dL — ABNORMAL HIGH (ref 6.5–8.1)

## 2018-11-30 NOTE — Progress Notes (Signed)
So far, it is hard to tell if we are seeing any improvement with the Decadron and IVIG that he received.  His CBC is not yet back.  Yesterday, his platelet count was 17,000.  I find it interesting that the white cells and and red cells are holding quite steady.  I did give him a dose of Nplate yesterday.  The CBC today has not yet back.  We ultimately may end up having to do another bone marrow biopsy on him tomorrow.  This will clearly show [place] whether or not there is any megakaryocytic production.  If not, then I think he will need to have aggressive immunosuppressive therapy with ATG.  We just do not do that in [place] any longer.  I probably would see about referring him to [facility].  There is no bleeding.  His exam is pretty much unrevealing.  We will just have to see what the CBC is today.  Lattie Haw, MD  Ecclesiastes 12:14

## 2018-11-30 NOTE — Progress Notes (Signed)
Nutrition Follow-up  RD working remotely.  DOCUMENTATION CODES:   Not applicable  INTERVENTION:  Continue Ensure Enlive po BID, each supplement provides 350 kcal and 20 grams of protein.  Encourage adequate PO intake.   NUTRITION DIAGNOSIS:   Increased nutrient needs related to acute illness as evidenced by estimated needs; ongoing  GOAL:   Patient will meet greater than or equal to 90% of their needs; met  MONITOR:   PO intake, Supplement acceptance, Labs, Weight trends, Skin, I & O's  REASON FOR ASSESSMENT:   Malnutrition Screening Tool    ASSESSMENT:   58 y/o spanish-speaking male w/o known PMhx. Presented to ED w/ 1 month of progressively worsening fatigue and bruising all over his body. Reports loss of 10kg x3 months, bleeding from gums, belching after eating. Has been in [place] visiting since February. In ED, labs/presentation were concerning for underlying malignancy. Pancytopenic. Admitted for med onc consultation.  Bone marrow biopsy 6/15. Negative for leukemia. Awaiting cytogenetics. Pt with amegakaryocytic thrombocytopenia. Decadron and IVIG initiated. Plan for repeat bone marrow biopsy tomorrow to evaluate megakaryocytic production. Meal completion has been 100%. Pt currently has Ensure ordered and has been consuming them. RD to continue with current orders to aid in increased caloric and protein needs.   Labs and medications reviewed.   Diet Order:   Diet Order            Diet Heart Room service appropriate? Yes; Fluid consistency: Thin  Diet effective now              EDUCATION NEEDS:   Not appropriate for education at this time  Skin:  Skin Assessment: Reviewed RN Assessment  Last BM:  6/22  Height:   Ht Readings from Last 1 Encounters:  11/19/18 5' 9"  (1.753 m)    Weight:   Wt Readings from Last 1 Encounters:  11/19/18 77.1 kg    Ideal Body Weight:  72.73 kg  BMI:  Body mass index is 25.1 kg/m.  Estimated Nutritional Needs:    Kcal:  1900-2100 (25-27kcal/kg bw)  Protein:  100-115g Pro (1.3-1.5g/kg bw)  Fluid:  1.9-2.1 L fluid (62m/kcal)    SCorrin Parker MS, RD, LDN Pager # [phone] hours/ weekend pager # [phone]

## 2018-11-30 NOTE — Progress Notes (Signed)
PROGRESS NOTE  Victor Porter HKU:[id number] DOB: 05/30/1961 DOA: 11/19/2018 PCP: Patient, No Pcp Per  Brief History   58 y.o.Spanish-speaking malewithoutknown past medical; who presents with complaints of being progressively more tired over the last month. Spanish interpreter services utilized. [place] His activity has been limited because of becoming easily fatigued. As much as he can remember he is always easily bruised, but never to this extent. He has bruises all over his body including head, tongue/mouth, upper, and lower remedies. Denies having any significant falls or trauma to cause the bruising. Complains of having a hard nodule where each bruises and is painful. Associated symptoms include unwarranted weight loss of approximately 10 kg over 3 months, bleeding gums, and belching after meals. He denies having any significant fever, chest pain, shortness of breath, dysuria, nausea, vomiting, or diarrhea. Patient has no primary care provider and has not received regular healthcare. He last had a few beers a couple months ago. He does not routinely drink alcohol or smoke any tobacco. His father passed away in 07-Aug-2004 at the age of 36, and had similar symptoms. However, he reports that they were not told what caused his death. Of note he does report that he has been using herbalifesupplements includingteas, formula 1,and some there other products.  The patient sister reports that their father died from stomach cancer and had been losing weight and had blood in his stools.  Upon admission into the emergency department patient was found to be afebrile, pulse 57-76, respirations 14-25, and all other vital signs maintained. Labs revealed WBC 3.1, hemoglobin 5.8 with elevated MCV and MCH, platelets <5, BUN 21, creatinine 0.92, LFTs wnl,total bilirubin 1.4, LDH 298, iron 140, TIBC 162, andferritin 624.Patient was ordered to be  transfused 2 units of packed red blood cells. Patient was accepted to a progressive bed as inpatient.  Oncology has been consulted and the patient has undergone a Bone Marrow biopsy on 11/22/2018. This has resulted and is negative for leukemia. 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 Cytogenetics are pending.  On 11/26/2018 the patient received two pheresis packs and two units of PRBC's. Consultants  . Oncology  Procedures  . Bone Marrow Biopsy  Antibiotics   Anti-infectives (From admission, onward)   Start     Dose/Rate Route Frequency Ordered Stop   11/25/18 1000  valACYclovir (VALTREX) tablet 500 mg     500 mg Oral Daily 11/25/18 0711        Subjective  Patient seen and examined.  He complains of very mild nosebleed only with excessive blowing of the nose.  He has no other complaint and he feels strong as ever.  Objective   Vitals:  Vitals:   11/29/18 2315 11/30/18 0700  BP: 123/76 118/85  Pulse: 87 63  Resp: 17 17  Temp: 98.1 F (36.7 C) 98.2 F (36.8 C)  SpO2: 100% 100%    Exam:  General exam: Appears calm and comfortable  Respiratory system: Clear to auscultation. Respiratory effort normal. Cardiovascular system: S1 & S2 heard, RRR. No JVD, murmurs, rubs, gallops or clicks. No pedal edema. Gastrointestinal system: Abdomen is nondistended, soft and nontender. No organomegaly or masses felt. Normal bowel sounds heard. Central nervous system: Alert and oriented. No focal neurological deficits. Extremities: Symmetric 5 x 5 power. Skin: No rashes, lesions or ulcers, has multiple large bruises in both upper and lower extremities. Psychiatry: Judgement and insight appear normal. Mood & affect appropriate.  I have personally reviewed the following:   Today's Data  . CBC, BMP, Vitals.  Scheduled Meds: . feeding supplement (ENSURE ENLIVE)  237 mL Oral BID BM  . multivitamin with minerals  1 tablet Oral Daily  . pantoprazole  40 mg  Oral BID  . valACYclovir  500 mg Oral Daily   Continuous Infusions: . sodium chloride Stopped (11/25/18 1155)    Principal Problem:   Pancytopenia ([place]) Active Problems:   Weight loss   Hypokalemia   Hyperbilirubinemia   LOS: 11 days    A & P   Pancytopenia of unclear etiology: The  patient presented with complaints of generalized weaknesseasy bruising bleeding gums and weight loss. Upon admission his white count was 3.1 hemoglobin 5.8 platelet count less than 5. B12 iron and folate level normal LDL 298 d-dimer 1.63 fibrinogen 317 reticulocyte count normal. Patient so far has received 3 units of packed RBCs.Hb to 8.9 white count is 4.2 and platelet count is 13  this morning.Hepatitis C viral antibody negative HIV negative coronavirus negative. 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  Oncology is on board and patient has completed courses of IVIG and steroids.  He has been started on Nplate yesterday.  Platelets are dropping and are 13 today.  Per oncology note, they are planning on doing repeat bone marrow biopsy on him tomorrow.  Severe thrombocytopenia with mucosal bleeding: Resolved after transfusion of 2 pheresis packs but now have started to drop again and platelets are 13 today and now he has very mild epistaxis only with excessive nose blowing.  Normocytic anemia: Stable.  Hypokalemia: Resolved.  Hyperbilirubinemia: Resolved.  Likely due to hemolysis.  Monitor LFTs.  Unexplained weight loss: Could very well be due to underlying unknown malignancy.  Nutrition on board.  I have seen and examined this patient myself. I have spent 25 minutes in his evaluation and care.  DVT prophylaxis:SCDs Code Status:Full Family Communication: Discussed with patient.  He did not want any family to be contacted yet. Disposition Plan:Pending clinical  improvement  Darliss Cheney, MD [facility] Direct contact: see [email or web address]  7PM-7AM contact night coverage as above

## 2018-12-01 LAB — TYPE AND SCREEN
ABO/RH(D): O POS
Antibody Screen: NEGATIVE

## 2018-12-01 MED ORDER — SODIUM CHLORIDE 0.9% IV SOLUTION
Freq: Once | INTRAVENOUS | Status: AC
  Administered 2018-12-01: 13:00:00 via INTRAVENOUS

## 2018-12-01 MED ORDER — DIPHENHYDRAMINE HCL 25 MG PO CAPS
25.0000 mg | ORAL_CAPSULE | Freq: Once | ORAL | Status: AC
  Administered 2018-12-01: 25 mg via ORAL
  Filled 2018-12-01: qty 1

## 2018-12-01 MED ORDER — ACETAMINOPHEN 325 MG PO TABS
650.0000 mg | ORAL_TABLET | Freq: Once | ORAL | Status: AC
  Administered 2018-12-01: 14:00:00 650 mg via ORAL
  Filled 2018-12-01: qty 2

## 2018-12-01 NOTE — Progress Notes (Signed)
Unfortunately, the platelet count continues to drop slowly but surely.  Yesterday, the platelet count was down to 13,000.  His white cell count was 3.7.  The hemoglobin was 10.9.  I just have a sense that the Decadron and IVIG really did not do much for [place].  I will have to talk to [facility] today.  I suspect that Mr. Victor Porter will need significant immunosuppressive therapy to try to help with this platelet issue.  Cyclosporine and ATG I think would be the next line of therapy.  We just do not have the capability to do ATG anymore in the community.  It is quite toxic and has a lot of complications that go with it.  I just wonder if we may not be able to actually discharge Mr. Victor Porter and then he can go over to [facility] for an evaluation.  I suspect that his platelet count probably will be less than 10,000 today.  There is no bleeding.  He has had no fever.  I probably would check him for collagen vascular diseases.  I know that he does not have any obvious symptoms of these.  However, it might not be a bad idea just to make sure that we are not looking at some type of autoimmune issue which again I think would be highly unlikely.  I do appreciate everybody's help in trying to improve Mr. [name]'s status.  I know this is a complicated situation that we are dealing with.  It would be nice to see what the cytogenetics are on the bone marrow.  I am not sure when they would come back.  Lattie Haw, MD  Psalm 107:1

## 2018-12-01 NOTE — Progress Notes (Signed)
PROGRESS NOTE    Victor Porter  WUJ:[id number] DOB: 1960/10/09 DOA: 11/19/2018 PCP: Patient, No Pcp Per   Brief Narrative: 58 y.o.Spanish-speaking malewithoutknown past medical; who presents with complaints of being progressively more tired over the last month. Spanish interpreter services utilized. [place] His activity has been limited because of becoming easily fatigued. As much as he can remember he is always easily bruised, but never to this extent. He has bruises all over his body including head, tongue/mouth, upper, and lower remedies. Denies having any significant falls or trauma to cause the bruising. Complains of having a hard nodule where each bruises and is painful. Associated symptoms include unwarranted weight loss of approximately 10 kg over 3 months, bleeding gums, and belching after meals. He denies having any significant fever, chest pain, shortness of breath, dysuria, nausea, vomiting, or diarrhea. Patient has no primary care provider and has not received regular healthcare. He last had a few beers a couple months ago. He does not routinely drink alcohol or smoke any tobacco. His father passed away in 2004/07/15 at the age of 26, and had similar symptoms. However, he reports that they were not told what caused his death. Of note he does report that he has been using herbalifesupplements includingteas, formula 1,and some there other products.  The patient sister reports that their father died from stomach cancer and had been losing weight and had blood in his stools.  Upon admission into the emergency department patient was found to be afebrile, pulse 57-76, respirations 14-25, and all other vital signs maintained. Labs revealed WBC 3.1, hemoglobin 5.8 with elevated MCV and MCH, platelets <5, BUN 21, creatinine 0.92, LFTs wnl,total bilirubin 1.4, LDH 298, iron 140, TIBC 162, andferritin 624.Patient was ordered to be  transfused 2 units of packed red blood cells. Patient was accepted to a progressive bed as inpatient.  Oncology has been consulted and the patient has undergone a Bone Marrow biopsy on 11/22/2018. This has resulted and is negative for leukemia. 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 Cytogenetics are pending.  On 11/26/2018 the patient received two pheresis packs and two units of PRBC's  6/24: Remains clinically stable, mild epistaxis  Subjective: Resting, no nausea vomiting chest pain.  No new complaints.  Reports mild epistaxis but has been blowing very hard with force.  Assessment & Plan:  Pancytopenia  : Unclear etiology. On admission wbc 3.1 hb 5.8 platelet count less than 5.  Patient had extensive work-up includingnormal B12/ iron/folate level/ LDL 298 d-dimer 1.63 fibrinogen 317 reticulocyte count normal.  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  Being followed by hematology oncology and appreciate input.  Status post 2 unit of PRBC, 2 pheresis packs.  Also completed IVIG and steroids.  Unfortunately counts continue to downtrend. Recent Labs  Lab 11/27/18 0655 11/28/18 1234 11/29/18 0621 11/30/18 1032 12/01/18 1155  WBC 4.9 4.9 4.7 3.7* 3.1*   Severe thrombocytopenia with mucosal bleeding platelet on admission less than 3 currently after pheresis IVIG steroids at 13 and downtrending.  Noted oncology plan for further immunosuppressive therapy.  Has mild epistaxis and I have discussed with him not to blow his nose as he has been doing that forcefully which could exacerbate his epistaxis. Plt pending-him back at 9000 and 1 unit platelet has been ordered per oncology. Recent Labs  Lab 11/27/18 0655 11/28/18 1234 11/29/18 0621 11/30/18 1032 12/01/18 1155  PLT 32* 23* 17* 13* 9*    Normocytic anemia: With pancytopenia.  Monitor hemoglobin.   Recent Labs  Lab 11/27/18  0655 11/28/18 1234 11/29/18 0621 11/30/18 1032 12/01/18 1155  HGB 8.8* 9.5* 9.8* 10.9* 11.6*  HCT 24.7* 27.1* 28.1* 31.5* 32.8*   Hypokalemia: Resolved.   Hyperbilirubinemia: Likely due to hemolysis.  Improved.   Unexplained weight loss: Could very well be due to underlying unknown malignancy.  Nutrition on board.  DVT prophylaxis: cont SCD. Heparin/lovenox-contraindicated Code Status: Full code  family Communication: Updated family over the phone.  patient's family as interpreter as per patient's request. Disposition Plan: remains inpatient pending clinical improvement.   Consultants:  Oncology  Procedures: Bone marrow biopsy  Antimicrobials: Anti-infectives (From admission, onward)   Start     Dose/Rate Route Frequency Ordered Stop   11/25/18 1000  valACYclovir (VALTREX) tablet 500 mg     500 mg Oral Daily 11/25/18 0711         Objective: Vitals:   12/01/18 0733 12/01/18 1132 12/01/18 1425 12/01/18 1440  BP: 139/85 (!) 148/92 129/86 113/85  Pulse: 72 74 74 79  Resp: _0 Temp: 98.2 F (36.8 C) 97.9 F (36.6 C) 98.6 F (37 C) 98.5 F (36.9 C)  TempSrc: Oral Oral Oral Oral  SpO2: 100% 100% 100% 100%  Weight:      Height:        Intake/Output Summary (Last 24 hours) at 12/01/2018 1449 Last data filed at 11/30/2018 1700 Gross per 24 hour  Intake 300 ml  Output -  Net 300 ml   Filed Weights   11/19/18 0411 11/30/18 2000  Weight: 77.1 kg 77.1 kg   Weight change:   Body mass index is 25.1 kg/m.  Intake/Output from previous day: 06/23 0701 - 06/24 0700 In: 850 [P.O.:850] Out: -  Intake/Output this shift: No intake/output data recorded.  Examination:  General exam: Appears calm and comfortable,Not in distress, older fore the age HEENT:PERRL,Oral mucosa moist, Ear/Nose normal on gross exam Respiratory system: Bilateral equal air entry, normal vesicular breath sounds, no wheezes or crackles  Cardiovascular system: S1 & S2 heard,No JVD,  murmurs. Gastrointestinal system: Abdomen is  soft, non tender, non distended, BS +  Nervous System:Alert and oriented. No focal neurological deficits/moving extremities, sensation intact. Extremities: No edema, no clubbing, distal peripheral pulses palpable. Skin: No rashes, lesions, no icterus MSK: Normal muscle bulk,tone ,power  Medications:  Scheduled Meds: . sodium chloride   Intravenous Once  . feeding supplement (ENSURE ENLIVE)  237 mL Oral BID BM  . multivitamin with minerals  1 tablet Oral Daily  . pantoprazole  40 mg Oral BID  . valACYclovir  500 mg Oral Daily   Continuous Infusions: . sodium chloride Stopped (11/25/18 1155)    Data Reviewed: I have personally reviewed following labs and imaging studies  CBC: Recent Labs  Lab 11/27/18 0655 11/28/18 1234 11/29/18 0621 11/30/18 1032 12/01/18 1155  WBC 4.9 4.9 4.7 3.7* 3.1*  NEUTROABS 3.2 2.8 2.6 1.0* 1.0*  HGB 8.8* 9.5* 9.8* 10.9* 11.6*  HCT 24.7* 27.1* 28.1* 31.5* 32.8*  MCV 92.5 94.8 93.7 94.3 93.7  PLT 32* 23* 17* 13* 9*   Basic Metabolic Panel: Recent Labs  Lab 11/26/18 0835 11/27/18 0655 11/28/18 1234 11/29/18 0621 11/30/18 1032  NA 134* 134* 133* 135 135  K 4.0 4.1 3.5 4.1 3.9  CL 102 103 103 103 102  CO2 _1 GLUCOSE 132* 105* 143* 111*  116*  BUN 21* 22* 28* 22* 25*  CREATININE 0.79 0.79 0.72 0.71 0.87  CALCIUM 8.9 8.7* 8.6* 8.9 8.8*   GFR: Estimated Creatinine Clearance: 94.8 mL/min (by C-G formula based on SCr of 0.87 mg/dL). Liver Function Tests: Recent Labs  Lab 11/26/18 0835 11/27/18 0655 11/28/18 1234 11/29/18 0621 11/30/18 1032  AST _0 ALT 21 19 32 31 28  ALKPHOS 58 50 51 49 56  BILITOT 0.7 0.5 0.7 0.9 0.7  PROT 8.8* 8.6* 8.3* 8.5* 8.3*  ALBUMIN 3.5 2.9* 3.1* 3.2* 3.2*   No results for input(s): LIPASE, AMYLASE in the last 168 hours. No results for input(s): AMMONIA in the last 168 hours. Coagulation Profile: No results for input(s): INR, PROTIME  in the last 168 hours. Cardiac Enzymes: No results for input(s): CKTOTAL, CKMB, CKMBINDEX, TROPONINI in the last 168 hours. BNP (last 3 results) No results for input(s): PROBNP in the last 8760 hours. HbA1C: No results for input(s): HGBA1C in the last 72 hours. CBG: No results for input(s): GLUCAP in the last 168 hours. Lipid Profile: No results for input(s): CHOL, HDL, LDLCALC, TRIG, CHOLHDL, LDLDIRECT in the last 72 hours. Thyroid Function Tests: No results for input(s): TSH, T4TOTAL, FREET4, T3FREE, THYROIDAB in the last 72 hours. Anemia Panel: No results for input(s): VITAMINB12, FOLATE, FERRITIN, TIBC, IRON, RETICCTPCT in the last 72 hours. Sepsis Labs: No results for input(s): PROCALCITON, LATICACIDVEN in the last 168 hours.  No results found for this or any previous visit (from the past 240 hour(s)).    Radiology Studies: No results found.    LOS: 12 days   Time spent: More than 50% of that time was spent in counseling and/or coordination of care.  Antonieta Pert, MD [facility]  12/01/2018, 2:49 PM

## 2018-12-01 NOTE — Plan of Care (Signed)
Pt is doing well. W/O complaints for shift. He continues to progress well, but his platelet levels continue to decline. M.Amiria Orrison,RN

## 2018-12-02 ENCOUNTER — Inpatient Hospital Stay: Payer: Self-pay | Admitting: Primary Care

## 2018-12-02 LAB — CBC WITH DIFFERENTIAL/PLATELET
Abs Immature Granulocytes: 0 10*3/uL (ref 0.00–0.07)
Abs Immature Granulocytes: 0 10*3/uL (ref 0.00–0.07)
Basophils Absolute: 0 10*3/uL (ref 0.0–0.1)
Basophils Absolute: 0 10*3/uL (ref 0.0–0.1)
Basophils Relative: 0 %
Basophils Relative: 0 %
Eosinophils Absolute: 0 10*3/uL (ref 0.0–0.5)
Eosinophils Absolute: 0 10*3/uL (ref 0.0–0.5)
Eosinophils Relative: 1 %
Eosinophils Relative: 1 %
HCT: 32.8 % — ABNORMAL LOW (ref 39.0–52.0)
HCT: 28.6 % — ABNORMAL LOW (ref 39.0–52.0)
Hemoglobin: 11.6 g/dL — ABNORMAL LOW (ref 13.0–17.0)
Hemoglobin: 10.1 g/dL — ABNORMAL LOW (ref 13.0–17.0)
Immature Granulocytes: 0 %
Immature Granulocytes: 0 %
Lymphocytes Relative: 60 %
Lymphocytes Relative: 63 %
Lymphs Abs: 1.8 10*3/uL (ref 0.7–4.0)
Lymphs Abs: 1.7 10*3/uL (ref 0.7–4.0)
MCH: 33.1 pg (ref 26.0–34.0)
MCH: 33 pg (ref 26.0–34.0)
MCHC: 35.4 g/dL (ref 30.0–36.0)
MCHC: 35.3 g/dL (ref 30.0–36.0)
MCV: 93.7 fL (ref 80.0–100.0)
MCV: 93.5 fL (ref 80.0–100.0)
Monocytes Absolute: 0.2 10*3/uL (ref 0.1–1.0)
Monocytes Absolute: 0.2 10*3/uL (ref 0.1–1.0)
Monocytes Relative: 6 %
Monocytes Relative: 7 %
Neutro Abs: 1 10*3/uL — ABNORMAL LOW (ref 1.7–7.7)
Neutro Abs: 0.8 10*3/uL — ABNORMAL LOW (ref 1.7–7.7)
Neutrophils Relative %: 33 %
Neutrophils Relative %: 29 %
Platelets: 9 10*3/uL — CL (ref 150–400)
Platelets: 19 10*3/uL — CL (ref 150–400)
RBC: 3.5 MIL/uL — ABNORMAL LOW (ref 4.22–5.81)
RBC: 3.06 MIL/uL — ABNORMAL LOW (ref 4.22–5.81)
RDW: 14.4 % (ref 11.5–15.5)
RDW: 14.2 % (ref 11.5–15.5)
WBC: 3.1 10*3/uL — ABNORMAL LOW (ref 4.0–10.5)
WBC: 2.7 10*3/uL — ABNORMAL LOW (ref 4.0–10.5)
nRBC: 0 % (ref 0.0–0.2)
nRBC: 0 % (ref 0.0–0.2)

## 2018-12-02 LAB — BPAM PLATELET PHERESIS
Blood Product Expiration Date: 202006252359
ISSUE DATE / TIME: 202006241353
Unit Type and Rh: 5100

## 2018-12-02 LAB — PREPARE PLATELET PHERESIS: Unit division: 0

## 2018-12-02 NOTE — Progress Notes (Signed)
Unfortunately, Mr. Madison Hickman counts are still dropping.  He did receive platelets yesterday.  His platelet count was 9000.  He got a unit of platelets.  Today the platelet count is only 19,000.  His white cell count is also dropping.  It is now 2.7.  I have yet to hear from [facility] about having [name] seen there.  We are really in need of the chromosome studies.  They should be back by now.  It would be interesting to see if there are any abnormal cytogenetics with his bone marrow studies.  I thought about the possibility of doing another bone marrow test on him to see if there is any change that have been when he got the Decadron and IVIG.  I am not sure this will change management.  I think that the chromosome studies could help [place] with respect to options for therapy.  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  He certainly would be quite young for this.  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  The best treatment for this is significant immunosuppressive therapy with ATG and cyclosporine.  That type of protocol is just to intensive to be done in [place].  Hopefully, I will hear from [facility] today.  I suspect that what we might do is have him discharged and then he can follow-up in the clinic at [facility] for them to then decide about how to proceed with therapy.  They may actually do their own bone marrow biopsy on him and send the specimen off for special analysis.  I must say this is a very difficult case that we really do not see all that often.  I suspect that he is going to need intensive immunosuppressive therapy to try to help.  Lattie Haw, MD  Jeneen Rinks 4:10

## 2018-12-02 NOTE — Progress Notes (Signed)
PROGRESS NOTE    Victor Porter  HAL:[id number] DOB: 1960-12-19 DOA: 11/19/2018 PCP: Patient, No Pcp Per   Brief Narrative: 58 y.o.Spanish-speaking malewithoutknown past medical; who presents with complaints of being progressively more tired over the last month. Spanish interpreter services utilized. [place] His activity has been limited because of becoming easily fatigued. As much as he can remember he is always easily bruised, but never to this extent. He has bruises all over his body including head, tongue/mouth, upper, and lower remedies. Denies having any significant falls or trauma to cause the bruising. Complains of having a hard nodule where each bruises and is painful. Associated symptoms include unwarranted weight loss of approximately 10 kg over 3 months, bleeding gums, and belching after meals. He denies having any significant fever, chest pain, shortness of breath, dysuria, nausea, vomiting, or diarrhea. Patient has no primary care provider and has not received regular healthcare. He last had a few beers a couple months ago. He does not routinely drink alcohol or smoke any tobacco. His father passed away in Aug 15, 2004 at the age of 28, and had similar symptoms. However, he reports that they were not told what caused his death. Of note he does report that he has been using herbalifesupplements includingteas, formula 1,and some there other products.  The patient sister reports that their father died from stomach cancer and had been losing weight and had blood in his stools.  Upon admission into the emergency department patient was found to be afebrile, pulse 57-76, respirations 14-25, and all other vital signs maintained. Labs revealed WBC 3.1, hemoglobin 5.8 with elevated MCV and MCH, platelets <5, BUN 21, creatinine 0.92, LFTs wnl,total bilirubin 1.4, LDH 298, iron 140, TIBC 162, andferritin 624.Patient was ordered to be  transfused 2 units of packed red blood cells. Patient was accepted to a progressive bed as inpatient.  Oncology has been consulted and the patient has undergone a Bone Marrow biopsy on 11/22/2018. This has resulted and is negative for leukemia. 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 Cytogenetics are pending.  On 11/26/2018 the patient received two pheresis packs and two units of PRBC's  6/24: Remains clinically stable, mild epistaxis, plt at 9k- received 1 unit platelets  Subjective: On bedside chair no new complaints.  Reports he is not blowing his nose anymore and he has no recurrence of epistaxis.  No bruises.   He requested that his sister help with the translation and was able to get through the phone.  Assessment & Plan:  Pancytopenia  : Unclear etiology. On admission wbc 3.1 hb 5.8 platelet count less than 5.  Patient had extensive work-up includingnormal B12/ iron/folate level/ LDL 298 d-dimer 1.63 fibrinogen 317 reticulocyte count normal.  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  Being followed by hematology oncology and appreciate input: Hoping to get chromosomal studies soon and Dr Marin Olp is waiting to hear back from [facility] hematology to discuss the case, as patient may need significant immunosuppressive therapy with ATG and cyclosporine which is very intensive protocol and cannot be done in [place].Status post 2 unit of PRBC, 3 pheresis packs and s/p IVIG and steroids.  Unfortunately counts continue to downtrend. Recent Labs  Lab 11/28/18 1234 11/29/18 0621 11/30/18 1032 12/01/18 1155 12/02/18 0405  WBC 4.9 4.7 3.7* 3.1* 2.7*   Severe thrombocytopenia with mucosal bleeding platelet on admission less than 3 currently after pheresis IVIG  steroids at 13 and downtrending.  Noted oncology plan for further immunosuppressive therapy.  Has mild epistaxis but  resolved after I requested him not to blow the nose.  Platelet post transfusion 19 K from [place]. Recent Labs  Lab 11/28/18 1234 11/29/18 0621 11/30/18 1032 12/01/18 1155 12/02/18 0405  PLT 23* 17* 13* 9* 19*   Normocytic anemia: With pancytopenia.  Hb stable post transfusion.   Recent Labs  Lab 11/28/18 1234 11/29/18 0621 11/30/18 1032 12/01/18 1155 12/02/18 0405  HGB 9.5* 9.8* 10.9* 11.6* 10.1*  HCT 27.1* 28.1* 31.5* 32.8* 28.6*   Hypokalemia: Resolved.   Hyperbilirubinemia: Likely due to hemolysis.  Improved.   Unexplained weight loss: Could very well be due to underlying unknown malignancy? reelated to #1, Nutrition on board.  DVT prophylaxis: cont SCD.  Heparin/lovenox-contraindicated Code Status: Full code  family Communication: Updated family over the phone.  patient's family as interpreter as per patient's request. Disposition Plan: remains inpatient pending clinical improvement.  Patient hematology input.   Consultants:  Oncology  Procedures: Bone marrow biopsy  Antimicrobials: Anti-infectives (From admission, onward)   Start     Dose/Rate Route Frequency Ordered Stop   11/25/18 1000  valACYclovir (VALTREX) tablet 500 mg     500 mg Oral Daily 11/25/18 0711         Objective: Vitals:   12/01/18 1958 12/01/18 2310 12/02/18 0353 12/02/18 0700  BP: 127/84 109/70 (!) 148/84 (!) 143/93  Pulse: 83 63 90 89  Resp: 17 17 18 18   Temp: 98.1 F (36.7 C) 98.6 F (37 C) 97.8 F (36.6 C) 98 F (36.7 C)  TempSrc: Oral Oral Oral Oral  SpO2: 100% 100% 100% 100%  Weight:      Height:        Intake/Output Summary (Last 24 hours) at 12/02/2018 1121 Last data filed at 12/02/2018 0900 Gross per 24 hour  Intake 950 ml  Output --  Net 950 ml   Filed Weights   11/19/18 0411 11/30/18 2000  Weight: 77.1 kg 77.1 kg   Weight change:   Body mass index is 25.1 kg/m.  Intake/Output from previous day: 06/24 0701 - 06/25 0700 In: 600 [P.O.:600] Out: -   Intake/Output this shift: Total I/O In: 350 [P.O.:350] Out: -   Examination:  General exam: Alert awake oriented, comfortable, not in acute distress. HEENT:PERRL,Oral mucosa moist, Ear/Nose normal on gross exam. Respiratory system: Bilateral clear no wheezing or crackles.  Cardiovascular system: S1 & S2 heard,No JVD,murmurs. Gastrointestinal system: Abdomen is  soft,non tender, non distended, BS +.  Nervous System:Alert and oriented.No focal neurological deficits/moving extremities, sensation intact. Extremities: No edema, no clubbing,distal peripheral pulses palpable. Skin: No rashes, lesions, no icterus. MSK: Normal muscle bulk,tone,power.  Medications: Scheduled Meds:  feeding supplement (ENSURE ENLIVE)  237 mL Oral BID BM   multivitamin with minerals  1 tablet Oral Daily   pantoprazole  40 mg Oral BID   valACYclovir  500 mg Oral Daily   Continuous Infusions:  sodium chloride Stopped (11/25/18 1155)    Data Reviewed: I have personally reviewed following labs and imaging studies  CBC: Recent Labs  Lab 11/28/18 1234 11/29/18 0621 11/30/18 1032 12/01/18 1155 12/02/18 0405  WBC 4.9 4.7 3.7* 3.1* 2.7*  NEUTROABS 2.8 2.6 1.0* 1.0* 0.8*  HGB 9.5* 9.8* 10.9* 11.6* 10.1*  HCT 27.1* 28.1* 31.5* 32.8* 28.6*  MCV 94.8 93.7 94.3 93.7 93.5  PLT 23* 17* 13* 9* 19*   Basic Metabolic Panel: Recent Labs  Lab 11/26/18 0835  11/27/18 0655 11/28/18 1234 11/29/18 0621 11/30/18 1032  NA 134* 134* 133* 135 135  K 4.0 4.1 3.5 4.1 3.9  CL 102 103 103 103 102  CO2 24 23 24 26 27   GLUCOSE 132* 105* 143* 111* 116*  BUN 21* 22* 28* 22* 25*  CREATININE 0.79 0.79 0.72 0.71 0.87  CALCIUM 8.9 8.7* 8.6* 8.9 8.8*   GFR: Estimated Creatinine Clearance: 94.8 mL/min (by C-G formula based on SCr of 0.87 mg/dL). Liver Function Tests: Recent Labs  Lab 11/26/18 0835 11/27/18 0655 11/28/18 1234 11/29/18 0621 11/30/18 1032  AST 22 20 25 26 21   ALT 21 19 32 31 28  ALKPHOS 58 50  51 49 56  BILITOT 0.7 0.5 0.7 0.9 0.7  PROT 8.8* 8.6* 8.3* 8.5* 8.3*  ALBUMIN 3.5 2.9* 3.1* 3.2* 3.2*   No results for input(s): LIPASE, AMYLASE in the last 168 hours. No results for input(s): AMMONIA in the last 168 hours. Coagulation Profile: No results for input(s): INR, PROTIME in the last 168 hours. Cardiac Enzymes: No results for input(s): CKTOTAL, CKMB, CKMBINDEX, TROPONINI in the last 168 hours. BNP (last 3 results) No results for input(s): PROBNP in the last 8760 hours. HbA1C: No results for input(s): HGBA1C in the last 72 hours. CBG: No results for input(s): GLUCAP in the last 168 hours. Lipid Profile: No results for input(s): CHOL, HDL, LDLCALC, TRIG, CHOLHDL, LDLDIRECT in the last 72 hours. Thyroid Function Tests: No results for input(s): TSH, T4TOTAL, FREET4, T3FREE, THYROIDAB in the last 72 hours. Anemia Panel: No results for input(s): VITAMINB12, FOLATE, FERRITIN, TIBC, IRON, RETICCTPCT in the last 72 hours. Sepsis Labs: No results for input(s): PROCALCITON, LATICACIDVEN in the last 168 hours.  No results found for this or any previous visit (from the past 240 hour(s)).    Radiology Studies: No results found.    LOS: 13 days   Time spent: More than 50% of that time was spent in counseling and/or coordination of care.  Antonieta Pert, MD [facility]  12/02/2018, 11:21 AM

## 2018-12-03 LAB — CBC WITH DIFFERENTIAL/PLATELET
Abs Immature Granulocytes: 0 10*3/uL (ref 0.00–0.07)
Basophils Absolute: 0 10*3/uL (ref 0.0–0.1)
Basophils Relative: 0 %
Eosinophils Absolute: 0 10*3/uL (ref 0.0–0.5)
Eosinophils Relative: 1 %
HCT: 29.6 % — ABNORMAL LOW (ref 39.0–52.0)
Hemoglobin: 10.4 g/dL — ABNORMAL LOW (ref 13.0–17.0)
Immature Granulocytes: 0 %
Lymphocytes Relative: 59 %
Lymphs Abs: 1.3 10*3/uL (ref 0.7–4.0)
MCH: 32.7 pg (ref 26.0–34.0)
MCHC: 35.1 g/dL (ref 30.0–36.0)
MCV: 93.1 fL (ref 80.0–100.0)
Monocytes Absolute: 0.2 10*3/uL (ref 0.1–1.0)
Monocytes Relative: 8 %
Neutro Abs: 0.7 10*3/uL — ABNORMAL LOW (ref 1.7–7.7)
Neutrophils Relative %: 32 %
Platelets: 5 10*3/uL — CL (ref 150–400)
RBC: 3.18 MIL/uL — ABNORMAL LOW (ref 4.22–5.81)
RDW: 14 % (ref 11.5–15.5)
WBC: 2.2 10*3/uL — ABNORMAL LOW (ref 4.0–10.5)
nRBC: 0 % (ref 0.0–0.2)

## 2018-12-03 MED ORDER — ACETAMINOPHEN 325 MG PO TABS
650.0000 mg | ORAL_TABLET | Freq: Once | ORAL | Status: AC
  Administered 2018-12-03: 650 mg via ORAL
  Filled 2018-12-03: qty 2

## 2018-12-03 MED ORDER — SODIUM CHLORIDE 0.9% IV SOLUTION: Freq: Once | INTRAVENOUS | Status: AC

## 2018-12-03 MED ORDER — DIPHENHYDRAMINE HCL 25 MG PO CAPS
25.0000 mg | ORAL_CAPSULE | Freq: Once | ORAL | Status: AC
  Administered 2018-12-03: 25 mg via ORAL
  Filled 2018-12-03: qty 1

## 2018-12-03 MED ORDER — TBO-FILGRASTIM 480 MCG/0.8ML ~~LOC~~ SOSY
480.0000 ug | PREFILLED_SYRINGE | Freq: Once | SUBCUTANEOUS | Status: AC
  Administered 2018-12-03: 480 ug via SUBCUTANEOUS
  Filled 2018-12-03: qty 0.8

## 2018-12-03 NOTE — Progress Notes (Signed)
I finally got hold of Dr. Jerrye Noble at [facility].  He is malignant hematology chief.  He knows it all where he counts to hematology.  I talked to him about Mr. Victor Porter.  I gave him the synopsis of his hospital stay and his lab work and bone marrow biopsy results.  He would like to see Mr. Victor Porter in the clinic.  I think this would be reasonable.  It will be next week.  As far as his counts go today, his white cell count is 2.2.  Hemoglobin 10.4.  Platelet count 5000.  I am still awaiting the cytogenetic report.  This I think will clearly help.  I think that he will need to be transfused.  I will give him 1 unit of platelets.  I am surprised that his hemoglobin is holding on so well.  As far as his white cell count is concerned, I will try him on a dose of Neupogen.  We will see if this helps.  When I talked to Dr. Florene Glen, he thought that he would do another bone marrow test on Mr. Victor Porter when he sees him.  This way, they can do special studies.  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  Mr. Victor Porter was so I be young for this.  I would not think this is something autoimmune.  However, we cannot totally discount this.  Again, I would transfuse Mr. Victor Porter with platelets.  I would then plan on letting him go home.  [facility] will contact him.  They will let him know when to go to their clinic.  Hopefully, a language barrier will not be an issue.  I think we have done all that we can do for Mr. Victor Porter as an inpatient.  Another option for him would be Rituxan.  I think this would have a low likelihood of helping.  Again I suspect that he will need aggressive immunosuppressive therapy.  I very much appreciate all the great care that Mr. Victor Porter received by everybody on 3 W.  Lattie Haw, MD  Fara Olden 2:32

## 2018-12-03 NOTE — Plan of Care (Signed)
  Problem: Health Behavior/Discharge Planning: Goal: Ability to manage health-related needs will improve Outcome: Progressing   

## 2018-12-03 NOTE — Progress Notes (Signed)
PROGRESS NOTE    Victor Porter  CBU:[id number] DOB: June 27, 1960 DOA: 11/19/2018 PCP: Patient, No Pcp Per    Brief Narrative:  58 year old Spanish-speaking male who presents with generalized fatigue.  Reported worsening symptoms for last 4 weeks, positive weight loss, unintentional 10 kg over last 3 months, bleeding gums and multiple cutaneous ecchymosis.  On his initial physical examination he was hemodynamically stable with blood pressure 118/77, heart rate 60, respiratory rate 22, temperature 98, oxygen saturation 100%.  He had bleeding gums, his lungs were clear to auscultation, heart S1- S2 present rhythmic, abdomen was soft nontender, no lower extremity edema.  Skin had multiple ecchymosis left forehead along with his upper and lower extremities.  White cell count 3.1, hemoglobin 5.8, hematocrit 17.2, platelets less than 5.   He was admitted to the hospital for working diagnosis of severe pancytopenia.   Assessment & Plan:   Principal Problem:   Pancytopenia ([place]) Active Problems:   Weight loss   Hypokalemia   Hyperbilirubinemia   1. Pancytopenia, with severe thrombocytopenia.  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  Apparently patient was exposed to tick bites recently. WBC 2.2, Hgb 10,4, Hct 29.6, Pltatelets 5. Patient will get platelet transfusion and filgrastim today. Will wait platelets to be at least above 10, before discharge. He will follow with Dr. Jerrye Noble from [facility] as outpatient.   2. Hypokalemia. Clinically resolved, renal function has been stable with serum cr at 0.87.   3. Hyperbilirubinemia. Clinically resolved.   4. Weight loss. Will need outpatient follow up.    DVT prophylaxis: scd   Code Status: full Family Communication: no family at the bedside  Disposition Plan/ discharge barriers: pending plt improvement.   Body mass index is 25.1 kg/m.  Malnutrition Type:  Nutrition Problem: Increased nutrient needs Etiology: acute illness   Malnutrition Characteristics:  Signs/Symptoms: estimated needs   Nutrition Interventions:  Interventions: Ensure Enlive (each supplement provides 350kcal and 20 grams of protein)  RN Pressure Injury Documentation:     Consultants:   Hematology   Procedures:   Bone marrow biopsy 11/22/18  Antimicrobials:       Subjective: Patient continue to have ecchymosis in his lower extremities, no further epistaxis or gum bleeding, no nausea or vomiting. Patient was exposed to ticks.   Objective: Vitals:   12/03/18 0314 12/03/18 0825 12/03/18 1106 12/03/18 1127  BP: 131/85 136/85 137/76 130/72  Pulse: 89 80 78 73  Resp: 18 16 16 16   Temp: 99 F (37.2 C) 98.9 F (37.2 C) 99.1 F (37.3 C) 99.1 F (37.3 C)  TempSrc: Oral Oral Oral Oral  SpO2: 100% 100% 100% 100%  Weight:      Height:        Intake/Output Summary (Last 24 hours) at 12/03/2018 1301 Last data filed at 12/03/2018 0557 Gross per 24 hour  Intake 600 ml  Output 1250 ml  Net -650 ml   Filed Weights   11/19/18 0411 11/30/18 2000  Weight: 77.1 kg 77.1 kg    Examination:   General: deconditioned  Neurology: Awake and alert, non focal  E ENT: mild pallor, no icterus, oral mucosa moist Cardiovascular: No JVD. S1-S2 present, rhythmic, no gallops, rubs, or murmurs. No lower extremity edema. Pulmonary: positive breath sounds bilaterally, adequate air movement, no wheezing, rhonchi or rales. Gastrointestinal. Abdomen with no organomegaly, non tender, no rebound or guarding Skin. Multiple skin ecchymosis, lower extremities.  Musculoskeletal: no joint deformities  Data Reviewed: I have personally reviewed following labs and imaging studies  CBC: Recent Labs  Lab 11/29/18 0621 11/30/18 1032 12/01/18 1155 12/02/18 0405 12/03/18 0345  WBC 4.7 3.7* 3.1* 2.7* 2.2*  NEUTROABS 2.6 1.0* 1.0* 0.8* 0.7*  HGB 9.8*  10.9* 11.6* 10.1* 10.4*  HCT 28.1* 31.5* 32.8* 28.6* 29.6*  MCV 93.7 94.3 93.7 93.5 93.1  PLT 17* 13* 9* 19* 5*   Basic Metabolic Panel: Recent Labs  Lab 11/27/18 0655 11/28/18 1234 11/29/18 0621 11/30/18 1032  NA 134* 133* 135 135  K 4.1 3.5 4.1 3.9  CL 103 103 103 102  CO2 23 24 26 27   GLUCOSE 105* 143* 111* 116*  BUN 22* 28* 22* 25*  CREATININE 0.79 0.72 0.71 0.87  CALCIUM 8.7* 8.6* 8.9 8.8*   GFR: Estimated Creatinine Clearance: 94.8 mL/min (by C-G formula based on SCr of 0.87 mg/dL). Liver Function Tests: Recent Labs  Lab 11/27/18 0655 11/28/18 1234 11/29/18 0621 11/30/18 1032  AST 20 25 26 21   ALT 19 32 31 28  ALKPHOS 50 51 49 56  BILITOT 0.5 0.7 0.9 0.7  PROT 8.6* 8.3* 8.5* 8.3*  ALBUMIN 2.9* 3.1* 3.2* 3.2*   No results for input(s): LIPASE, AMYLASE in the last 168 hours. No results for input(s): AMMONIA in the last 168 hours. Coagulation Profile: No results for input(s): INR, PROTIME in the last 168 hours. Cardiac Enzymes: No results for input(s): CKTOTAL, CKMB, CKMBINDEX, TROPONINI in the last 168 hours. BNP (last 3 results) No results for input(s): PROBNP in the last 8760 hours. HbA1C: No results for input(s): HGBA1C in the last 72 hours. CBG: No results for input(s): GLUCAP in the last 168 hours. Lipid Profile: No results for input(s): CHOL, HDL, LDLCALC, TRIG, CHOLHDL, LDLDIRECT in the last 72 hours. Thyroid Function Tests: No results for input(s): TSH, T4TOTAL, FREET4, T3FREE, THYROIDAB in the last 72 hours. Anemia Panel: No results for input(s): VITAMINB12, FOLATE, FERRITIN, TIBC, IRON, RETICCTPCT in the last 72 hours.    Radiology Studies: I have reviewed all of the imaging during this hospital visit personally     Scheduled Meds: . feeding supplement (ENSURE ENLIVE)  237 mL Oral BID BM  . multivitamin with minerals  1 tablet Oral Daily  . pantoprazole  40 mg Oral BID  . valACYclovir  500 mg Oral Daily   Continuous Infusions: .  sodium chloride 200 mL (12/03/18 1109)     LOS: 14 days        Mauricio Gerome Apley, MD

## 2018-12-03 NOTE — Progress Notes (Signed)
Message sent to MD regarding Platelets (5) and WBC (2.2).

## 2018-12-04 LAB — CBC WITH DIFFERENTIAL/PLATELET
Abs Immature Granulocytes: 0 10*3/uL (ref 0.00–0.07)
Band Neutrophils: 16 %
Basophils Absolute: 0 10*3/uL (ref 0.0–0.1)
Basophils Relative: 0 %
Eosinophils Absolute: 0 10*3/uL (ref 0.0–0.5)
Eosinophils Relative: 1 %
HCT: 28.5 % — ABNORMAL LOW (ref 39.0–52.0)
Hemoglobin: 9.9 g/dL — ABNORMAL LOW (ref 13.0–17.0)
Lymphocytes Relative: 28 %
Lymphs Abs: 1.1 10*3/uL (ref 0.7–4.0)
MCH: 32.6 pg (ref 26.0–34.0)
MCHC: 34.7 g/dL (ref 30.0–36.0)
MCV: 93.8 fL (ref 80.0–100.0)
Monocytes Absolute: 0.2 10*3/uL (ref 0.1–1.0)
Monocytes Relative: 4 %
Myelocytes: 1 %
Neutro Abs: 2.6 10*3/uL (ref 1.7–7.7)
Neutrophils Relative %: 50 %
Platelets: 15 10*3/uL — CL (ref 150–400)
RBC: 3.04 MIL/uL — ABNORMAL LOW (ref 4.22–5.81)
RDW: 14.3 % (ref 11.5–15.5)
WBC: 4 10*3/uL (ref 4.0–10.5)
nRBC: 0 % (ref 0.0–0.2)

## 2018-12-04 LAB — PREPARE PLATELET PHERESIS: Unit division: 0

## 2018-12-04 LAB — TYPE AND SCREEN
ABO/RH(D): O POS
Antibody Screen: NEGATIVE

## 2018-12-04 LAB — BPAM PLATELET PHERESIS
Blood Product Expiration Date: 202006262359
ISSUE DATE / TIME: 202006261050
Unit Type and Rh: 5100

## 2018-12-04 MED ORDER — SODIUM CHLORIDE 0.9% IV SOLUTION
Freq: Once | INTRAVENOUS | Status: AC
  Administered 2018-12-04: 10:00:00 via INTRAVENOUS

## 2018-12-04 NOTE — Progress Notes (Signed)
PROGRESS NOTE    Victor Porter  CBJ:[id number] DOB: March 22, 1961 DOA: 11/19/2018 PCP: Patient, No Pcp Per    Brief Narrative:58 year old Spanish-speaking male who presents with generalized fatigue.  Reported worsening symptoms for last 4 weeks, positive weight loss, unintentional 10 kg over last 3 months, bleeding gums and multiple cutaneous ecchymosis.  On his initial physical examination he was hemodynamically stable with blood pressure 118/77, heart rate 60, respiratory rate 22, temperature 98, oxygen saturation 100%.  He had bleeding gums, his lungs were clear to auscultation, heart S1- S2 present rhythmic, abdomen was soft nontender, no lower extremity edema.  Skin had multiple ecchymosis left forehead along with his upper and lower extremities.  White cell count 3.1, hemoglobin 5.8, hematocrit 17.2, platelets less than 5.   He was admitted to the hospital for working diagnosis of severe pancytopenia  Assessment & Plan:   Principal Problem:   Pancytopenia ([place]) Active Problems:   Weight loss   Hypokalemia   Hyperbilirubinemia   1. Pancytopenia, with severe thrombocytopenia.  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  Apparently patient was exposed to tick bites recently. WBC up to 4  Hgb 9.9, Hct 28.5 Platelets 15 . Patient will get platelet transfusion again today.  Per Dr. Antonieta Pert note cytogenetics of the bone marrow is normal.  Plan on discharge tomorrow 12/05/2018. He will follow with Dr. Jerrye Noble from [facility] as outpatient.   2. Hypokalemia. Clinically resolved, renal function has been stable with serum cr at 0.87.   3. Hyperbilirubinemia. Clinically resolved.   4. Weight loss. Will need outpatient follow up.    DVT prophylaxis: scd   Code Status: full Family Communication: no family at the bedside  Disposition Plan/ discharge barriers: pending plt  improvement.        Nutrition Problem: Increased nutrient needs Etiology: acute illness     Signs/Symptoms: estimated needs    Interventions: Ensure Enlive (each supplement provides 350kcal and 20 grams of protein)  Estimated body mass index is 25.1 kg/m as calculated from the following:   Height as of this encounter: 5\' 9"  (1.753 m).   Weight as of this encounter: 77.1 kg.    Subjective: He is resting in bed he has 2 cell phones that he is sending message and texting and he looks happy and he says he is excellentto  Objective: Vitals:   12/03/18 1603 12/03/18 2000 12/03/18 2348 12/04/18 0334  BP: (!) 123/94 113/77 (!) 112/93 124/90  Pulse: 85 79 82 80  Resp: 18 17 17 19   Temp: 98.6 F (37 C) 99 F (37.2 C) 98.1 F (36.7 C) 98 F (36.7 C)  TempSrc: Oral Oral Oral Oral  SpO2: 100% 100% 100% 100%  Weight:      Height:        Intake/Output Summary (Last 24 hours) at 12/04/2018 0829 Last data filed at 12/03/2018 1557 Gross per 24 hour  Intake 1011 ml  Output -  Net 1011 ml   Filed Weights   11/19/18 0411 11/30/18 2000  Weight: 77.1 kg 77.1 kg    Examination:  General exam: Appears calm and comfortable  Respiratory system: Clear to auscultation. Respiratory effort normal. Cardiovascular system: S1 & S2 heard, RRR. No JVD, murmurs, rubs, gallops or clicks. No pedal edema. Gastrointestinal system: Abdomen is nondistended, soft and nontender. No organomegaly or masses felt. Normal bowel sounds heard. Central nervous system: Alert and oriented. No focal neurological deficits. Extremities: Symmetric  5 x 5 power. Skin: No rashes, lesions or ulcers Psychiatry: Judgement and insight appear normal. Mood & affect appropriate.     Data Reviewed: I have personally reviewed following labs and imaging studies  CBC: Recent Labs  Lab 11/30/18 1032 12/01/18 1155 12/02/18 0405 12/03/18 0345 12/04/18 0415  WBC 3.7* 3.1* 2.7* 2.2* 4.0  NEUTROABS 1.0* 1.0* 0.8*  0.7* PENDING  HGB 10.9* 11.6* 10.1* 10.4* 9.9*  HCT 31.5* 32.8* 28.6* 29.6* 28.5*  MCV 94.3 93.7 93.5 93.1 93.8  PLT 13* 9* 19* 5* 15*   Basic Metabolic Panel: Recent Labs  Lab 11/28/18 1234 11/29/18 0621 11/30/18 1032  NA 133* 135 135  K 3.5 4.1 3.9  CL 103 103 102  CO2 24 26 27   GLUCOSE 143* 111* 116*  BUN 28* 22* 25*  CREATININE 0.72 0.71 0.87  CALCIUM 8.6* 8.9 8.8*   GFR: Estimated Creatinine Clearance: 94.8 mL/min (by C-G formula based on SCr of 0.87 mg/dL). Liver Function Tests: Recent Labs  Lab 11/28/18 1234 11/29/18 0621 11/30/18 1032  AST 25 26 21   ALT 32 31 28  ALKPHOS 51 49 56  BILITOT 0.7 0.9 0.7  PROT 8.3* 8.5* 8.3*  ALBUMIN 3.1* 3.2* 3.2*   No results for input(s): LIPASE, AMYLASE in the last 168 hours. No results for input(s): AMMONIA in the last 168 hours. Coagulation Profile: No results for input(s): INR, PROTIME in the last 168 hours. Cardiac Enzymes: No results for input(s): CKTOTAL, CKMB, CKMBINDEX, TROPONINI in the last 168 hours. BNP (last 3 results) No results for input(s): PROBNP in the last 8760 hours. HbA1C: No results for input(s): HGBA1C in the last 72 hours. CBG: No results for input(s): GLUCAP in the last 168 hours. Lipid Profile: No results for input(s): CHOL, HDL, LDLCALC, TRIG, CHOLHDL, LDLDIRECT in the last 72 hours. Thyroid Function Tests: No results for input(s): TSH, T4TOTAL, FREET4, T3FREE, THYROIDAB in the last 72 hours. Anemia Panel: No results for input(s): VITAMINB12, FOLATE, FERRITIN, TIBC, IRON, RETICCTPCT in the last 72 hours. Sepsis Labs: No results for input(s): PROCALCITON, LATICACIDVEN in the last 168 hours.  No results found for this or any previous visit (from the past 240 hour(s)).       Radiology Studies: No results found.      Scheduled Meds: . sodium chloride   Intravenous Once  . feeding supplement (ENSURE ENLIVE)  237 mL Oral BID BM  . multivitamin with minerals  1 tablet Oral Daily  .  pantoprazole  40 mg Oral BID  . valACYclovir  500 mg Oral Daily   Continuous Infusions: . sodium chloride 200 mL (12/03/18 1109)     LOS: 15 days     Alwyn RenElizabeth G Raymond Bhardwaj, MD [facility] If 7PM-7AM, please contact night-coverage [email or web address] Password [email or web address] 12/04/2018, 8:29 AM

## 2018-12-04 NOTE — Progress Notes (Signed)
He had platelets yesterday.  His platelet count is 15,000 today.  Hopefully, this will go up a little bit more.  I would think about another dose of Nplate but it is too early for this.  His white cell count is 4000.  As such, he did have a little bit of response to the Neupogen.  I did get back the cytogenetics on his bone marrow.  Everything is normal.  As such, I would not think that this is a variant of hypoplastic myelodysplasia.  His vital signs all look good.  There is no evidence of any infection.  I just do not think this is anything related to his tick bites.  Just to be safe, I think that he probably will need another unit of platelets today before he is discharged.  I just want to make sure that his platelet count is adequate for discharge and await his appointment at [facility].  Hopefully that appointment will be early next week.  Lattie Haw, MD  Psalm 138:8

## 2018-12-05 LAB — CBC WITH DIFFERENTIAL/PLATELET
Abs Immature Granulocytes: 0.07 10*3/uL (ref 0.00–0.07)
Basophils Absolute: 0 10*3/uL (ref 0.0–0.1)
Basophils Relative: 0 %
Eosinophils Absolute: 0 10*3/uL (ref 0.0–0.5)
Eosinophils Relative: 0 %
HCT: 29.2 % — ABNORMAL LOW (ref 39.0–52.0)
Hemoglobin: 10 g/dL — ABNORMAL LOW (ref 13.0–17.0)
Immature Granulocytes: 2 %
Lymphocytes Relative: 41 %
Lymphs Abs: 1.9 10*3/uL (ref 0.7–4.0)
MCH: 32.2 pg (ref 26.0–34.0)
MCHC: 34.2 g/dL (ref 30.0–36.0)
MCV: 93.9 fL (ref 80.0–100.0)
Monocytes Absolute: 0.3 10*3/uL (ref 0.1–1.0)
Monocytes Relative: 6 %
Neutro Abs: 2.3 10*3/uL (ref 1.7–7.7)
Neutrophils Relative %: 51 %
Platelets: 17 10*3/uL — CL (ref 150–400)
RBC: 3.11 MIL/uL — ABNORMAL LOW (ref 4.22–5.81)
RDW: 14.5 % (ref 11.5–15.5)
WBC: 4.6 10*3/uL (ref 4.0–10.5)
nRBC: 0 % (ref 0.0–0.2)

## 2018-12-05 LAB — PREPARE PLATELET PHERESIS: Unit division: 0

## 2018-12-05 LAB — BPAM PLATELET PHERESIS
Blood Product Expiration Date: 202006292359
ISSUE DATE / TIME: 202006270915
Unit Type and Rh: 7300

## 2018-12-05 LAB — COMPREHENSIVE METABOLIC PANEL
ALT: 40 U/L (ref 0–44)
AST: 35 U/L (ref 15–41)
Albumin: 3.3 g/dL — ABNORMAL LOW (ref 3.5–5.0)
Alkaline Phosphatase: 73 U/L (ref 38–126)
Anion gap: 7 (ref 5–15)
BUN: 14 mg/dL (ref 6–20)
CO2: 24 mmol/L (ref 22–32)
Calcium: 8.7 mg/dL — ABNORMAL LOW (ref 8.9–10.3)
Chloride: 103 mmol/L (ref 98–111)
Creatinine, Ser: 0.76 mg/dL (ref 0.61–1.24)
GFR calc Af Amer: >60 mL/min (ref >60)
GFR calc non Af Amer: >60 mL/min (ref >60)
Glucose, Bld: 118 mg/dL — ABNORMAL HIGH (ref 70–99)
Potassium: 3.6 mmol/L (ref 3.5–5.1)
Sodium: 134 mmol/L — ABNORMAL LOW (ref 135–145)
Total Bilirubin: 0.6 mg/dL (ref 0.3–1.2)
Total Protein: 7.7 g/dL (ref 6.5–8.1)

## 2018-12-05 LAB — HEMOGLOBIN A1C
Hgb A1c MFr Bld: 5.8 % — ABNORMAL HIGH (ref 4.8–5.6)
Mean Plasma Glucose: 119.76 mg/dL

## 2018-12-05 NOTE — Discharge Summary (Signed)
Physician Discharge Summary  Victor Porter KKX:[id number] DOB: 10-07-1960 DOA: 11/19/2018  PCP: Patient, No Pcp Per  Admit date: 11/19/2018 Discharge date: 12/05/2018  Admitted From: Home Disposition: Home  Recommendations for Outpatient Follow-up:  1. Follow up with PCP in 1-2 weeks 2. Please obtain BMP/CBC in one week   Home Health: None  equipment/Devices: None Discharge Condition stable CODE STATUS: Full code Diet recommendation: Cardiac Brief/Interim Summary:58 year old Spanish-speaking male who presents with generalized fatigue. Reported worsening symptoms for last 4 weeks, positive weight loss, unintentional 10 kg over last 3 months,bleeding gums and multiplecutaneousecchymosis. On his initial physical examination he was hemodynamically stable with bloodpressure 118/77, heart rate 60, respiratoryrate22, temperature 98, oxygen saturation 100%. He had bleeding gums,his lungswere clear to auscultation, heartS1- S2present rhythmic, abdomen was soft nontender, no lower extremity edema. Skin had multiple ecchymosis left forehead along with hisupper and lower extremities. White cell count 3.1, hemoglobin 5.8, hematocrit 17.2, platelets less than 5.  He was admitted to the hospital for working diagnosis of severe pancytopenia   Discharge Diagnoses:  Principal Problem:   Pancytopenia ([place]) Active Problems:   Weight loss   Hypokalemia   Hyperbilirubinemia  Pancytopenia of unclear etiology: The  patient presented with complaints of generalized weaknesseasy bruising bleeding gums and weight loss. Upon admission his white count was 3.1 hemoglobin 5.8 platelet count less than 5. B12 iron and folate level normal LDL 298 d-dimer 1.63 fibrinogen 317 reticulocyte count normal. Patient has received multiple units of packed RBCs and platelet transfusion and Neupogen..Hepatitis C viral antibody negative HIV negative coronavirus negative. Patienthad bone marrow  biopsy done 11/22/2018.  No signs of leukemia by bone marrow biopsy. CT scan abdomenno evidence of cirrhosis normal spleen volume no ascites. RN removed 3 ticks from his body.  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  But hematology seems to think pancytopenia had nothing to do with tick bites. Antibody to ehrlichiosis sent and are negative bone marrow biopsy has resulted and is negative for leukemia. 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  Oncology treated him with IVIG and steroids without much improvement in his counts.  Dr. Marin Olp spoke with Dr. Florene Glen oncology head at [facility] who the patient will follow-up with after discharge as early as next week.  During his hospital stay he also received IV steroids this has caused his blood sugar abnormalities.  We will need to keep an eye on his blood sugar his hemoglobin A1c is 5.8.  He will not be discharged on any IV steroids at this time.  He also received Valtrex for 10 days when his white count was very low.      Nutrition Problem: Increased nutrient needs Etiology: acute illness    Signs/Symptoms: estimated needs     Interventions: Ensure Enlive (each supplement provides 350kcal and 20 grams of protein)  Estimated body mass index is 25.1 kg/m as calculated from the following:   Height as of this encounter: 5' 9"  (1.753 m).   Weight as of this encounter: 77.1 kg.  Discharge Instructions  Discharge Instructions    Call MD for:  difficulty breathing, headache or visual disturbances   Complete by: As directed    Call MD for:  persistant dizziness or light-headedness   Complete by: As directed    Call MD for:  persistant nausea and vomiting   Complete by: As directed    Diet - low sodium heart healthy   Complete by: As directed    Increase activity  slowly   Complete by: As directed      Allergies as of 12/05/2018   No Known Allergies     Medication List     You have not been prescribed any medications.    Follow-up [facility] Follow up on 12/02/2018.   Why: telephone visit 11:10  Contact information: [address] [place] [address] [phone]         No Known Allergies  Consultations: Dr. Marin Olp  Procedures/Studies: Dg Chest 2 View  Result Date: 11/20/2018 CLINICAL DATA:  Pancytopenia EXAM: CHEST - 2 VIEW COMPARISON:  None. FINDINGS: The heart, hila, mediastinum, lungs, and pleura are normal. Mild prominence of the pleural stripe laterally on the left. Blunting of the costophrenic angle on the lateral view and probable fluid in 1 of the fissures on the lateral view. The lungs are otherwise clear. No other acute abnormalities. IMPRESSION: Mild prominence of the pleural stripe laterally on the left, blunting of the costophrenic angle on the lateral view, and probable fluid in a fissure on the lateral view may all represent a small amount of pleural fluid. No other abnormalities. Recommend follow-up to resolution. Electronically Signed   By: Dorise Bullion III M.D   On: 11/20/2018 12:40   Dg Knee 1-2 Views Right  Result Date: 11/26/2018 CLINICAL DATA:  Right knee pain. EXAM: RIGHT KNEE - 1-2 VIEW COMPARISON:  None. FINDINGS: No evidence of fracture, dislocation, or joint effusion. No evidence of arthropathy or other focal bone abnormality. Soft tissues are unremarkable. IMPRESSION: Negative. Electronically Signed   By: Fidela Salisbury M.D.   On: 11/26/2018 20:31   Ct Abdomen Pelvis W Contrast  Result Date: 11/20/2018 CLINICAL DATA:  Cirrhosis or Fatty Liver pancytopenia -- evaluate for large liver or spleen EXAM: CT ABDOMEN AND PELVIS WITH CONTRAST TECHNIQUE: Multidetector CT imaging of the abdomen and pelvis was performed using the standard protocol following bolus administration of intravenous contrast. CONTRAST:  190m OMNIPAQUE IOHEXOL 300 MG/ML  SOLN  COMPARISON:  None. FINDINGS: Lower chest: Lung bases are clear. Hepatobiliary: Liver has a normal contour. Simple cysts in the RIGHT hepatic lobe. No biliary duct dilatation. Gallbladder normal. Pancreas: Pancreas is normal. No ductal dilatation. No pancreatic inflammation. Spleen: Normal spleen. Spleen measures 7.4 cm in craniocaudad dimension. Adrenals/urinary tract: Adrenal glands normal. Simple fluid cyst extending from the RIGHT kidney. No ureteral obstruction. Bladder normal Stomach/Bowel: Stomach, small bowel, appendix, and cecum are normal. The colon and rectosigmoid colon are normal. Vascular/Lymphatic: Abdominal aorta is normal caliber with atherosclerotic calcification. There is no retroperitoneal or periportal lymphadenopathy. Abdominal aorta is normal caliber. There is no retroperitoneal or periportal lymphadenopathy. No pelvic lymphadenopathy. Reproductive: Prostate normal. Other: No free fluid. Musculoskeletal: No aggressive osseous lesion. Endplate compression deformity at T12 with Schmorl's node. IMPRESSION: 1. No CT evidence of cirrhosis. 2. Normal spleen volume. 3. No ascites. Electronically Signed   By: SSuzy BouchardM.D.   On: 11/20/2018 16:52   Ct Bone Marrow Biopsy & Aspiration  Result Date: 11/22/2018 INDICATION: 58year old with pancytopenia.  Request for bone marrow biopsy. EXAM: CT GUIDED BONE MARROW ASPIRATES AND BIOPSY Physician: AStephan Minister HAnselm Pancoast MD MEDICATIONS: None. ANESTHESIA/SEDATION: Fentanyl 100 mcg IV; Versed 2.0 mg IV Moderate Sedation Time:  16 minutes The patient was continuously monitored during the procedure by the interventional radiology nurse under my direct supervision. COMPLICATIONS: None immediate. PROCEDURE: The procedure was explained to the patient with a SPatent attorney The risks and benefits  of the procedure were discussed and the patient's questions were addressed. Informed consent was obtained from the patient. The patient was placed prone on CT table.  Images of the pelvis were obtained. The right side of back was prepped and draped in sterile fashion. The skin and right posterior ilium were anesthetized with 1% lidocaine. OnControl bone needle was directed in the posterior right ilium with CT guidance. Needle position was confirmed within the bone. The needle was slightly advanced into the bone using the drill. Two adequate aspirates were obtained. Subsequently, the bone drill was used to obtain a core biopsy. Adequate core biopsy was obtained. Bandage placed over the puncture site. IMPRESSION: CT guided bone marrow aspiration and core biopsy. Electronically Signed   By: Markus Daft M.D.   On: 11/22/2018 10:55    (Echo, Carotid, EGD, Colonoscopy, ERCP)    Subjective: Patient is resting in bed anxious to go home no complaints  Discharge Exam: Vitals:   12/05/18 0407 12/05/18 0832  BP: 130/89 136/85  Pulse: 68 75  Resp: (!) 22 20  Temp: 98.5 F (36.9 C) 98.1 F (36.7 C)  SpO2: 100% 100%   Vitals:   12/04/18 1946 12/04/18 2345 12/05/18 0407 12/05/18 0832  BP: 112/83 123/78 130/89 136/85  Pulse: 86 72 68 75  Resp: 17 18 (!) 22 20  Temp: 99.2 F (37.3 C) 98.9 F (37.2 C) 98.5 F (36.9 C) 98.1 F (36.7 C)  TempSrc: Oral Oral Oral Oral  SpO2: 100% 100% 100% 100%  Weight:      Height:        General: Pt is alert, awake, not in acute distress Cardiovascular: RRR, S1/S2 +, no rubs, no gallops Respiratory: CTA bilaterally, no wheezing, no rhonchi Abdominal: Soft, NT, ND, bowel sounds + Extremities: no edema, no cyanosis    The results of significant diagnostics from this hospitalization (including imaging, microbiology, ancillary and laboratory) are listed below for reference.     Microbiology: No results found for this or any previous visit (from the past 240 hour(s)).   Labs: BNP (last 3 results) No results for input(s): BNP in the last 8760 hours. Basic Metabolic Panel: Recent Labs  Lab 11/28/18 1234 11/29/18 0621  11/30/18 1032 12/05/18 0853  NA 133* 135 135 134*  K 3.5 4.1 3.9 3.6  CL 103 103 102 103  CO2 24 26 27 24   GLUCOSE 143* 111* 116* 118*  BUN 28* 22* 25* 14  CREATININE 0.72 0.71 0.87 0.76  CALCIUM 8.6* 8.9 8.8* 8.7*   Liver Function Tests: Recent Labs  Lab 11/28/18 1234 11/29/18 0621 11/30/18 1032 12/05/18 0853  AST 25 26 21  35  ALT 32 31 28 40  ALKPHOS 51 49 56 73  BILITOT 0.7 0.9 0.7 0.6  PROT 8.3* 8.5* 8.3* 7.7  ALBUMIN 3.1* 3.2* 3.2* 3.3*   No results for input(s): LIPASE, AMYLASE in the last 168 hours. No results for input(s): AMMONIA in the last 168 hours. CBC: Recent Labs  Lab 12/01/18 1155 12/02/18 0405 12/03/18 0345 12/04/18 0415 12/05/18 0621  WBC 3.1* 2.7* 2.2* 4.0 4.6  NEUTROABS 1.0* 0.8* 0.7* 2.6 2.3  HGB 11.6* 10.1* 10.4* 9.9* 10.0*  HCT 32.8* 28.6* 29.6* 28.5* 29.2*  MCV 93.7 93.5 93.1 93.8 93.9  PLT 9* 19* 5* 15* 17*   Cardiac Enzymes: No results for input(s): CKTOTAL, CKMB, CKMBINDEX, TROPONINI in the last 168 hours. BNP: Invalid input(s): POCBNP CBG: No results for input(s): GLUCAP in the last 168 hours. D-Dimer No results  for input(s): DDIMER in the last 72 hours. Hgb A1c Recent Labs    12/05/18 0853  HGBA1C 5.8*   Lipid Profile No results for input(s): CHOL, HDL, LDLCALC, TRIG, CHOLHDL, LDLDIRECT in the last 72 hours. Thyroid function studies No results for input(s): TSH, T4TOTAL, T3FREE, THYROIDAB in the last 72 hours.  Invalid input(s): FREET3 Anemia work up No results for input(s): VITAMINB12, FOLATE, FERRITIN, TIBC, IRON, RETICCTPCT in the last 72 hours. Urinalysis No results found for: COLORURINE, APPEARANCEUR, LABSPEC, [place], GLUCOSEU, HGBUR, BILIRUBINUR, KETONESUR, PROTEINUR, UROBILINOGEN, NITRITE, LEUKOCYTESUR Sepsis Labs Invalid input(s): PROCALCITONIN,  WBC,  LACTICIDVEN Microbiology No results found for this or any previous visit (from the past 240 hour(s)).   Time coordinating discharge:  35  minutes  SIGNED:   Georgette Shell, MD  [facility] 12/05/2018, 10:13 AM  If 7PM-7AM, please contact night-coverage [email or web address] Password [email or web address]

## 2018-12-06 ENCOUNTER — Other Ambulatory Visit: Payer: Self-pay

## 2018-12-06 ENCOUNTER — Ambulatory Visit (INDEPENDENT_AMBULATORY_CARE_PROVIDER_SITE_OTHER): Payer: Self-pay | Admitting: Family Medicine

## 2018-12-06 ENCOUNTER — Encounter: Payer: Self-pay | Admitting: Family Medicine

## 2018-12-06 VITALS — BP 113/67 | HR 85 | Temp 99.0°F | Resp 14 | Ht 70.0 in | Wt 174.0 lb

## 2018-12-06 DIAGNOSIS — Z0000 Encounter for general adult medical examination without abnormal findings: Secondary | ICD-10-CM

## 2018-12-06 DIAGNOSIS — D61818 Other pancytopenia: Secondary | ICD-10-CM

## 2018-12-06 DIAGNOSIS — Z23 Encounter for immunization: Secondary | ICD-10-CM

## 2018-12-06 DIAGNOSIS — Z789 Other specified health status: Secondary | ICD-10-CM

## 2018-12-06 DIAGNOSIS — E876 Hypokalemia: Secondary | ICD-10-CM

## 2018-12-06 DIAGNOSIS — R634 Abnormal weight loss: Secondary | ICD-10-CM

## 2018-12-06 NOTE — Progress Notes (Signed)
Patient Victor Porter and [facility]  New Patient Encounter Provider: Lanae Boast, [place]    JGG:[id number]  LYY:[id number]  DOB - [birth year]  SUBJECTIVE:   Victor Porter, is a 58 y.o. male who presents to establish care with this clinic.  He is accompanied by an interpretor. Patient released from the hospital yesterday and was diagnosed  With severe pancytopenia of unclear etiology. The patient presented to the ED with complaints of generalized weaknesseasy bruising bleeding gums and weight loss. Upon admission his white count was 3.1 hemoglobin 5.8 platelet count less than 5. B12 iron and folate level normal LDL 298 d-dimer 1.63 fibrinogen 317 reticulocyte count normal. Patient has received multiple units of packed RBCs and platelet transfusion and Neupogen. Negative Hep C and HIV. COVID negative.  Patienthad bone marrow biopsy done 11/22/2018.   Oncology treated him with IVIG and steroids without much improvement in his counts.  Dr. Marin Olp spoke with Dr. Florene Glen oncology head at [facility] who the patient will follow-up with after discharge as early as next week.  During his hospital stay he also received IV steroids this has caused his blood sugar abnormalities.  We will need to keep an eye on his blood sugar his hemoglobin A1c is 5.8.   Patient reports that he is from [place] and his wife is still there. He reports mild dizziness and was released from the hospital yesterday.  No Known Allergies History reviewed. No pertinent past medical history. No current outpatient medications on file prior to visit.   No current facility-administered medications on file prior to visit.    Family History  Problem Relation Age of Onset  . Stomach cancer Father    Social History   Socioeconomic History  . Marital status: Married    Spouse name: Not on file  . Number of children: Not on file  . Years of education: Not on file  . Highest education  level: Not on file  Occupational History  . Not on file  Social Needs  . Financial resource strain: Not on file  . Food insecurity    Worry: Not on file    Inability: Not on file  . Transportation needs    Medical: Not on file    Non-medical: Not on file  Tobacco Use  . Smoking status: Never Smoker  . Smokeless tobacco: Never Used  Substance and Sexual Activity  . Alcohol use: Never    Frequency: Never  . Drug use: Never  . Sexual activity: Not on file  Lifestyle  . Physical activity    Days per week: Not on file    Minutes per session: Not on file  . Stress: Not on file  Relationships  . Social Herbalist on phone: Not on file    Gets together: Not on file    Attends religious service: Not on file    Active member of club or organization: Not on file    Attends meetings of clubs or organizations: Not on file    Relationship status: Not on file  . Intimate partner violence    Fear of current or ex partner: Not on file    Emotionally abused: Not on file    Physically abused: Not on file    Forced sexual activity: Not on file  Other Topics Concern  . Not on file  Social History Narrative  . Not on file    Review of Systems  Constitutional: Negative.   HENT:  Negative.   Eyes: Negative.   Respiratory: Negative.   Cardiovascular: Negative.   Gastrointestinal: Negative.   Genitourinary: Negative.   Musculoskeletal: Negative.   Skin: Negative.   Neurological: Positive for dizziness.  Endo/Heme/Allergies: Bruises/bleeds easily.  Psychiatric/Behavioral: Negative.      OBJECTIVE:    BP 113/67 (BP Location: Left Arm, Patient Position: Sitting, Cuff Size: Normal)   Pulse 85   Temp 99 F (37.2 C) (Oral)   Resp 14   Ht _0  (1.778 m)   Wt 174 lb (78.9 kg)   SpO2 100%   BMI 24.97 kg/m   Physical Exam  Constitutional: He is oriented to person, place, and time and well-developed, well-nourished, and in no distress. No distress.  HENT:  Head:  Normocephalic and atraumatic.  Eyes: Pupils are equal, round, and reactive to light. Conjunctivae and EOM are normal.  Neck: Normal range of motion. Neck supple.  Cardiovascular: Normal rate, regular rhythm and intact distal pulses. Exam reveals no gallop and no friction rub.  No murmur heard. Pulmonary/Chest: Effort normal and breath sounds normal. No respiratory distress. He has no wheezes.  Abdominal: Soft. Bowel sounds are normal. There is no abdominal tenderness.  Musculoskeletal: Normal range of motion.        General: No tenderness or edema.  Lymphadenopathy:    He has no cervical adenopathy.  Neurological: He is alert and oriented to person, place, and time. Gait normal.  Skin: Skin is warm and dry.  Psychiatric: Mood, memory, affect and judgment normal.  He had some rapid speech and is constantly moving. He asks for the phone number of my nurse "to be friends''.Interpreter explained that this was inappropriate behavior.  Unsure of patient's baseline capacity as this is the first meeting. He is passing out handwritten papers with portions of the [place] written on them.   Nursing note and vitals reviewed.    ASSESSMENT/PLAN: 1. Pancytopenia [facility]) Appointment made for patient to see hematology at [facility] for this week. Patient aware of the appointment and given information during the office visit.  2. Hypokalemia- listed in chart. Resolved. Continues with hypocalcemia with last level at 8.6.  3. Weight loss 4. Hyperbilirubinemia 5. Encounter for medical examination to establish care Patient seen by specialists at the present time. He was released from the hospital yesterday and labs were reviewed. PCP will continue to monitor and coordinate care.  6. Language Barrier to Actuary services utilized throughout the encounter.    Return in about 4 weeks (around 01/03/2019), or if symptoms worsen or fail to improve.  The patient was given clear  instructions to go to ER or return to medical center if symptoms don't improve, worsen or new problems develop. The patient verbalized understanding. The patient was told to call to get lab results if they haven't heard anything in the next week.     This note has been created with Surveyor, quantity. Any transcriptional errors are unintentional.   Ms. Andr L. Nathaneil Canary, FNP-BC [facility] [address] [place], [place] [address] [phone]

## 2018-12-06 NOTE — Patient Instructions (Signed)
Mantenimiento de la salud en los hombres Health Maintenance, Male Adoptar un estilo de vida saludable y recibir atencin preventiva son importantes para promover la salud y el bienestar. Consulte al mdico sobre:  El esquema adecuado para hacerse pruebas y exmenes peridicos.  Cosas que puede hacer por su cuenta para prevenir enfermedades y mantenerse sano. Qu debo saber sobre la dieta, el peso y el ejercicio? Consuma una dieta saludable   Consuma una dieta que incluya muchas verduras, frutas, productos lcteos con bajo contenido de grasa y protenas magras.  No consuma muchos alimentos ricos en grasas slidas, azcares agregados o sodio. Mantenga un peso saludable El ndice de masa muscular (IMC) es una medida que puede utilizarse para identificar posibles problemas de peso. Proporciona una estimacin de la grasa corporal basndose en el peso y la altura. Su mdico puede ayudarle a determinar su IMC y a lograr o mantener un peso saludable. Haga ejercicio con regularidad Haga ejercicio con regularidad. Esta es una de las prcticas ms importantes que puede hacer por su salud. La mayora de los adultos deben seguir estas pautas:  Realizar, al menos, 150minutos de actividad fsica por semana. El ejercicio debe aumentar la frecuencia cardaca y hacerlo transpirar (ejercicio de intensidad moderada).  Hacer ejercicios de fortalecimiento por lo menos dos veces por semana. Agregue esto a su plan de ejercicio de intensidad moderada.  Pasar menos tiempo sentados. Incluso la actividad fsica ligera puede ser beneficiosa. Controle sus niveles de colesterol y lpidos en la sangre Comience a realizarse anlisis de lpidos y colesterol en la sangre a los 20aos y luego reptalos cada 5aos. Es posible que necesite controlar los niveles de colesterol con mayor frecuencia si:  Sus niveles de lpidos y colesterol son altos.  Es mayor de 40aos.  Presenta un alto riesgo de padecer enfermedades  cardacas. Qu debo saber sobre las pruebas de deteccin del cncer? Muchos tipos de cncer pueden detectarse de manera temprana y, a menudo, pueden prevenirse. Segn su historia clnica y sus antecedentes familiares, es posible que deba realizarse pruebas de deteccin del cncer en diferentes edades. Esto puede incluir pruebas de deteccin de lo siguiente:  Cncer colorrectal.  Cncer de prstata.  Cncer de piel.  Cncer de pulmn. Qu debo saber sobre la enfermedad cardaca, la diabetes y la hipertensin arterial? Presin arterial y enfermedad cardaca  La hipertensin arterial causa enfermedades cardacas y aumenta el riesgo de accidente cerebrovascular. Es ms probable que esto se manifieste en las personas que tienen lecturas de presin arterial alta, tienen ascendencia africana o tienen sobrepeso.  Hable con el mdico sobre sus valores de presin arterial deseados.  Hgase controlar la presin arterial: ? Cada 3 a 5 aos si tiene entre 18 y 39 aos. ? Todos los aos si es mayor de 40aos.  Si tiene entre 65 y 75 aos y es fumador o sola fumar, pregntele al mdico si debe realizarse una prueba de deteccin de aneurisma artico abdominal (AAA) por nica vez. Diabetes Realcese exmenes de deteccin de la diabetes con regularidad. Este anlisis revisa el nivel de azcar en la sangre en ayunas. Hgase las pruebas de deteccin:  Cada tresaos despus de los 45aos de edad si tiene un peso normal y un bajo riesgo de padecer diabetes.  Con ms frecuencia y a partir de una edad inferior si tiene sobrepeso o un alto riesgo de padecer diabetes. Qu debo saber sobre la prevencin de infecciones? Hepatitis B Si tiene un riesgo ms alto de contraer hepatitis B, debe someterse   a un examen de deteccin de este virus. Hable con el mdico para averiguar si tiene riesgo de contraer la infeccin por hepatitis B. Hepatitis C Se recomienda un anlisis de sangre para:  Todos los que  nacieron entre 1945 y 1965.  Todas las personas que tengan un riesgo de haber contrado hepatitis C. Enfermedades de transmisin sexual (ETS)  Debe realizarse pruebas de deteccin de ITS todos los aos, incluidas la gonorrea y la clamidia, si: ? Es sexualmente activo y es menor de 24aos. ? Es mayor de 24aos, y el mdico le informa que corre riesgo de tener este tipo de infecciones. ? La actividad sexual ha cambiado desde que le hicieron la ltima prueba de deteccin y tiene un riesgo mayor de tener clamidia o gonorrea. Pregntele al mdico si usted tiene riesgo.  Pregntele al mdico si usted tiene un alto riesgo de contraer VIH. El mdico tambin puede recomendarle un medicamento recetado para ayudar a evitar la infeccin por el VIH. Si elige tomar medicamentos para prevenir el VIH, primero debe hacerse los anlisis de deteccin del VIH. Luego debe hacerse anlisis cada 3meses mientras est tomando los medicamentos. Siga estas instrucciones en su casa: Estilo de vida  No consuma ningn producto que contenga nicotina o tabaco, como cigarrillos, cigarrillos electrnicos y tabaco de mascar. Si necesita ayuda para dejar de fumar, consulte al mdico.  No consuma drogas.  No comparta agujas.  Solicite ayuda a su mdico si necesita apoyo o informacin para abandonar las drogas. Consumo de alcohol  No beba alcohol si el mdico se lo prohbe.  Si bebe alcohol: ? Limite la cantidad que consume de 0 a 2 medidas por da. ? Est atento a la cantidad de alcohol que hay en las bebidas que toma. En los [place], una medida equivale a una botella de cerveza de 12oz (355ml), un vaso de vino de 5oz (148ml) o un vaso de una bebida alcohlica de alta graduacin de 1oz (44ml). Instrucciones generales  Realcese los estudios de rutina de la salud, dentales y de la vista.  Mantngase al da con las vacunas.  Infrmele a su mdico si: ? Se siente deprimido con frecuencia. ? Alguna vez  ha sido vctima de maltrato o no se siente seguro en su casa. Resumen  Adoptar un estilo de vida saludable y recibir atencin preventiva son importantes para promover la salud y el bienestar.  Siga las instrucciones del mdico acerca de una dieta saludable, el ejercicio y la realizacin de pruebas o exmenes para detectar enfermedades.  Siga las instrucciones del mdico con respecto al control del colesterol y la presin arterial. Esta informacin no tiene como fin reemplazar el consejo del mdico. Asegrese de hacerle al mdico cualquier pregunta que tenga. Document Released: 11/22/2007 Document Revised: 06/16/2018 Document Reviewed: 06/16/2018 Elsevier Patient Education  2020 Elsevier Inc.  

## 2019-01-03 ENCOUNTER — Ambulatory Visit (INDEPENDENT_AMBULATORY_CARE_PROVIDER_SITE_OTHER): Payer: Self-pay | Admitting: Family Medicine

## 2019-01-03 ENCOUNTER — Encounter: Payer: Self-pay | Admitting: Family Medicine

## 2019-01-03 ENCOUNTER — Other Ambulatory Visit: Payer: Self-pay

## 2019-01-03 VITALS — BP 125/74 | HR 72 | Temp 98.5°F | Resp 14 | Ht 70.0 in | Wt 177.0 lb

## 2019-01-03 DIAGNOSIS — E876 Hypokalemia: Secondary | ICD-10-CM

## 2019-01-03 DIAGNOSIS — D61818 Other pancytopenia: Secondary | ICD-10-CM

## 2019-01-03 NOTE — Progress Notes (Signed)
  Patient [name]   Progress Note: General Provider: Lanae Boast, FNP  SUBJECTIVE:   Victor Porter is a 57 y.o. male who  has no past medical history on file.. Patient presents today for Follow-up  Patient is seen by heme/onc for pancytopenia. He is no longer having dizziness. He states that he is feeling much better but is concerned about the "blood spots" that he is experiencing. He states that the bruises are lessening but continue to present.    Review of Systems  Constitutional: Negative.   HENT: Negative.   Eyes: Negative.   Respiratory: Negative.   Cardiovascular: Negative.   Gastrointestinal: Negative.   Genitourinary: Negative.   Musculoskeletal: Negative.   Skin: Negative.   Neurological: Negative.   Endo/Heme/Allergies: Bruises/bleeds easily.  Psychiatric/Behavioral: Negative.      OBJECTIVE: BP 125/74 (BP Location: Left Arm, Patient Position: Sitting, Cuff Size: Normal)   Pulse 72   Temp 98.5 F (36.9 C) (Oral)   Resp 14   Ht 5\' 10"  (1.778 m)   Wt 177 lb (80.3 kg)   SpO2 100%   BMI 25.40 kg/m   Wt Readings from Last 3 Encounters:  01/03/19 177 lb (80.3 kg)  12/06/18 174 lb (78.9 kg)  11/30/18 169 lb 15.6 oz (77.1 kg)     Physical Exam Vitals signs and nursing note reviewed.  Constitutional:      General: He is not in acute distress.    Appearance: Normal appearance.  HENT:     Head: Normocephalic and atraumatic.  Eyes:     Extraocular Movements: Extraocular movements intact.     Conjunctiva/sclera: Conjunctivae normal.     Pupils: Pupils are equal, round, and reactive to light.  Cardiovascular:     Rate and Rhythm: Normal rate and regular rhythm.     Heart sounds: No murmur.  Pulmonary:     Effort: Pulmonary effort is normal.     Breath sounds: Normal breath sounds.  Musculoskeletal: Normal range of motion.  Skin:    General: Skin is warm and dry.  Neurological:     Mental Status: He is  alert and oriented to person, place, and time.  Psychiatric:        Mood and Affect: Mood normal.        Behavior: Behavior normal.        Thought Content: Thought content normal.        Judgment: Judgment normal.     ASSESSMENT/PLAN:   1. Pancytopenia (Merna) Patient is being followed by a specialist for this condition. Patient advised to continue with follow up appointments. PCP will continue to monitor progress.    2. Hypokalemia Resolved. Last potassium level 4.6 on 12/23/2018. Will continue to monitor.    Return in about 3 months (around 04/05/2019) for htn.    The patient was given clear instructions to go to ER or return to medical center if symptoms do not improve, worsen or new problems develop. The patient verbalized understanding and agreed with plan of care.   Ms. Doug Sou. Nathaneil Canary, FNP-BC Patient [facility] [address] [place], [place] [address] [phone]

## 2019-01-03 NOTE — Patient Instructions (Signed)
Pancitopenia Pancytopenia La pancitopenia es una afeccin que ocurre cuando una persona tiene una cantidad anormalmente baja (deficiencia) de las siguientes clulas sanguneas:  Glbulos rojos (GR). La presencia de muy pocos glbulos rojos se denomina "anemia".  Glbulos blancos (GB). La presencia de muy pocos glbulos blancos se denomina "leucopenia".  Clulas que intervienen en la coagulacin de la sangre (plaquetas). La presencia de muy pocas plaquetas se denomina "trombocitopenia". Las clulas que se convierten en clulas sanguneas (clulas madre) se producen en el tejido blando que est dentro de los huesos (mdula sea). Todas las clulas sanguneas tienen una vida limitada. Las clulas sanguneas son reemplazadas constantemente por clulas sanguneas nuevas, producidas en la mdula sea. La causa de la pancitopenia puede ser cualquier afeccin o enfermedad que:  Destruye la capacidad de la mdula sea para producir clulas sanguneas.  Hace que la mdula sea produzca clulas sanguneas que no sobreviven despus de salir de la mdula sea. Cules son las causas? Esta afeccin tiene muchas causas posibles. En algunos casos, se desconoce la causa. Las causas ms frecuentes de la afeccin incluyen las siguientes:  Una enfermedad que hace que la mdula sea produzca clulas sanguneas inmaduras (anemia megaloblstica).  Un trastorno de la sangre que hace que la mdula sea no pueda producir suficientes glbulos rojos nuevos (anemia aplsica o insuficiencia de la mdula sea).  El agrandamiento del bazo (hiperesplenismo). El bazo agrandado puede atrapar las clulas sanguneas y destruirlas ms rpido de lo que pueden reemplazarse.  Enfermedades hereditarias de la sangre o de la mdula sea.  Cnceres que afectan la mdula sea.  Ciertos medicamentos, como los siguientes: ? Quimioterapia. ? Medicamentos que reducen la actividad del sistema inmunitario (inmunodepresores).  Exposicin  a la radiacin.  Infecciones graves. Qu incrementa el riesgo? Es ms probable que contraiga esta afeccin si:  Cleone Slimiene entre 30 y 3550 aos de edad.  Es hombre.  Tiene antecedentes familiares de una enfermedad de la sangre o de la mdula sea.  Tiene determinadas afecciones, por ejemplo: ? Trastorno por consumo de alcohol. ? VIH (virus de inmunodeficiencia humana) o sida (sndrome de inmunodeficiencia adquirida). ? Cncer. ? Afecciones en las que el sistema del organismo encargado de combatir las enfermedades ataca los tejidos normales (enfermedades autoinmunitarias). Cules son los signos o los sntomas? Los sntomas de esta afeccin varan en funcin de la causa, y pueden incluir:  Anemia.  Debilidad.  Falta de aire.  Sangrado y moretones inusuales.  Infecciones frecuentes.  Fatiga.  Grant RutsFiebre.  Piel plida.  Dolor R.R. Donnelleyen los huesos.  Sudoracin nocturna.  Prdida de peso.  Dolor de Turkmenistancabeza.  Mareos.  Sentir un fro inusual. Cmo se diagnostica? Esta afeccin se puede diagnosticar en funcin de lo siguiente:  Sus sntomas.  Sus antecedentes mdicos.  Un examen fsico.  Estudios. Estos pueden incluir: ? La extraccin de Lauris Poaguna muestra de mdula sea para examinarla con un microscopio (biopsia). Esto se realiza introduciendo una aguja en un hueso para extraer lquido y clulas (aspiracin). ? Un hemograma completo. Es un grupo de anlisis que mide las caractersticas de los glbulos blancos, los glbulos rojos y las plaquetas. ? Un frotis de Careers information officersangre perifrica. En este anlisis, la sangre se examina con un microscopio para obtener informacin Apache Corporationsobre los medicamentos y las enfermedades que Ameren Corporationafectan los glbulos rojos, los glbulos blancos y las plaquetas. ? Recuento de reticulocitos. Este anlisis mide la cantidad de glbulos rojos nuevos o inmaduros (reticulocitos) que produce la mdula sea. ? Estudios de diagnstico por imgenes del bazo o el hgado,  como  radiografas. ? Un anlisis para medir el nivel de vitamina B12. ? Anlisis para detectar la presencia de virus. Cmo se trata? El tratamiento de esta afeccin depende de la causa. El tratamiento puede incluir:  Inmunodepresores.  Antibiticos.  Vitamina B12. Esta puede administrarse como tratamiento para la anemia megaloblstica.  Medicamentos que ayudan a la mdula sea a producir clulas sanguneas (medicamentos estimulantes de la mdula sea).  Un trasplante de mdula sea.  Recibir sangre de un donante a travs de una va intravenosa (transfusin de Maria Antonia).  Un procedimiento para extirpar el bazo (esplenectoma). Puede hacerse como tratamiento para el hiperesplenismo. Siga estas indicaciones en su casa: Cuidado del cuerpo      [employer] manos frecuentemente con agua y [place]. Use desinfectante para manos si no dispone de [place] y [place].  Cepllese los [employer] veces por da y psese el hilo dental al menos una vez por da. Es recomendable que vaya al dentista cada seis meses.  Mantngase al da con las vacunas, incluso con la vacuna antigripal anual. Consulte al [employer] las vacunas que debe aplicarse. Estas pueden incluir la vacuna contra la neumona. Medicamentos  [employer] de venta libre y los recetados solamente como se lo haya indicado el mdico.  Si le recetaron un antibitico, tmelo como se lo haya indicado el mdico. No deje de tomar el antibitico aunque comience a sentirse mejor. Estilo de vida  No participe en deportes de contacto ni otras actividades peligrosas. Pregntele al mdico qu actividades son seguras para usted.  Durante la temporada de resfro y gripe, evite los lugares muy concurridos y el contacto con personas que estn enfermas. La temporada de gripe generalmente es entre los meses de [place] y [place]. Indicaciones generales  Trabaje con el mdico para controlar la afeccin e informarse acerca de ella.  Siga las  recomendaciones con respecto a la seguridad de los alimentos como se lo haya indicado el mdico.  Consulting civil engineer a todas las visitas de control como se lo haya indicado el mdico. Esto es importante. Comunquese con un mdico si:  Tiene fiebre.  Tiene moretones o sangra fcilmente.  Est mareado.  Se siente dbil o cansado de un modo que no es habitual. Solicite ayuda inmediatamente si tiene:  Sangrado que no se detiene.  Sibilancias o falta de aire.  Dolor en el pecho. Estos sntomas pueden representar un problema grave que constituye Engineer, maintenance (IT). No espere a ver si los sntomas desaparecen. Solicite atencin mdica de inmediato. Comunquese con el servicio de emergencias de su localidad (911 en los [place]). No conduzca por sus propios medios [employer]. Resumen  La pancitopenia es una afeccin que ocurre cuando una persona tiene una cantidad anormalmente baja de glbulos rojos, glbulos blancos y plaquetas.  Esta afeccin tiene muchas causas posibles.  El tratamiento de esta afeccin depende de la causa.  [place].  No participe en deportes de contacto ni otras actividades peligrosas. Pregntele al mdico qu actividades son seguras para usted. Esta informacin no tiene Marine scientist el consejo del mdico. Asegrese de hacerle al mdico cualquier pregunta que tenga. Document Released: 01/24/2016 Document Revised: 04/12/2018 Document Reviewed: 04/12/2018 Elsevier Patient Education  2020 [employer].

## 2019-02-08 ENCOUNTER — Encounter: Payer: Self-pay | Admitting: Hematology & Oncology

## 2019-02-16 ENCOUNTER — Encounter (HOSPITAL_COMMUNITY): Payer: Self-pay

## 2019-02-16 ENCOUNTER — Encounter (HOSPITAL_COMMUNITY): Payer: Self-pay | Admitting: *Deleted

## 2019-04-06 ENCOUNTER — Ambulatory Visit: Payer: Self-pay | Admitting: Family Medicine

## 2019-05-13 ENCOUNTER — Telehealth: Payer: Self-pay | Admitting: Hematology and Oncology

## 2019-05-13 ENCOUNTER — Other Ambulatory Visit: Payer: Self-pay | Admitting: *Deleted

## 2019-05-13 DIAGNOSIS — D61818 Other pancytopenia: Secondary | ICD-10-CM

## 2019-05-13 NOTE — Telephone Encounter (Signed)
Per Dr. Lorenso Courier, 12/14 1pm appt has been rescheduled to 9am w/labs at 8am on 12/14.

## 2019-05-13 NOTE — Telephone Encounter (Signed)
Received a call from Dr. Joan Mayans from [facility] to schedule pt to establish care with a new oncologist. Mr. Victor Porter has been scheduled to see Dr. Lorenso Courier on 12/14 at 1pm. I provided Dr. Joan Mayans with Dr. Libby Maw phone number as well as the desk nurse so that she further discuss about Mr. Victor Porter.

## 2019-05-17 ENCOUNTER — Telehealth: Payer: Self-pay | Admitting: Adult Health

## 2019-05-17 NOTE — Telephone Encounter (Signed)
error 

## 2019-05-23 ENCOUNTER — Ambulatory Visit: Payer: Self-pay | Admitting: Hematology and Oncology

## 2019-05-23 ENCOUNTER — Telehealth: Payer: Self-pay | Admitting: *Deleted

## 2019-05-23 ENCOUNTER — Inpatient Hospital Stay: Payer: Self-pay

## 2019-05-23 ENCOUNTER — Telehealth: Payer: Self-pay | Admitting: Hematology and Oncology

## 2019-05-23 ENCOUNTER — Inpatient Hospital Stay: Payer: Self-pay | Admitting: Hematology and Oncology

## 2019-05-23 MED ORDER — NASAL SPRAY 0.05 % NA SOLN
2.00 | NASAL | Status: DC
Start: ? — End: 2019-05-23

## 2019-05-23 MED ORDER — POTASSIUM CHLORIDE CRYS ER 20 MEQ PO TBCR
20.00 | EXTENDED_RELEASE_TABLET | ORAL | Status: DC
Start: 2019-05-23 — End: 2019-05-23

## 2019-05-23 MED ORDER — SIMETHICONE 80 MG PO CHEW
80.00 | CHEWABLE_TABLET | ORAL | Status: DC
Start: 2019-05-23 — End: 2019-05-23

## 2019-05-23 MED ORDER — PROCHLORPERAZINE EDISYLATE 10 MG/2ML IJ SOLN
10.00 | INTRAMUSCULAR | Status: DC
Start: ? — End: 2019-05-23

## 2019-05-23 MED ORDER — POLYETHYLENE GLYCOL 3350 17 GM/SCOOP PO POWD
17.00 | ORAL | Status: DC
Start: ? — End: 2019-05-23

## 2019-05-23 MED ORDER — ELTROMBOPAG OLAMINE 75 MG PO TABS
150.00 | ORAL_TABLET | ORAL | Status: DC
Start: 2019-05-24 — End: 2019-05-23

## 2019-05-23 MED ORDER — FP GLUCOSE PO
1.00 | ORAL | Status: DC
Start: ? — End: 2019-05-23

## 2019-05-23 MED ORDER — PANTOPRAZOLE SODIUM 40 MG PO TBEC
40.00 | DELAYED_RELEASE_TABLET | ORAL | Status: DC
Start: 2019-05-24 — End: 2019-05-23

## 2019-05-23 MED ORDER — TRIAMINIC COUGH/SORE THROAT PO
15.00 | ORAL | Status: DC
Start: 2019-05-23 — End: 2019-05-23

## 2019-05-23 MED ORDER — SODIUM CHLORIDE 0.9 % IV SOLN
250.00 | INTRAVENOUS | Status: DC
Start: ? — End: 2019-05-23

## 2019-05-23 MED ORDER — SALINE NASAL SPRAY 0.65 % NA SOLN
1.00 | NASAL | Status: DC
Start: ? — End: 2019-05-23

## 2019-05-23 MED ORDER — Medication
Status: DC
Start: ? — End: 2019-05-23

## 2019-05-23 MED ORDER — SODIUM CHLORIDE FLUSH 0.9 % IV SOLN
20.00 | INTRAVENOUS | Status: DC
Start: ? — End: 2019-05-23

## 2019-05-23 MED ORDER — GENERIC EXTERNAL MEDICATION
100.00 | Status: DC
Start: 2019-05-23 — End: 2019-05-23

## 2019-05-23 MED ORDER — GENERIC EXTERNAL MEDICATION
Status: DC
Start: ? — End: 2019-05-23

## 2019-05-23 MED ORDER — ONDANSETRON HCL 8 MG PO TABS
8.00 | ORAL_TABLET | ORAL | Status: DC
Start: ? — End: 2019-05-23

## 2019-05-23 MED ORDER — INFLUENZA VAC SPLIT QUAD 0.5 ML IM SUSY
0.50 | PREFILLED_SYRINGE | INTRAMUSCULAR | Status: DC
Start: ? — End: 2019-05-23

## 2019-05-23 MED ORDER — BENZONATATE 100 MG PO CAPS
100.00 | ORAL_CAPSULE | ORAL | Status: DC
Start: 2019-05-23 — End: 2019-05-23

## 2019-05-23 MED ORDER — GENERIC EXTERNAL MEDICATION
30.00 | Status: DC
Start: 2019-05-23 — End: 2019-05-23

## 2019-05-23 MED ORDER — VORICONAZOLE 200 MG PO TABS
200.00 | ORAL_TABLET | ORAL | Status: DC
Start: 2019-05-23 — End: 2019-05-23

## 2019-05-23 MED ORDER — BARIJODEEL PO
20.00 | ORAL | Status: DC
Start: 2019-05-24 — End: 2019-05-23

## 2019-05-23 MED ORDER — DEXTROSE 10 % IV SOLN
125.00 | INTRAVENOUS | Status: DC
Start: ? — End: 2019-05-23

## 2019-05-23 MED ORDER — SODIUM CHLORIDE FLUSH 0.9 % IV SOLN
20.00 | INTRAVENOUS | Status: DC
Start: 2019-05-24 — End: 2019-05-23

## 2019-05-23 MED ORDER — BARO-CAT PO
ORAL | Status: DC
Start: ? — End: 2019-05-23

## 2019-05-23 MED ORDER — DAPSONE 100 MG PO TABS
100.00 | ORAL_TABLET | ORAL | Status: DC
Start: 2019-05-24 — End: 2019-05-23

## 2019-05-23 MED ORDER — MOXIFLOXACIN HCL 400 MG PO TABS
400.00 | ORAL_TABLET | ORAL | Status: DC
Start: 2019-05-23 — End: 2019-05-23

## 2019-05-23 MED ORDER — AMLODIPINE BESYLATE 5 MG PO TABS
5.00 | ORAL_TABLET | ORAL | Status: DC
Start: 2019-05-24 — End: 2019-05-23

## 2019-05-23 MED ORDER — ACYCLOVIR 400 MG PO TABS
400.00 | ORAL_TABLET | ORAL | Status: DC
Start: 2019-05-23 — End: 2019-05-23

## 2019-05-23 NOTE — Telephone Encounter (Signed)
I cld [facility] Hem/Onc and spoke to Victor Porter to reschedule Victor Porter's new patient appt to 12/18 at 8am with labs at 730am and infusion after appt w/Victor Porter.I asked that she relay this to Victor Porter, whom I spoke to earlier this morning, so that she can relay the apt info to the pt. Per [place], she has sent the msg to Victor Porter.

## 2019-05-23 NOTE — Telephone Encounter (Signed)
Received in basket message from Seth Bake, scheduler for new pts, that pt is being discharged from [facility] today and his appt needs to be rescheduled to later in the week.  Pt will need early morning appts as he will likely need transfusions 2-3 x a week. He is being re-scheduled to 05/25/19. Today's appts have been cancelled. Scheduling aware of pt situation  Dr. Lorenso Courier is aware.

## 2019-05-23 NOTE — Progress Notes (Deleted)
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 No significant dysplasia noted.  3) 12/09/2018: Bmbx repeated. Gen[facility] myeloid panel showed no mutations. 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 Diagnosed with Aplastic anemia. 4) 01/10/2019-01/16/2019: admitted to [facility] for h-ATG x 4 days with prednisone, cyclosporine, and promacta.  5) 02/10/2019: admitted to [facility] for R sided lower tooth and jaw pain. No jaw infection noted during dental consult. 6) 02/25/19-03/07/2019: admitted to [facility] with neutropenic fever and was found to have CMV viremia, adenovirus on RVP, sinusitis, and concern for invasive fungal PNA.  7) 12/4-12/01/2019: received salvage rabbit ATG.  8) 05/23/2019: Establish care with Dr. Lorenso Courier for co-managed local care.   CHIEF COMPLAINTS/PURPOSE OF CONSULTATION:  Transfer from [facility] for supportive care of Aplastic Anemia   HISTORY OF PRESENTING ILLNESS:  Victor Porter 58 y.o. male with medical history significant for aplastic anemia who presents to establish care with the [facility] for supportive care. Victor Porter was previously followed at [facility] by Dr. Dario Ave and received 3 x weekly blood products. Due to transportation and social issues the patient is now being cared for by his brother, who lives in [place], [place]. As such it was recommended for ease of transport that he establish care here for assistance in supportive measures.   On review of prior records, Victor Porter was ***  On exam today ***  MEDICAL HISTORY:  No past medical history on file.  SURGICAL HISTORY: No past surgical history on  file.  SOCIAL HISTORY: Social History   Socioeconomic History  . Marital status: Married    Spouse name: Not on file  . Number of children: Not on file  . Years of education: Not on file  . Highest education level: Not on file  Occupational History  . Not on file  Tobacco Use  . Smoking status: Never Smoker  . Smokeless tobacco: Never Used  Substance and Sexual Activity  . Alcohol use: Never  . Drug use: Never  . Sexual activity: Not on file  Other Topics Concern  . Not on file  Social History Narrative  . Not on file   Social Determinants of Health   Financial Resource Strain:   . Difficulty of Paying Living Expenses: Not on file  Food Insecurity:   . Worried About Charity fundraiser in the Last Year: Not on file  . Ran Out of Food in the Last Year: Not on file  Transportation Needs:   . Lack of Transportation (Medical): Not on file  . Lack of Transportation (Non-Medical): Not on file  Physical Activity:   . Days of Exercise per Week: Not on file  . Minutes of Exercise per Session: Not on file  Stress:   . Feeling of Stress : Not on file  Social Connections:   . Frequency of Communication with Friends and Family: Not on file  . Frequency of Social Gatherings with Friends and Family: Not on file  . Attends Religious Services: Not on file  . Active Member of Clubs or Organizations: Not on file  . Attends Archivist Meetings: Not on file  . Marital Status: Not on file  Intimate Partner Violence:   . Fear of  Current or Ex-Partner: Not on file  . Emotionally Abused: Not on file  . Physically Abused: Not on file  . Sexually Abused: Not on file    FAMILY HISTORY: Family History  Problem Relation Age of Onset  . Stomach cancer Father     ALLERGIES:  has No Known Allergies.  MEDICATIONS:  No current outpatient medications on file.   No current facility-administered medications for this visit.    REVIEW OF SYSTEMS:   Constitutional: ( - ) fevers,  ( - )  chills , ( - ) night sweats Eyes: ( - ) blurriness of vision, ( - ) double vision, ( - ) watery eyes Ears, nose, mouth, throat, and face: ( - ) mucositis, ( - ) sore throat Respiratory: ( - ) cough, ( - ) dyspnea, ( - ) wheezes Cardiovascular: ( - ) palpitation, ( - ) chest discomfort, ( - ) lower extremity swelling Gastrointestinal:  ( - ) nausea, ( - ) heartburn, ( - ) change in bowel habits Skin: ( - ) abnormal skin rashes Lymphatics: ( - ) new lymphadenopathy, ( - ) easy bruising Neurological: ( - ) numbness, ( - ) tingling, ( - ) new weaknesses Behavioral/Psych: ( - ) mood change, ( - ) new changes  All other systems were reviewed with the patient and are negative.  PHYSICAL EXAMINATION: ECOG PERFORMANCE STATUS: {CHL ONC ECOG PS:[id number]}  There were no vitals filed for this visit. There were no vitals filed for this visit.  GENERAL: well appearing *** in NAD  SKIN: skin color, texture, turgor are normal, no rashes or significant lesions EYES: conjunctiva are pink and non-injected, sclera clear OROPHARYNX: no exudate, no erythema; lips, buccal mucosa, and tongue normal  NECK: supple, non-tender LYMPH:  no palpable lymphadenopathy in the cervical, axillary or inguinal LUNGS: clear to auscultation and percussion with normal breathing effort HEART: regular rate & rhythm and no murmurs and no lower extremity edema ABDOMEN: soft, non-tender, non-distended, normal bowel sounds Musculoskeletal: no cyanosis of digits and no clubbing  PSYCH: alert & oriented x 3, fluent speech NEURO: no focal motor/sensory deficits  LABORATORY DATA:  I have reviewed the data as listed Lab Results  Component Value Date   WBC 4.6 12/05/2018   HGB 10.0 (L) 12/05/2018   HCT 29.2 (L) 12/05/2018   MCV 93.9 12/05/2018   PLT 17 (LL) 12/05/2018   NEUTROABS 2.3 12/05/2018    PATHOLOGY: ***  BLOOD FILM: *** Review of the peripheral blood smear showed normal appearing white cells with  neutrophils that were appropriately lobated and granulated. There was no predominance of bi-lobed or hyper-segmented neutrophils appreciated. No Dohle bodies were noted. There was no left shifting, immature forms or blasts noted. Lymphocytes remain normal in size without any predominance of large granular lymphocytes. Red cells show no anisopoikilocytosis, macrocytes , microcytes or polychromasia. There were no schistocytes, target cells, echinocytes, acanthocytes, dacrocytes, or stomatocytes.There was no rouleaux formation, nucleated red cells, or intra-cellular inclusions noted. The platelets are normal in size, shape, and color without any clumping evident.  RADIOGRAPHIC STUDIES: I have personally reviewed the radiological images as listed and agreed with the findings in the report. No results found.  ASSESSMENT & PLAN Ellyn HackHector Vigoya [name] 58 y.o. male with medical history significant for aplastic anemia who [facility].  ***  #Aplastic Anemia, s/p rATG salvage therapy  -- after discussion with Dr. Lowella BandyBhave, we have agreed to assist with local management  of Mr. [name]'s aplastic anemia. We can provide local supportive care with transfusions and urgent needs, however if he were to require further inpatient treatment of acquire an infection we would recommend transfer to [facility] where the specialists and services are familiar with him. --recommend periodic visits with Dr. Lowella Bandy to help guide treatment management, with local support here at the [facility]. --agree with frequent CBC checks for transfusion support. Will set a goal of Hgb >7.0 and Plt >10. We will plan for him to be evaluated 3 times weekly initially and can change this based on his needs. --  No orders of the defined types were placed in this encounter.   All questions were answered. The patient knows to call the clinic with any problems, questions or concerns.  A total of more than {CHL  ONC TIME VISIT - TRRNH:[id number]} were spent on this encounter and over half of that time was spent on counseling and coordination of care as outlined above.   Ulysees Barns, MD Department of Hematology/Oncology [facility] Phone: [phone] Pager: [phone] Email: [email or web address]  05/23/2019 8:20 AM

## 2019-05-25 ENCOUNTER — Ambulatory Visit: Payer: Self-pay | Admitting: Hematology and Oncology

## 2019-05-25 ENCOUNTER — Other Ambulatory Visit: Payer: Self-pay

## 2019-05-27 ENCOUNTER — Inpatient Hospital Stay: Payer: Self-pay

## 2019-05-27 ENCOUNTER — Inpatient Hospital Stay (HOSPITAL_BASED_OUTPATIENT_CLINIC_OR_DEPARTMENT_OTHER): Payer: Self-pay | Admitting: Hematology and Oncology

## 2019-05-27 ENCOUNTER — Inpatient Hospital Stay: Payer: Self-pay | Attending: Hematology and Oncology

## 2019-05-27 ENCOUNTER — Other Ambulatory Visit: Payer: Self-pay | Admitting: *Deleted

## 2019-05-27 ENCOUNTER — Other Ambulatory Visit: Payer: Self-pay

## 2019-05-27 VITALS — BP 166/97 | HR 97 | Temp 98.6°F | Resp 16 | Ht 70.0 in | Wt 156.1 lb

## 2019-05-27 DIAGNOSIS — I517 Cardiomegaly: Secondary | ICD-10-CM | POA: Insufficient documentation

## 2019-05-27 DIAGNOSIS — R918 Other nonspecific abnormal finding of lung field: Secondary | ICD-10-CM | POA: Insufficient documentation

## 2019-05-27 DIAGNOSIS — J811 Chronic pulmonary edema: Secondary | ICD-10-CM

## 2019-05-27 DIAGNOSIS — Z452 Encounter for adjustment and management of vascular access device: Secondary | ICD-10-CM

## 2019-05-27 DIAGNOSIS — D61818 Other pancytopenia: Secondary | ICD-10-CM

## 2019-05-27 DIAGNOSIS — D619 Aplastic anemia, unspecified: Secondary | ICD-10-CM

## 2019-05-27 DIAGNOSIS — R5383 Other fatigue: Secondary | ICD-10-CM

## 2019-05-27 DIAGNOSIS — Z800 Family history of malignant neoplasm of digestive organs: Secondary | ICD-10-CM

## 2019-05-27 DIAGNOSIS — J90 Pleural effusion, not elsewhere classified: Secondary | ICD-10-CM

## 2019-05-27 DIAGNOSIS — J9811 Atelectasis: Secondary | ICD-10-CM | POA: Insufficient documentation

## 2019-05-27 DIAGNOSIS — R7881 Bacteremia: Secondary | ICD-10-CM

## 2019-05-27 DIAGNOSIS — Z79899 Other long term (current) drug therapy: Secondary | ICD-10-CM

## 2019-05-27 DIAGNOSIS — R350 Frequency of micturition: Secondary | ICD-10-CM

## 2019-05-27 LAB — CBC WITH DIFFERENTIAL (CANCER CENTER ONLY)
HCT: 23.3 % — ABNORMAL LOW (ref 39.0–52.0)
Hemoglobin: 8.1 g/dL — ABNORMAL LOW (ref 13.0–17.0)
MCH: 29.9 pg (ref 26.0–34.0)
MCHC: 34.8 g/dL (ref 30.0–36.0)
MCV: 86 fL (ref 80.0–100.0)
Platelet Count: <5 10*3/uL — CL (ref 150–400)
RBC: 2.71 MIL/uL — ABNORMAL LOW (ref 4.22–5.81)
RDW: 14 % (ref 11.5–15.5)
WBC Count: 0.3 10*3/uL — CL (ref 4.0–10.5)
nRBC: 0 % (ref 0.0–0.2)

## 2019-05-27 LAB — CMP (CANCER CENTER ONLY)
ALT: 81 U/L — ABNORMAL HIGH (ref 0–44)
AST: 15 U/L (ref 15–41)
Albumin: 2.8 g/dL — ABNORMAL LOW (ref 3.5–5.0)
Alkaline Phosphatase: 129 U/L — ABNORMAL HIGH (ref 38–126)
Anion gap: 12 (ref 5–15)
BUN: 50 mg/dL — ABNORMAL HIGH (ref 6–20)
CO2: 21 mmol/L — ABNORMAL LOW (ref 22–32)
Calcium: 8.2 mg/dL — ABNORMAL LOW (ref 8.9–10.3)
Chloride: 103 mmol/L (ref 98–111)
Creatinine: 1.41 mg/dL — ABNORMAL HIGH (ref 0.61–1.24)
GFR, Est AFR Am: >60 mL/min (ref >60)
GFR, Estimated: 55 mL/min — ABNORMAL LOW (ref >60)
Glucose, Bld: 96 mg/dL (ref 70–99)
Potassium: 3.9 mmol/L (ref 3.5–5.1)
Sodium: 136 mmol/L (ref 135–145)
Total Bilirubin: 1 mg/dL (ref 0.3–1.2)
Total Protein: 6.4 g/dL — ABNORMAL LOW (ref 6.5–8.1)

## 2019-05-27 LAB — SAMPLE TO BLOOD BANK

## 2019-05-27 LAB — SAVE SMEAR(SSMR), FOR PROVIDER SLIDE REVIEW

## 2019-05-27 MED ORDER — HEPARIN SOD (PORK) LOCK FLUSH 100 UNIT/ML IV SOLN
250.0000 [IU] | INTRAVENOUS | Status: AC | PRN
  Administered 2019-05-27: 250 [IU]
  Filled 2019-05-27: qty 5

## 2019-05-27 MED ORDER — SODIUM CHLORIDE 0.9% FLUSH
10.0000 mL | INTRAVENOUS | Status: DC | PRN
  Administered 2019-05-27: 09:00:00 10 mL via INTRAVENOUS
  Filled 2019-05-27: qty 10

## 2019-05-27 MED ORDER — SODIUM CHLORIDE 0.9% IV SOLUTION
250.0000 mL | Freq: Once | INTRAVENOUS | Status: AC
  Administered 2019-05-27: 250 mL via INTRAVENOUS
  Filled 2019-05-27: qty 250

## 2019-05-27 MED ORDER — HEPARIN SOD (PORK) LOCK FLUSH 100 UNIT/ML IV SOLN
500.0000 [IU] | Freq: Once | INTRAVENOUS | Status: AC
  Administered 2019-05-27: 250 [IU] via INTRAVENOUS
  Filled 2019-05-27: qty 5

## 2019-05-27 MED ORDER — SODIUM CHLORIDE 0.9% FLUSH
3.0000 mL | INTRAVENOUS | Status: AC | PRN
  Administered 2019-05-27: 10 mL
  Filled 2019-05-27: qty 10

## 2019-05-27 MED ORDER — SODIUM CHLORIDE 0.9 % IV SOLN
Freq: Once | INTRAVENOUS | Status: AC
  Filled 2019-05-27: qty 250

## 2019-05-27 NOTE — Patient Instructions (Signed)
Gua de cuidados en el hogar de catter central de insercin perifrica (CCIP) PICC Home Care Guide  Un catter central de insercin perifrica (CCIP) es una forma de Teacher, adult education intravenoso (I.V.) que permite que los medicamentos y los lquidos intravenosos (I.V.) se distribuyan rpidamente en el organismo. Un CCIP es un tubo [place], delgado y flexible (catter) que se inserta en una vena de la parte superior del brazo. El catter finaliza en una vena grande en el pecho (vena cava superior o VCS). Despus de que se inserta el catter, se puede tomar una radiografa de trax para asegurarse de que est en el lugar correcto. Puede ser necesario colocar el catter por diferentes motivos, por ejemplo:  Para suministrar medicamentos y nutricin lquida.  Para administrar lquidos intravenosos (I.V.) y productos de Risk manager.  Si hay dificultad para colocar un catter intravenoso perifrico. Si se cuida adecuadamente, el catter [employer] en su lugar durante varios meses. Un CCIP tambin permite que la persona abandone el hospital y regrese a su casa ms rpidamente. Un familiar, mdico o un equipo de atencin mdica a domicilio pueden encargarse de los medicamentos y del cuidado del Brewing technologist. Cules son los riesgos? Generalmente, tener un CCIP es seguro. Sin embargo, pueden ocurrir Advice worker, por ejemplo:  Formacin de un cogulo sanguneo (trombo) en el CCIP o en la punta de [place].  Formacin de un cogulo sanguneo en una vena (trombosis venosa profunda) o un cogulo que se desplaza hasta el pulmn (embolia pulmonar).  Inflamacin de la vena (flebitis) en la que se coloc el CCIP.  Infeccin. La infeccin del torrente sanguneo asociada con la va central (CLABSI) es una infeccin grave que, por lo general, requiere de hospitalizacin.  Movimiento del CCIP (desplazamiento). La punta del CCIP se puede mover de su posicin original debido a una actividad fsica excesiva, tos forzada,  estornudos o vmitos.  Un corte o rotura del catter. Es importante no utilizar tijeras cerca del CCIP.  Irritacin o lesin del nervio o tendn durante la insercin del CCIP. Cmo cuidar Scientist, research (life sciences) problemas  Usted y los mdicos deben lavarse las manos con jabn, con frecuencia. Lvese las manos: ? Antes de tocar la lnea del CCIP o el dispositivo de infusin. ? Antes de cambiar una venda (vendaje).  Irrigue el CCIP en la forma en que le indic el profesional. Informe de inmediato al mdico si le resulta difcil purgar el CCIP o no puede hacerlo. No use la fuerza para irrigarlo.  No use una jeringa de menos de 37ml para irrigarlo.  Evite los controles de la presin arterial en el brazo en el que se Garment/textile technologist.  Nunca tire del catter.  No se quite el catter usted mismo. Solo un profesional entrenado puede [place].  Use nicamente suministros limpios y estriles. Mantenga los suministros en un lugar seco. No vuelva a usar agujas, jeringas o dems suministros. Si lo hace, puede provocar una infeccin.  Mantenga a las Neurosurgeon y a los nios alejados del CCIP.  Controle Immunologist de la insercin del CCIP todos los das para detectar si hay signos de infeccin. Est atento a los siguientes signos: ? Prdidas. ? Dolor, hinchazn o enrojecimiento. ? Lquido o sangre. ? Calor. ? Pus o mal olor. Cuidado del vendaje del CCIP  Mantenga el apsito del catter (vendaje) limpio y seco para evitar las infecciones.  No tome baos de inmersin, no nade ni use el jacuzzi hasta que el mdico lo autorice. Pregntele al  mdico si puede ducharse. Delle Reining solo le permitan tomar baos de [place]. Cuando se le permita baarse: ? Pdale al mdico que le ensee cmo vendar el CCIP. ? Cubra el CCIP con un envoltorio de plstico limpio y cinta adhesiva para que no se moje mientras se ducha.  Siga las indicaciones del mdico acerca de los cuidados del lugar de la insercin y el vendaje. Haga lo  siguiente: ? Lvese las manos con agua y jabn antes de Multimedia programmer las vendas (vendajes). Use desinfectante para manos si no dispone de [place] y [place]. ? Cambie el vendaje como se lo haya indicado el mdico. ? No retire los puntos (suturas), la goma para cerrar la piel o las tiras [place]. Es posible que estos cierres cutneos Conservation officer, nature en la piel durante 2semanas o ms tiempo. Si los bordes de las tiras [address] empiezan a despegarse y Scientific laboratory technician, puede recortar los que estn sueltos. No retire las tiras [employer] por completo a menos que el mdico se lo indique.  Cmbielo si est flojo o mojado. Instrucciones generales   Lleve consigo una tarjeta de identificacin de CCIP o use un brazalete de alerta mdico en todo momento.  Mantenga el tubo sujetado en todo momento, excepto cuando se use.  Lleve consigo una abrazadera con bordes suaves para colocar en el tubo en caso de que se rompa.  No use tijeras u objetos punzantes cerca del tubo.  Puede inclinar el brazo y [employer]. Si el catter est cerca o en el lugar en que dobla el codo, evite la actividad en la que deba mover repetidas veces el codo.  Evite levantar objetos pesados como se lo haya indicado el mdico.  Concurra a todas las visitas de control como se lo haya indicado el mdico. Esto es importante. Eliminacin de suministros  Deseche las agujas y las jeringas en un contenedor para desechos destinado a objetos punzantes(recipiente para objetos punzantes). Puede comprar un recipiente para objetos punzantes en [place], o puede hacer uno usted mismo con una botella de plstico rgida y Theme park manager.  Coloque vendas usadas o bolsas de infusin en una bolsa plstica. Tire esa bolsa en el contenedor de basura. Comunquese con un mdico si:  Siente dolor en el brazo, el odo, la cara o los dientes.  Tiene fiebre o siente escalofros.  Enrojecimiento, hinchazn o dolor alrededor del lugar de la  insercin.  Tiene lquido o [employer] del Environmental consultant de la insercin.  El lugar de la insercin est caliente al tacto.  Tiene pus o percibe mal olor que proviene del lugar de la insercin.  La piel se siente dura y est elevada alrededor del lugar de la insercin. Solicite ayuda de inmediato si:  El CCIP se Civil Service fast streamer. Si esto ocurre, cubra el lugar de la insercin con un vendaje o una gasa. No deseche el CCIP. El Chief Executive Officer.  El catter fue tironeado y se ha salido parcialmente. No trate de volver a Ecologist.  Usted no puede purgarlo, le resulta difcil hacerlo o pierde lquido alrededor del Environmental consultant de insercin cuando lo hace.  Escucha un ruido al purgarlo.  Siente que el corazn late rpidamente o faltan latidos.  El CCIP tiene un orificio o una rotura.  Tiene hinchado el brazo [employer] [place] [employer] se insert el catter.  Tiene una mancha roja subindole por el brazo desde el lugar de insercin del CCIP. Resumen  Un Catter central de insercin perifrica (CCIP) es un tubo [place], delgado  y flexible (catter) que se inserta en una vena de la parte superior del brazo.  El catter lo inserta un enfermero o un mdico, utilizando una tcnica estril. Solo un profesional entrenado puede retirarlo.  Lleve la identificacin siempre con usted.  Evite los controles de la presin arterial en el brazo en el que se Location manager.  Si se cuida adecuadamente, el catter [employer] en su lugar durante varios meses. Un CCIP tambin permite que la persona abandone el hospital y regrese a su casa ms rpidamente. Esta informacin no tiene Marine scientist el consejo del mdico. Asegrese de hacerle al mdico cualquier pregunta que tenga. Document Released: 03/04/2008 Document Revised: 11/05/2016 Document Reviewed: 11/05/2016 Elsevier Patient Education  2020 [employer].

## 2019-05-27 NOTE — Patient Instructions (Signed)
Transfusin de plaquetas Platelet Transfusion La transfusin de plaquetas es un procedimiento en el cual se reciben plaquetas de un donante a travs de una va intravenosa. Las plaquetas son fragmentos diminutos de clulas sanguneas. Cuando una persona sufre una lesin, las plaquetas se agrupan en la zona lesionada para formar un cogulo de [place]. Esto ayuda a Sales promotion account executive y es el principio del proceso de Development worker, community. Si tiene [employer], es posible que su sangre tenga problemas para Designer, fashion/clothing. Esto puede causar sangrado y moretones con mucha facilidad. Tal vez deba recibir una transfusin de plaquetas si tiene una enfermedad que reduce el nmero de plaquetas (trombocitopenia). Se puede usar una transfusin de plaquetas para detener o evitar el sangrado excesivo. Informe al mdico acerca de lo siguiente:  Las reacciones que haya tenido durante transfusiones previas.  Cualquier alergia que tenga.  Todos los [employer], incluidos vitaminas, hierbas, gotas oftlmicas, cremas y medicamentos de venta libre.  Cualquier enfermedad de la sangre que tenga.  Cirugas previas a las que se someti.  Cualquier afeccin mdica que tenga.  Si est embarazada o podra estarlo. Cules son los riesgos? En general, se trata de un procedimiento seguro. Sin embargo, pueden ocurrir complicaciones, por ejemplo:  Cristy Hilts.  Infeccin.  Reaccin alrgica a las plaquetas del donante.  Que el sistema de su cuerpo que combate las enfermedades (sistema inmunitario) ataque las plaquetas del donante (reaccin hemoltica). Esto es poco frecuente.  Una reaccin poco frecuente que causa dao pulmonar (lesin pulmonar aguda producida por transfusin). Qu ocurre antes del procedimiento? Medicamentos  Consulte al mdico sobre: ? Quarry manager o suspender los medicamentos que toma habitualmente. Esto es muy importante si toma medicamentos para la diabetes o anticoagulantes. ? Tomar  medicamentos como aspirina e ibuprofeno. Estos medicamentos pueden tener un efecto anticoagulante en la [place]. No tome estos medicamentos a menos que el mdico se lo indique. ? Tomar medicamentos de [employer], vitaminas, hierbas y suplementos. Instrucciones generales  Se le realizar un anlisis de sangre para determinar su grupo sanguneo. Su tipo de sangre determina qu tipo de plaquetas recibir.  Siga las indicaciones del mdico respecto de las restricciones para las comidas o las bebidas.  Si tuvo una reaccin alrgica a una transfusin en el pasado, tal vez le administren un medicamento que ayude a evitarla.  Le controlarn la temperatura, la presin arterial, el pulso y la respiracin. Qu ocurre durante el procedimiento?   Le colocarn una va intravenosa en una de las venas.  Por su seguridad, dos mdicos verificarn su identidad y las plaquetas del donante que se le van a infundir.  Una bolsa con las plaquetas del donante se conectar a su va intravenosa. Las plaquetas ingresarn en su torrente sanguneo. Esto generalmente demora entre 30 y 53 minutos.  Durante la transfusin, le controlarn la temperatura, la presin arterial, el pulso y la respiracin. Esto ayuda a detectar signos tempranos de una reaccin.  Tambin lo controlarn para Hydrographic surveyor otros sntomas que puedan indicar una reaccin, como escalofros, urticaria o picazn.  Si tiene signos de Freight forwarder, se suspender la transfusin y tal vez le administren un medicamento para Producer, television/film/video.  Una vez finalizada la transfusin, se retirar la va intravenosa.  Se puede aplicar presin en el lugar de la va intravenosa (i.v.) para detener cualquier sangrado.  El lugar de la va intravenosa (i.v.) se cubrir con un vendaje. Este procedimiento puede variar segn el mdico y el hospital. Sander Nephew sucede despus del procedimiento?  Le controlarn la presin arterial, la temperatura, el pulso  y la respiracin hasta que abandone el hospital o la clnica.  Es posible que tenga algunos moretones y Stage manager en el lugar de la va intravenosa (i.v.). Siga estas indicaciones en su casa: Medicamentos  Tome los medicamentos de venta libre y los recetados solamente como se lo haya indicado el mdico.  Hable con el mdico antes de tomar cualquier medicamento que contenga aspirina o antiinflamatorios no esteroideos (AINE). Estos medicamentos aumentan el riesgo de hemorragias peligrosas. Instrucciones generales  Cambie o retire Office manager se lo haya indicado el mdico.  Retome sus actividades normales como se lo haya indicado el mdico. Pregntele al mdico qu actividades son seguras para usted.  No tome baos de inmersin, no nade ni use el jacuzzi hasta que el mdico lo autorice. Pregntele al mdico si puede ducharse.  Controle el lugar de la va intravenosa (i.v.) [employer] para descartar signos de infeccin. Est atento a los siguientes signos: ? Dolor, hinchazn o enrojecimiento. ? Lquido o sangre. Si sale lquido o sangre del Environmental consultant de la va intravenosa (i.v.), ejerza presin firme con las manos sobre el vendaje que cubre la zona durante uno o dos minutos. Al hacerlo, debera detenerse el sangrado. ? Calor. ? Pus o mal olor.  Concurra a todas las visitas de control como se lo haya indicado el mdico. Esto es importante. Comunquese con un mdico si tiene:  Dolor de cabeza que no se alivia con medicamentos.  Ronchas, erupcin o picazn en la piel.  Nuseas o vmitos.  Cansancio o debilidad inusuales.  Signos de infeccin en el lugar de la va intravenosa (i.v.). Solicite ayuda de inmediato si:  Tiene fiebre o siente escalofros.  Orina con menos frecuencia que lo habitual.  La orina es de un color ms oscuro que lo normal.  Tiene alguno de los siguientes sntomas: ? Dificultad para Industrial/product designer. ? Dolor en la espalda, el abdomen o el pecho. ? Piel fra y  hmeda. ? Latidos cardacos acelerados. Resumen  Las plaquetas son fragmentos diminutos de clulas sanguneas que se agrupan para formar un cogulo de sangre cuando se produce una herida. Si tiene [employer], es posible que su sangre tenga problemas para Biochemist, clinical.  La transfusin de plaquetas es un procedimiento en el cual se reciben plaquetas de un donante a travs de una va intravenosa.  Se puede usar una transfusin de plaquetas para detener o evitar el sangrado excesivo.  Despus del procedimiento, revise el lugar de la va intravenosa (i.v.) todos los das para detectar signos de infeccin, como enrojecimiento, hinchazn, dolor o calor. Esta informacin no tiene Theme park manager el consejo del mdico. Asegrese de hacerle al mdico cualquier pregunta que tenga. Document Released: 09/11/2008 Document Revised: 08/03/2017 Document Reviewed: 08/03/2017 Elsevier Patient Education  2020 [employer].

## 2019-05-27 NOTE — Progress Notes (Signed)
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 No significant dysplasia noted.  3) 12/09/2018: Bmbx repeated. [facility] myeloid panel showed no mutations. 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 Diagnosed with Aplastic anemia. 4) 01/10/2019-01/16/2019: admitted to [facility] for h-ATG x 4 days with prednisone, cyclosporine, and promacta.  5) 02/10/2019: admitted to [facility] for R sided lower tooth and jaw pain. No jaw infection noted during dental consult. 6) 02/25/19-03/07/2019: admitted to [facility] with neutropenic fever and was found to have CMV viremia, adenovirus on RVP, sinusitis, and concern for invasive fungal PNA.  7) 12/4-12/01/2019: received salvage rabbit ATG.  8) 05/23/2019: Establish care with Dr. Lorenso Courier for co-managed local care.   CHIEF COMPLAINTS/PURPOSE OF CONSULTATION:  Transfer from [facility] for supportive care of Aplastic Anemia   HISTORY OF PRESENTING ILLNESS:  Victor Porter 58 y.o. male with medical history significant for aplastic anemia who presents to establish care with the [facility] for supportive care. Victor Porter was previously followed at [facility] by Dr. Dario Ave and received 3 x weekly blood products. Due to transportation and social issues the patient is now being cared for by his brother, who lives in [place], [place]. As such it was recommended for ease of transport that he establish care here for assistance in supportive measures.   On review of prior records, Victor Porter was initially diagnosed in June 2020.  He had initially presented to [facility], however he was subsequently seen at [facility] in their leukemia unit.  After undergoing a bone marrow biopsy in July his findings were considered consistent with aplastic anemia.  In August 2020 he underwent inpatient treatment with horse ATG x4 days with prednisone cyclosporine and Promacta.  Unfortunately this did not improve the patient's counts.  He prepresented in September with right sided lower tooth and jaw pain.  He was also admitted with neutropenic fever and found to have CMV viremia, adenovirus and sinusitis his CT scan findings were concerning for invasive fungal pneumonia.  In early December he underwent salvage therapy with rabbit ATG, and was referred to [facility] for local management of his cytopenias due to transportation issues and family support issues.  The patient had a sister who was looking after him at [facility], however she was no longer able to do so and therefore he is transferred to the care of his brother in [place] who lives much closer to [facility].  Dr. Joan Mayans called and contacted me directly to give a warm handoff of this patient.  At this time we are in agreement this is a comanaged local care situation.  On exam today Victor Porter is animated and grateful to be here.  We had a discussion about the history of his disease with the assistance of a hospital approved translator.  He noted that he was still having some bleeding from the lesion on his tongue and his cheek, but that this has been mild.  He notes that he has been sleeping better since his discharge from the hospital, however he does have frequent urination.  He currently denies any fevers, chills, sweats, nausea, vomiting, or diarrhea.  He denies having any shortness of breath, but  he is present in a wheelchair when examined today.  Full 10 point ROS is listed below.  On further discussion I addressed the difficult situation that he is found himself and in the complicated nature of his disease.  The patient  did become emotional when I discussed concern that his treatment options so far have been unsuccessful.  He noted that he believes his fate is in God's hands.  I informed him that we would do everything we could to support him while a viable treatment option was found.  He does report that numerous relatives of his are being tested for consideration of being a transplant donor.  MEDICAL HISTORY:  Past Medical History:  Diagnosis Date   Aplastic anemia ([place])     SURGICAL HISTORY: No past surgical history on file.  SOCIAL HISTORY: Social History   Socioeconomic History   Marital status: Married    Spouse name: Not on file   Number of children: Not on file   Years of education: Not on file   Highest education level: Not on file  Occupational History   Not on file  Tobacco Use   Smoking status: Never Smoker   Smokeless tobacco: Never Used  Substance and Sexual Activity   Alcohol use: Never   Drug use: Never   Sexual activity: Not on file  Other Topics Concern   Not on file  Social History Narrative   Not on file   Social Determinants of Health   Financial Resource Strain:    Difficulty of Paying Living Expenses: Not on file  Food Insecurity:    Worried About [place] in the Last Year: Not on file   Ran Out of Food in the Last Year: Not on file  Transportation Needs:    Lack of Transportation (Medical): Not on file   Lack of Transportation (Non-Medical): Not on file  Physical Activity:    Days of Exercise per Week: Not on file   Minutes of Exercise per Session: Not on file  Stress:    Feeling of Stress : Not on file  Social Connections:    Frequency of Communication with Friends and Family: Not on file   Frequency of Social Gatherings with Friends and Family: Not on file   Attends Religious Services: Not on file   Active Member of Clubs or Organizations: Not on file   Attends Archivist Meetings: Not on file   Marital  Status: Not on file  Intimate Partner Violence:    Fear of Current or Ex-Partner: Not on file   Emotionally Abused: Not on file   Physically Abused: Not on file   Sexually Abused: Not on file    FAMILY HISTORY: Family History  Problem Relation Age of Onset   Stomach cancer Father     ALLERGIES:  is allergic to grapefruit extract and tamarind extract.  MEDICATIONS:  Current Outpatient Medications  Medication Sig Dispense Refill   acyclovir (ZOVIRAX) 400 MG tablet Take 400 mg by mouth 2 (two) times daily.     albuterol (VENTOLIN HFA) 108 (90 Base) MCG/ACT inhaler Inhale 2 puffs into the lungs 2 (two) times daily as needed.     amLODipine (NORVASC) 5 MG tablet Take 5 mg by mouth every morning.     benzonatate (TESSALON) 100 MG capsule Take 100 mg by mouth 3 (three) times daily.     carboxymethylcellulose (REFRESH PLUS) 0.5 % SOLN Place 1 drop into both eyes 4 (four) times daily as needed.  cycloSPORINE modified (NEORAL) 50 MG capsule Take 2 capsules by mouth 2 (two) times daily.     dapsone 100 MG tablet Take 1 tablet by mouth daily.     eltrombopag (PROMACTA) 75 MG tablet Take 75 mg by mouth daily.     levofloxacin (LEVAQUIN) 500 MG tablet Take 1 tablet by mouth daily.     lidocaine (XYLOCAINE) 2 % solution Take 30 mLs by mouth every 6 (six) hours as needed.     Magnesium Chloride 64 MG TBEC Take 1 tablet by mouth 2 (two) times daily.     omeprazole (PRILOSEC) 40 MG capsule Take 1 capsule by mouth daily.     ondansetron (ZOFRAN) 8 MG tablet Take 1 tablet by mouth every 8 (eight) hours as needed for nausea.     predniSONE (DELTASONE) 10 MG tablet 6 tablets.     senna-docusate (SENOKOT-S) 8.6-50 MG tablet Take 2 tablets by mouth at bedtime.     voriconazole (VFEND) 50 MG tablet Take 4 tablets by mouth every 12 (twelve) hours.     No current facility-administered medications for this visit.    REVIEW OF SYSTEMS:   Constitutional: ( - ) fevers, ( - )  chills  , ( - ) night sweats Eyes: ( - ) blurriness of vision, ( - ) double vision, ( - ) watery eyes Ears, nose, mouth, throat, and face: ( - ) mucositis, ( - ) sore throat Respiratory: ( - ) cough, ( - ) dyspnea, ( - ) wheezes Cardiovascular: ( - ) palpitation, ( - ) chest discomfort, ( - ) lower extremity swelling Gastrointestinal:  ( - ) nausea, ( - ) heartburn, ( - ) change in bowel habits Skin: ( - ) abnormal skin rashes Lymphatics: ( - ) new lymphadenopathy, ( - ) easy bruising Neurological: ( - ) numbness, ( - ) tingling, ( - ) new weaknesses Behavioral/Psych: ( - ) mood change, ( - ) new changes  All other systems were reviewed with the patient and are negative.  PHYSICAL EXAMINATION: ECOG PERFORMANCE STATUS: 2 - Symptomatic, <50% confined to bed  Vitals:   05/27/19 0933 05/27/19 0934  BP: (!) 131/105 (!) 166/97  Pulse: 97   Resp: 16   Temp: 98.6 F (37 C)   SpO2: 97%    Filed Weights   05/27/19 0933  Weight: 156 lb 1.6 oz (70.8 kg)    GENERAL: chronically ill appearing middle aged Hispanic male in NAD  SKIN: skin color, texture, turgor are normal, no rashes or significant lesions EYES: conjunctiva are pink and non-injected, sclera clear OROPHARYNX: lesion on right check and left side of tongue. Not active bleeding. Dark maroon in color.  LUNGS: clear to auscultation and percussion with normal breathing effort HEART: regular rate & rhythm and no murmurs and no lower extremity edema ABDOMEN: soft, non-tender, non-distended, normal bowel sounds Musculoskeletal: no cyanosis of digits and no clubbing  PSYCH: alert & oriented x 3, fluent speech NEURO: no focal motor/sensory deficits  LABORATORY DATA:  I have reviewed the data as listed Lab Results  Component Value Date   WBC 0.3 (LL) 05/27/2019   HGB 8.1 (L) 05/27/2019   HCT 23.3 (L) 05/27/2019   MCV 86.0 05/27/2019   PLT <5 (LL) 05/27/2019   NEUTROABS 2.3 12/05/2018    PATHOLOGY: Bmbx performed at [facility] on 11/22/2018,  showed markedly hypocellular marrow with decreased megakaryocytes.   RADIOGRAPHIC STUDIES: 1. 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  2. Multichamber cardiac enlargement, mild pulmonary edema, mild body wall edema, small left pleural effusion are concerning for fluid overload/congestive heart failure.  Result Narrative  CT CHEST WO CONTRAST, 04/25/2019 1:10 PM  INDICATION:Lung nodule, multiple < 75m, follow up exam \ new lung nodules seen on CXR, concern for atypical infection \ R91.8 Lung nodule, multiple  COMPARISON: 04/09/2019  TECHNIQUE: Multislice axial images were obtained through the chest without administration of iodinated intravenous contrast material. Multi-planar reformatted images were generated for additional analysis. Nongated technique limits cardiac detail.  [facility] using dose optimization techniques as appropriate to a performed exam, including but not limited to one or more of the following: automated exposure control, adjustment of the mA and/or kV according to patient size, use of iterative reconstruction technique. In addition, [facility] is participating in the [place]program which will further assist [place] optimizing patient radiation exposure.   FINDINGS:   Thoracic inlet/central airways: Thyroid normal. Airway patent. Mediastinum/hila/axilla: No adenopathy. Heart/vessels: Multichamber cardiac enlargement. Small pericardial effusion. Aorta normal in caliber. Lungs/pleura: Mild pulmonary edema New 86msolid nodule in the left upper lobe (series 3, image 52). New 45m91molid nodule in the right upper lobe (series 3, image 113). Additional scattered new micronodules as marked on series 3 in PACS). These  nodules and micronodules demonstrate a peripheral groundglass halo appearance.   Partially calcified nodules in the left upper lobe (series 3, images 78-83).   Left lower lobe compressive atelectasis.  Similar calcified lentiform structure in the left lateral pleura which may relate to prior hemorrhage. Small left pleural effusion. Upper abdomen: Similar hypodense 2.3 cm structure with punctate peripheral calcification arising from the interpolar region of the right kidney compatible with a minimally complex cyst. Subcentimeter hypodensities in the liver which are too small to characterize. Chest wall/MSK: Remote depression of the superior endplates of T5, 11, and T12. Prominent Schmorl's node T12. No acute osseous abnormality.  Other Result Information  Interface, Rad Results In - 04/25/2019  2:41 PM EST CT CHEST WO CONTRAST, 04/25/2019 1:10 PM  INDICATION:Lung nodule, multiple < 6mm72mollow up exam \ new lung nodules seen on CXR, concern for atypical infection \ R91.8 Lung nodule, multiple  COMPARISON: 04/09/2019  TECHNIQUE: Multislice axial images were obtained through the chest without administration of iodinated intravenous contrast material. Multi-planar reformatted images were generated for additional analysis. Nongated technique limits cardiac detail.  [facility] performed using dose optimization techniques as appropriate to a performed exam, including but not limited to one or more of the following: automated exposure control, adjustment of the mA and/or kV according to patient size, use of iterative reconstruction technique. In addition, [facility] is participating in the ACR [place]gram which will further assist us i[place]optimizing patient radiation exposure.   FINDINGS:   Thoracic inlet/central airways: Thyroid normal. Airway patent. Mediastinum/hila/axilla: No adenopathy. Heart/vessels: Multichamber  cardiac enlargement. Small pericardial effusion. Aorta normal in caliber. Lungs/pleura: Mild pulmonary edema New 45mm 24mid nodule in the left upper lobe (series 3, image 52). New 45mm s52md nodule in the right upper lobe (series 3, image 113). Additional scattered new micronodules as marked on series 3 in PACS). These nodules and micronodules demonstrate a peripheral groundglass halo appearance.  Partially calcified nodules in the left upper lobe (series 3, images 78-83).   Left lower lobe compressive atelectasis.  Similar calcified lentiform structure in the left lateral pleura which may relate to prior hemorrhage. Small left pleural effusion. Upper abdomen: Similar hypodense 2.3 cm structure with punctate peripheral calcification arising from the interpolar region of the right kidney compatible with a minimally complex cyst. Subcentimeter hypodensities in the liver which are too small to characterize. Chest wall/MSK: Remote depression of the superior endplates of T5, 11, and T12. Prominent Schmorl's node T12. No acute osseous abnormality.   CONCLUSION:  1.  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  2.  Multichamber cardiac enlargement, mild pulmonary edema, mild body wall edema, small left pleural effusion are concerning for fluid overload/congestive heart failure.    ASSESSMENT & PLAN Victor Porter 58 y.o. male with medical history significant for aplastic anemia who presents to establish care with the [facility] for supportive care.  After review of his extensive outside history and after discussion with Dr. Joan Mayans I am in agreement that the findings are most consistent with an aplastic anemia.  Unfortunately he has failed standard therapy with horse ATG and subsequently has been trialed on salvage therapy with rabbit  ATG.  Overall I worry that his prognosis is poor, however we are more than happy to offer supportive care locally while his primary hematologist at [facility] takes care of the further planning of his treatments.    After discussion with the patient and Dr. Joan Mayans we have decided to take a comanage care approach where [facility] takes care of his day-to-day transfusion needs and Dr. Joan Mayans helps coordinate larger efforts at [facility].  In the event the patient were to become sick with an infection or have other large needs requiring hospitalization we would recommend that he present to [facility], as he has established with numerous services there and they are familiar with his care.  In the interim we are more than happy to provide supportive services and provide any urgent needs he may have in the area.    In the event that his rabbit ATG salvage therapy does not provide a good response we will need to have further discussion about bone marrow transplant.  If for whatever reason bone marrow transplant is not available to this patient comfort care would be most appropriate.  We may also run into issues where with these frequent transfusions the patient no longer has compatible units.  We will continue to to monitor for the development of alloantibodies.  #Aplastic Anemia, s/p rATG salvage therapy  -- after discussion with Dr. Joan Mayans, we have agreed to assist with local management of Victor Porter's aplastic anemia. We can provide local supportive care with transfusions and urgent needs, however if he were to require further inpatient treatment of acquire an infection we would recommend transfer to [facility] where the specialists and services are familiar with him. --recommend periodic visits with Dr. Joan Mayans to help guide treatment management, with local support here at the [facility]. --continue the patient's Promacta, cyclosporin, and prednisone therapy    --agree with frequent CBC checks for transfusion support. Will set a goal of Hgb >7.0 and Plt >10. We will plan for him to be evaluated 3 times weekly initially and can change this based on  his needs. Next visit on Monday 05/30/2019 for labs and transfusion if required.  --recently finished rATG salvage therapy, currently under consideration for bone marrow transplant at [facility]. --may need to consider palliative care and goals of care discussions as he is prone to infections and may become hospitalized in the near future with a serious bleed or infection. Can propose this at our next clinic visit in 4 weeks time.  --RTC to see me in 4 weeks time. Return to infusion 3 x weekly for labs and transfusion (Mon/Wed/Sat during holidays).  #Infectious Disease --continue dapsone (started 05/19/2019) for PCP prophylaxis. Patient is G6PD negative --continue acyclovir for viral prophylaxis.  --continue voriconzaole (started 04/25/19) on concern for angioinvasive fungal PNA based on Chest CT at [facility] on 04/25/2019. --completed course of cefazolin for Stenotrophomonas bacteremia.  #Cheek/Tongue Lesion --concern for neoplasm, has had biopsies performed at [facility] which reveal no malignancy --pain control with peridex mouthwash and oxycodone solution --not actively bleeding or irritating to the patient.   Orders Placed This Encounter  Procedures   Prepare Pheresed Platelets    Standing Status:   Standing    Number of Occurrences:   1    Order Specific Question:   Number of Apheresis Units    Answer:   2 units (12-20 packs)    Order Specific Question:   Transfusion Indications    Answer:   PLT Count </=10,000/mm    All questions were answered. The patient knows to call the clinic with any problems, questions or concerns.  A total of more than 60 minutes were spent on this encounter and over half of that time was spent on counseling and coordination of care as outlined above.   Ledell Peoples,  MD Department of Hematology/Oncology [place] at [facility] Phone: [phone] Pager: [phone] Email: [email or web address]  05/28/2019 11:23 AM

## 2019-05-28 ENCOUNTER — Encounter: Payer: Self-pay | Admitting: Hematology and Oncology

## 2019-05-28 LAB — ABO/RH: ABO/RH(D): O POS

## 2019-05-28 LAB — TYPE AND SCREEN
ABO/RH(D): O POS
Antibody Screen: NEGATIVE

## 2019-05-29 LAB — PREPARE PLATELET PHERESIS
Unit division: 0
Unit division: 0

## 2019-05-29 LAB — BPAM PLATELET PHERESIS
Blood Product Expiration Date: 202012192359
Blood Product Expiration Date: 202012202359
ISSUE DATE / TIME: 202012181149
ISSUE DATE / TIME: 202012181251
Unit Type and Rh: 1700
Unit Type and Rh: 6200

## 2019-05-30 ENCOUNTER — Telehealth: Payer: Self-pay | Admitting: Hematology and Oncology

## 2019-05-30 ENCOUNTER — Inpatient Hospital Stay: Payer: Self-pay

## 2019-05-30 ENCOUNTER — Other Ambulatory Visit: Payer: Self-pay | Admitting: *Deleted

## 2019-05-30 ENCOUNTER — Inpatient Hospital Stay: Payer: Self-pay | Admitting: Hematology and Oncology

## 2019-05-30 ENCOUNTER — Other Ambulatory Visit: Payer: Self-pay

## 2019-05-30 DIAGNOSIS — D619 Aplastic anemia, unspecified: Secondary | ICD-10-CM

## 2019-05-30 DIAGNOSIS — D61818 Other pancytopenia: Secondary | ICD-10-CM

## 2019-05-30 LAB — CBC WITH DIFFERENTIAL (CANCER CENTER ONLY)
Abs Immature Granulocytes: 0 10*3/uL (ref 0.00–0.07)
Basophils Absolute: 0 10*3/uL (ref 0.0–0.1)
Basophils Relative: 0 %
Eosinophils Absolute: 0 10*3/uL (ref 0.0–0.5)
Eosinophils Relative: 0 %
HCT: 20.7 % — ABNORMAL LOW (ref 39.0–52.0)
Hemoglobin: 7.1 g/dL — ABNORMAL LOW (ref 13.0–17.0)
Immature Granulocytes: 0 %
Lymphocytes Relative: 22 %
Lymphs Abs: 0 10*3/uL — ABNORMAL LOW (ref 0.7–4.0)
MCH: 29.6 pg (ref 26.0–34.0)
MCHC: 34.3 g/dL (ref 30.0–36.0)
MCV: 86.3 fL (ref 80.0–100.0)
Monocytes Absolute: 0 10*3/uL — ABNORMAL LOW (ref 0.1–1.0)
Monocytes Relative: 17 %
Neutro Abs: 0.1 10*3/uL — CL (ref 1.7–7.7)
Neutrophils Relative %: 61 %
Platelet Count: 9 10*3/uL — CL (ref 150–400)
RBC: 2.4 MIL/uL — ABNORMAL LOW (ref 4.22–5.81)
RDW: 14 % (ref 11.5–15.5)
WBC Count: 0.2 10*3/uL — CL (ref 4.0–10.5)
nRBC: 0 % (ref 0.0–0.2)

## 2019-05-30 LAB — SAMPLE TO BLOOD BANK

## 2019-05-30 MED ORDER — ACETAMINOPHEN 325 MG PO TABS
ORAL_TABLET | ORAL | Status: AC
  Filled 2019-05-30: qty 2

## 2019-05-30 MED ORDER — SODIUM CHLORIDE 0.9% IV SOLUTION
250.0000 mL | Freq: Once | INTRAVENOUS | Status: AC
  Administered 2019-05-30: 11:00:00 250 mL via INTRAVENOUS
  Filled 2019-05-30: qty 250

## 2019-05-30 MED ORDER — SODIUM CHLORIDE 0.9% FLUSH
3.0000 mL | INTRAVENOUS | Status: AC | PRN
  Administered 2019-05-30: 12:00:00 3 mL
  Filled 2019-05-30: qty 10

## 2019-05-30 MED ORDER — HEPARIN SOD (PORK) LOCK FLUSH 100 UNIT/ML IV SOLN
250.0000 [IU] | INTRAVENOUS | Status: AC | PRN
  Administered 2019-05-30: 250 [IU]
  Filled 2019-05-30: qty 5

## 2019-05-30 MED ORDER — DIPHENHYDRAMINE HCL 25 MG PO CAPS
ORAL_CAPSULE | ORAL | Status: AC
  Filled 2019-05-30: qty 1

## 2019-05-30 NOTE — Patient Instructions (Signed)
Blood Transfusion, Adult, Care After This sheet gives you information about how to care for yourself after your procedure. Your doctor may also give you more specific instructions. If you have problems or questions, contact your doctor. Follow these instructions at home:   Take over-the-counter and prescription medicines only as told by your doctor.  Go back to your normal activities as told by your doctor.  Follow instructions from your doctor about how to take care of the area where an IV tube was put into your vein (insertion site). Make sure you: ? Wash your hands with soap and water before you change your bandage (dressing). If there is no soap and water, use hand sanitizer. ? Change your bandage as told by your doctor.  Check your IV insertion site every day for signs of infection. Check for: ? More redness, swelling, or pain. ? More fluid or blood. ? Warmth. ? Pus or a bad smell. Contact a doctor if:  You have more redness, swelling, or pain around the IV insertion site.  You have more fluid or blood coming from the IV insertion site.  Your IV insertion site feels warm to the touch.  You have pus or a bad smell coming from the IV insertion site.  Your pee (urine) turns pink, red, or brown.  You feel weak after doing your normal activities. Get help right away if:  You have signs of a serious allergic or body defense (immune) system reaction, including: ? Itchiness. ? Hives. ? Trouble breathing. ? Anxiety. ? Pain in your chest or lower back. ? Fever, flushing, and chills. ? Fast pulse. ? Rash. ? Watery poop (diarrhea). ? Throwing up (vomiting). ? Dark pee. ? Serious headache. ? Dizziness. ? Stiff neck. ? Yellow color in your face or the white parts of your eyes (jaundice). Summary  After a blood transfusion, return to your normal activities as told by your doctor.  Every day, check for signs of infection where the IV tube was put into your vein.  Some  signs of infection are warm skin, more redness and pain, more fluid or blood, and pus or a bad smell where the needle went in.  Contact your doctor if you feel weak or have any unusual symptoms. This information is not intended to replace advice given to you by your health care provider. Make sure you discuss any questions you have with your health care provider. Document Released: 06/16/2014 Document Revised: 09/30/2017 Document Reviewed: 01/18/2016 Elsevier Patient Education  2020 Elsevier Inc.  

## 2019-05-30 NOTE — Telephone Encounter (Signed)
Scheduled apt per 12/21 sch message - pt is aware of appt date and time

## 2019-05-31 ENCOUNTER — Other Ambulatory Visit: Payer: Self-pay | Admitting: *Deleted

## 2019-05-31 DIAGNOSIS — D619 Aplastic anemia, unspecified: Secondary | ICD-10-CM

## 2019-05-31 LAB — PREPARE PLATELET PHERESIS: Unit division: 0

## 2019-05-31 LAB — BPAM PLATELET PHERESIS
Blood Product Expiration Date: 202012232359
ISSUE DATE / TIME: 202012211048
Unit Type and Rh: 6200

## 2019-06-01 ENCOUNTER — Telehealth: Payer: Self-pay

## 2019-06-01 ENCOUNTER — Inpatient Hospital Stay: Payer: Self-pay

## 2019-06-01 ENCOUNTER — Other Ambulatory Visit: Payer: Self-pay | Admitting: Medical

## 2019-06-01 ENCOUNTER — Inpatient Hospital Stay (HOSPITAL_BASED_OUTPATIENT_CLINIC_OR_DEPARTMENT_OTHER): Payer: Self-pay | Admitting: Medical

## 2019-06-01 ENCOUNTER — Other Ambulatory Visit: Payer: Self-pay

## 2019-06-01 ENCOUNTER — Other Ambulatory Visit: Payer: Self-pay | Admitting: *Deleted

## 2019-06-01 ENCOUNTER — Telehealth: Payer: Self-pay | Admitting: *Deleted

## 2019-06-01 DIAGNOSIS — D619 Aplastic anemia, unspecified: Secondary | ICD-10-CM

## 2019-06-01 DIAGNOSIS — L299 Pruritus, unspecified: Secondary | ICD-10-CM

## 2019-06-01 DIAGNOSIS — D61818 Other pancytopenia: Secondary | ICD-10-CM

## 2019-06-01 LAB — CBC WITH DIFFERENTIAL (CANCER CENTER ONLY)
Abs Immature Granulocytes: 0 10*3/uL (ref 0.00–0.07)
Basophils Absolute: 0 10*3/uL (ref 0.0–0.1)
Basophils Relative: 0 %
Eosinophils Absolute: 0 10*3/uL (ref 0.0–0.5)
Eosinophils Relative: 0 %
HCT: 18.4 % — ABNORMAL LOW (ref 39.0–52.0)
Hemoglobin: 6.4 g/dL — CL (ref 13.0–17.0)
Immature Granulocytes: 0 %
Lymphocytes Relative: 15 %
Lymphs Abs: 0 10*3/uL — ABNORMAL LOW (ref 0.7–4.0)
MCH: 30.3 pg (ref 26.0–34.0)
MCHC: 34.8 g/dL (ref 30.0–36.0)
MCV: 87.2 fL (ref 80.0–100.0)
Monocytes Absolute: 0 10*3/uL — ABNORMAL LOW (ref 0.1–1.0)
Monocytes Relative: 15 %
Neutro Abs: 0.1 10*3/uL — CL (ref 1.7–7.7)
Neutrophils Relative %: 70 %
Platelet Count: 6 10*3/uL — CL (ref 150–400)
RBC: 2.11 MIL/uL — ABNORMAL LOW (ref 4.22–5.81)
RDW: 14 % (ref 11.5–15.5)
WBC Count: 0.2 10*3/uL — CL (ref 4.0–10.5)
nRBC: 0 % (ref 0.0–0.2)

## 2019-06-01 LAB — CMP (CANCER CENTER ONLY)
ALT: 57 U/L — ABNORMAL HIGH (ref 0–44)
AST: 28 U/L (ref 15–41)
Albumin: 2.4 g/dL — ABNORMAL LOW (ref 3.5–5.0)
Alkaline Phosphatase: 121 U/L (ref 38–126)
Anion gap: 9 (ref 5–15)
BUN: 37 mg/dL — ABNORMAL HIGH (ref 6–20)
CO2: 22 mmol/L (ref 22–32)
Calcium: 8 mg/dL — ABNORMAL LOW (ref 8.9–10.3)
Chloride: 101 mmol/L (ref 98–111)
Creatinine: 1.21 mg/dL (ref 0.61–1.24)
GFR, Est AFR Am: >60 mL/min (ref >60)
GFR, Estimated: >60 mL/min (ref >60)
Glucose, Bld: 161 mg/dL — ABNORMAL HIGH (ref 70–99)
Potassium: 4.5 mmol/L (ref 3.5–5.1)
Sodium: 132 mmol/L — ABNORMAL LOW (ref 135–145)
Total Bilirubin: 0.8 mg/dL (ref 0.3–1.2)
Total Protein: 5.9 g/dL — ABNORMAL LOW (ref 6.5–8.1)

## 2019-06-01 LAB — SAMPLE TO BLOOD BANK

## 2019-06-01 LAB — PREPARE RBC (CROSSMATCH)

## 2019-06-01 MED ORDER — SODIUM CHLORIDE 0.9% IV SOLUTION
250.0000 mL | Freq: Once | INTRAVENOUS | Status: AC
  Filled 2019-06-01: qty 250

## 2019-06-01 MED ORDER — ACETAMINOPHEN 325 MG PO TABS
ORAL_TABLET | ORAL | Status: AC
  Filled 2019-06-01: qty 2

## 2019-06-01 MED ORDER — ACETAMINOPHEN 325 MG PO TABS
650.0000 mg | ORAL_TABLET | Freq: Once | ORAL | Status: AC
  Administered 2019-06-01: 10:00:00 650 mg via ORAL

## 2019-06-01 MED ORDER — HEPARIN SOD (PORK) LOCK FLUSH 100 UNIT/ML IV SOLN
250.0000 [IU] | INTRAVENOUS | Status: AC | PRN
  Administered 2019-06-01: 16:00:00 250 [IU]
  Filled 2019-06-01: qty 5

## 2019-06-01 MED ORDER — DIPHENHYDRAMINE HCL 50 MG/ML IJ SOLN
25.0000 mg | Freq: Once | INTRAMUSCULAR | Status: AC
  Administered 2019-06-01: 25 mg via INTRAVENOUS

## 2019-06-01 MED ORDER — SODIUM CHLORIDE 0.9% IV SOLUTION
250.0000 mL | Freq: Once | INTRAVENOUS | Status: AC
  Administered 2019-06-01: 10:00:00 250 mL via INTRAVENOUS
  Filled 2019-06-01: qty 250

## 2019-06-01 MED ORDER — PREDNISONE 20 MG PO TABS
20.0000 mg | ORAL_TABLET | Freq: Once | ORAL | Status: AC
  Administered 2019-06-01: 13:00:00 20 mg via ORAL

## 2019-06-01 MED ORDER — SODIUM CHLORIDE 0.9% FLUSH
10.0000 mL | INTRAVENOUS | Status: AC | PRN
  Administered 2019-06-01: 10 mL
  Filled 2019-06-01: qty 10

## 2019-06-01 NOTE — Progress Notes (Signed)
During 1st unit of PRBCs, patient began c/o itching with urticaria to bilateral arms. PRBCs paused, hypersensitivity protocol initiated, and Sandi Mealy PA-C evaluated patient in treatment area. Medicated per MAR. Patient quickly returned to baseline and transfusion resumed and completed without further incident.

## 2019-06-01 NOTE — Telephone Encounter (Signed)
Reported ANC of 100 per lab to Dr Lorenso Courier who had already seen result.

## 2019-06-01 NOTE — Patient Instructions (Signed)

## 2019-06-01 NOTE — Telephone Encounter (Signed)
Critical labs: WBC, Hgb, and Plts. verbally given to Dr. Lorenso Courier.

## 2019-06-02 ENCOUNTER — Other Ambulatory Visit: Payer: Self-pay | Admitting: *Deleted

## 2019-06-02 DIAGNOSIS — E876 Hypokalemia: Secondary | ICD-10-CM

## 2019-06-02 DIAGNOSIS — D619 Aplastic anemia, unspecified: Secondary | ICD-10-CM

## 2019-06-02 DIAGNOSIS — D61818 Other pancytopenia: Secondary | ICD-10-CM

## 2019-06-02 LAB — BPAM RBC
Blood Product Expiration Date: 202101242359
Blood Product Expiration Date: 202101242359
ISSUE DATE / TIME: 202012231033
ISSUE DATE / TIME: 202012231033
Unit Type and Rh: 5100
Unit Type and Rh: 5100

## 2019-06-02 LAB — BPAM PLATELET PHERESIS
Blood Product Expiration Date: 202012232359
ISSUE DATE / TIME: 202012231025
Unit Type and Rh: 8400

## 2019-06-02 LAB — TYPE AND SCREEN
ABO/RH(D): O POS
Antibody Screen: NEGATIVE
Unit division: 0
Unit division: 0

## 2019-06-02 LAB — PREPARE PLATELET PHERESIS: Unit division: 0

## 2019-06-02 NOTE — Progress Notes (Signed)
This patient was seen in the infusion room as she was receiving a transfusion of packed red blood cells.  He developed itching.  He was given Benadryl 25 mg IV x1 and was prednisone 20 mg p.o. x1.  He was able to restart and complete his transfusion without any other issues of concern.  Sandi Mealy, MHS, PA-C Physician Assistant

## 2019-06-03 ENCOUNTER — Other Ambulatory Visit: Payer: Self-pay

## 2019-06-04 ENCOUNTER — Other Ambulatory Visit: Payer: Self-pay

## 2019-06-04 ENCOUNTER — Inpatient Hospital Stay: Payer: Self-pay

## 2019-06-06 ENCOUNTER — Inpatient Hospital Stay: Payer: Self-pay

## 2019-06-06 ENCOUNTER — Telehealth: Payer: Self-pay | Admitting: *Deleted

## 2019-06-06 ENCOUNTER — Other Ambulatory Visit: Payer: Self-pay | Admitting: *Deleted

## 2019-06-06 ENCOUNTER — Other Ambulatory Visit: Payer: Self-pay

## 2019-06-06 ENCOUNTER — Other Ambulatory Visit: Payer: Self-pay | Admitting: Family

## 2019-06-06 VITALS — BP 118/86 | HR 105 | Temp 97.5°F | Resp 20

## 2019-06-06 DIAGNOSIS — D696 Thrombocytopenia, unspecified: Secondary | ICD-10-CM

## 2019-06-06 DIAGNOSIS — D619 Aplastic anemia, unspecified: Secondary | ICD-10-CM

## 2019-06-06 DIAGNOSIS — E876 Hypokalemia: Secondary | ICD-10-CM

## 2019-06-06 DIAGNOSIS — Z452 Encounter for adjustment and management of vascular access device: Secondary | ICD-10-CM

## 2019-06-06 DIAGNOSIS — D61818 Other pancytopenia: Secondary | ICD-10-CM

## 2019-06-06 LAB — CBC WITH DIFFERENTIAL (CANCER CENTER ONLY)
Abs Immature Granulocytes: 0.01 10*3/uL (ref 0.00–0.07)
Abs Immature Granulocytes: 0.01 10*3/uL (ref 0.00–0.07)
Basophils Absolute: 0 10*3/uL (ref 0.0–0.1)
Basophils Absolute: 0 10*3/uL (ref 0.0–0.1)
Basophils Relative: 0 %
Basophils Relative: 0 %
Eosinophils Absolute: 0 10*3/uL (ref 0.0–0.5)
Eosinophils Absolute: 0 10*3/uL (ref 0.0–0.5)
Eosinophils Relative: 0 %
Eosinophils Relative: 0 %
HCT: 22.2 % — ABNORMAL LOW (ref 39.0–52.0)
HCT: 21.9 % — ABNORMAL LOW (ref 39.0–52.0)
Hemoglobin: 7.7 g/dL — ABNORMAL LOW (ref 13.0–17.0)
Hemoglobin: 7.6 g/dL — ABNORMAL LOW (ref 13.0–17.0)
Immature Granulocytes: 3 %
Immature Granulocytes: 3 %
Lymphocytes Relative: 31 %
Lymphocytes Relative: 34 %
Lymphs Abs: 0.1 10*3/uL — ABNORMAL LOW (ref 0.7–4.0)
Lymphs Abs: 0.1 10*3/uL — ABNORMAL LOW (ref 0.7–4.0)
MCH: 29.4 pg (ref 26.0–34.0)
MCH: 29.6 pg (ref 26.0–34.0)
MCHC: 34.7 g/dL (ref 30.0–36.0)
MCHC: 34.7 g/dL (ref 30.0–36.0)
MCV: 84.7 fL (ref 80.0–100.0)
MCV: 85.2 fL (ref 80.0–100.0)
Monocytes Absolute: 0.1 10*3/uL (ref 0.1–1.0)
Monocytes Absolute: 0.1 10*3/uL (ref 0.1–1.0)
Monocytes Relative: 17 %
Monocytes Relative: 13 %
Neutro Abs: 0.2 10*3/uL — CL (ref 1.7–7.7)
Neutro Abs: 0.2 10*3/uL — CL (ref 1.7–7.7)
Neutrophils Relative %: 49 %
Neutrophils Relative %: 50 %
Platelet Count: <5 10*3/uL — CL (ref 150–400)
Platelet Count: 58 10*3/uL — ABNORMAL LOW (ref 150–400)
RBC: 2.62 MIL/uL — ABNORMAL LOW (ref 4.22–5.81)
RBC: 2.57 MIL/uL — ABNORMAL LOW (ref 4.22–5.81)
RDW: 14.1 % (ref 11.5–15.5)
RDW: 14.2 % (ref 11.5–15.5)
WBC Count: 0.4 10*3/uL — CL (ref 4.0–10.5)
WBC Count: 0.4 10*3/uL — CL (ref 4.0–10.5)
nRBC: 0 % (ref 0.0–0.2)
nRBC: 0 % (ref 0.0–0.2)

## 2019-06-06 LAB — CMP (CANCER CENTER ONLY)
ALT: 92 U/L — ABNORMAL HIGH (ref 0–44)
AST: 38 U/L (ref 15–41)
Albumin: 2.9 g/dL — ABNORMAL LOW (ref 3.5–5.0)
Alkaline Phosphatase: 134 U/L — ABNORMAL HIGH (ref 38–126)
Anion gap: 7 (ref 5–15)
BUN: 32 mg/dL — ABNORMAL HIGH (ref 6–20)
CO2: 24 mmol/L (ref 22–32)
Calcium: 8.3 mg/dL — ABNORMAL LOW (ref 8.9–10.3)
Chloride: 100 mmol/L (ref 98–111)
Creatinine: 1.38 mg/dL — ABNORMAL HIGH (ref 0.61–1.24)
GFR, Est AFR Am: >60 mL/min (ref >60)
GFR, Estimated: 56 mL/min — ABNORMAL LOW (ref >60)
Glucose, Bld: 110 mg/dL — ABNORMAL HIGH (ref 70–99)
Potassium: 4.7 mmol/L (ref 3.5–5.1)
Sodium: 131 mmol/L — ABNORMAL LOW (ref 135–145)
Total Bilirubin: 1.4 mg/dL — ABNORMAL HIGH (ref 0.3–1.2)
Total Protein: 5.6 g/dL — ABNORMAL LOW (ref 6.5–8.1)

## 2019-06-06 LAB — ABO/RH: ABO/RH(D): O POS

## 2019-06-06 LAB — TYPE AND SCREEN
ABO/RH(D): O POS
Antibody Screen: NEGATIVE

## 2019-06-06 MED ORDER — ACETAMINOPHEN 325 MG PO TABS
ORAL_TABLET | ORAL | Status: AC
  Filled 2019-06-06: qty 2

## 2019-06-06 MED ORDER — HEPARIN SOD (PORK) LOCK FLUSH 100 UNIT/ML IV SOLN
250.0000 [IU] | INTRAVENOUS | Status: DC | PRN
  Filled 2019-06-06: qty 5

## 2019-06-06 MED ORDER — SODIUM CHLORIDE 0.9% FLUSH
10.0000 mL | INTRAVENOUS | Status: AC | PRN
  Administered 2019-06-06: 10 mL
  Filled 2019-06-06: qty 10

## 2019-06-06 MED ORDER — ACETAMINOPHEN 325 MG PO TABS
650.0000 mg | ORAL_TABLET | Freq: Once | ORAL | Status: AC
  Administered 2019-06-06: 650 mg via ORAL

## 2019-06-06 MED ORDER — DIPHENHYDRAMINE HCL 25 MG PO CAPS
50.0000 mg | ORAL_CAPSULE | Freq: Once | ORAL | Status: AC
  Administered 2019-06-06: 12:00:00 50 mg via ORAL

## 2019-06-06 MED ORDER — SODIUM CHLORIDE 0.9% FLUSH
10.0000 mL | INTRAVENOUS | Status: DC | PRN
  Filled 2019-06-06: qty 10

## 2019-06-06 MED ORDER — SODIUM CHLORIDE 0.9% IV SOLUTION
250.0000 mL | Freq: Once | INTRAVENOUS | Status: AC
  Administered 2019-06-06: 13:00:00 250 mL via INTRAVENOUS
  Filled 2019-06-06: qty 250

## 2019-06-06 MED ORDER — HEPARIN SOD (PORK) LOCK FLUSH 100 UNIT/ML IV SOLN
500.0000 [IU] | Freq: Every day | INTRAVENOUS | Status: AC | PRN
  Administered 2019-06-06: 16:00:00 500 [IU]
  Filled 2019-06-06: qty 5

## 2019-06-06 MED ORDER — DIPHENHYDRAMINE HCL 25 MG PO CAPS
ORAL_CAPSULE | ORAL | Status: AC
  Filled 2019-06-06: qty 2

## 2019-06-06 MED ORDER — SODIUM CHLORIDE 0.9% FLUSH
10.0000 mL | INTRAVENOUS | Status: AC | PRN
  Administered 2019-06-06: 16:00:00 10 mL
  Filled 2019-06-06: qty 10

## 2019-06-06 NOTE — Telephone Encounter (Signed)
Jory Ee NP notified of hgb-7.6, wbc-0.4, anc-0.2 and platelet count-58.  No new orders received at this time.

## 2019-06-06 NOTE — Patient Instructions (Signed)
Gua de cuidados en el hogar de catter central de insercin perifrica (CCIP) PICC Home Care Guide  Un catter central de insercin perifrica (CCIP) es una forma de Teacher, adult education intravenoso (I.V.) que permite que los medicamentos y los lquidos intravenosos (I.V.) se distribuyan rpidamente en el organismo. Un CCIP es un tubo [place], delgado y flexible (catter) que se inserta en una vena de la parte superior del brazo. El catter finaliza en una vena grande en el pecho (vena cava superior o VCS). Despus de que se inserta el catter, se puede tomar una radiografa de trax para asegurarse de que est en el lugar correcto. Puede ser necesario colocar el catter por diferentes motivos, por ejemplo:  Para suministrar medicamentos y nutricin lquida.  Para administrar lquidos intravenosos (I.V.) y productos de Risk manager.  Si hay dificultad para colocar un catter intravenoso perifrico. Si se cuida adecuadamente, el catter [employer] en su lugar durante varios meses. Un CCIP tambin permite que la persona abandone el hospital y regrese a su casa ms rpidamente. Un familiar, mdico o un equipo de atencin mdica a domicilio pueden encargarse de los medicamentos y del cuidado del Brewing technologist. Cules son los riesgos? Generalmente, tener un CCIP es seguro. Sin embargo, pueden ocurrir Advice worker, por ejemplo:  Formacin de un cogulo sanguneo (trombo) en el CCIP o en la punta de [place].  Formacin de un cogulo sanguneo en una vena (trombosis venosa profunda) o un cogulo que se desplaza hasta el pulmn (embolia pulmonar).  Inflamacin de la vena (flebitis) en la que se coloc el CCIP.  Infeccin. La infeccin del torrente sanguneo asociada con la va central (CLABSI) es una infeccin grave que, por lo general, requiere de hospitalizacin.  Movimiento del CCIP (desplazamiento). La punta del CCIP se puede mover de su posicin original debido a una actividad fsica excesiva, tos forzada,  estornudos o vmitos.  Un corte o rotura del catter. Es importante no utilizar tijeras cerca del CCIP.  Irritacin o lesin del nervio o tendn durante la insercin del CCIP. Cmo cuidar Scientist, research (life sciences) problemas  Usted y los mdicos deben lavarse las manos con jabn, con frecuencia. Lvese las manos: ? Antes de tocar la lnea del CCIP o el dispositivo de infusin. ? Antes de cambiar una venda (vendaje).  Irrigue el CCIP en la forma en que le indic el profesional. Informe de inmediato al mdico si le resulta difcil purgar el CCIP o no puede hacerlo. No use la fuerza para irrigarlo.  No use una jeringa de menos de 37ml para irrigarlo.  Evite los controles de la presin arterial en el brazo en el que se Garment/textile technologist.  Nunca tire del catter.  No se quite el catter usted mismo. Solo un profesional entrenado puede [place].  Use nicamente suministros limpios y estriles. Mantenga los suministros en un lugar seco. No vuelva a usar agujas, jeringas o dems suministros. Si lo hace, puede provocar una infeccin.  Mantenga a las Neurosurgeon y a los nios alejados del CCIP.  Controle Immunologist de la insercin del CCIP todos los das para detectar si hay signos de infeccin. Est atento a los siguientes signos: ? Prdidas. ? Dolor, hinchazn o enrojecimiento. ? Lquido o sangre. ? Calor. ? Pus o mal olor. Cuidado del vendaje del CCIP  Mantenga el apsito del catter (vendaje) limpio y seco para evitar las infecciones.  No tome baos de inmersin, no nade ni use el jacuzzi hasta que el mdico lo autorice. Pregntele al  mdico si puede ducharse. Delle Reining solo le permitan tomar baos de [place]. Cuando se le permita baarse: ? Pdale al mdico que le ensee cmo vendar el CCIP. ? Cubra el CCIP con un envoltorio de plstico limpio y cinta adhesiva para que no se moje mientras se ducha.  Siga las indicaciones del mdico acerca de los cuidados del lugar de la insercin y el vendaje. Haga lo  siguiente: ? Lvese las manos con agua y jabn antes de Multimedia programmer las vendas (vendajes). Use desinfectante para manos si no dispone de [place] y [place]. ? Cambie el vendaje como se lo haya indicado el mdico. ? No retire los puntos (suturas), la goma para cerrar la piel o las tiras [place]. Es posible que estos cierres cutneos Conservation officer, nature en la piel durante 2semanas o ms tiempo. Si los bordes de las tiras [address] empiezan a despegarse y Scientific laboratory technician, puede recortar los que estn sueltos. No retire las tiras [employer] por completo a menos que el mdico se lo indique.  Cmbielo si est flojo o mojado. Instrucciones generales   Lleve consigo una tarjeta de identificacin de CCIP o use un brazalete de alerta mdico en todo momento.  Mantenga el tubo sujetado en todo momento, excepto cuando se use.  Lleve consigo una abrazadera con bordes suaves para colocar en el tubo en caso de que se rompa.  No use tijeras u objetos punzantes cerca del tubo.  Puede inclinar el brazo y [employer]. Si el catter est cerca o en el lugar en que dobla el codo, evite la actividad en la que deba mover repetidas veces el codo.  Evite levantar objetos pesados como se lo haya indicado el mdico.  Concurra a todas las visitas de control como se lo haya indicado el mdico. Esto es importante. Eliminacin de suministros  Deseche las agujas y las jeringas en un contenedor para desechos destinado a objetos punzantes(recipiente para objetos punzantes). Puede comprar un recipiente para objetos punzantes en [place], o puede hacer uno usted mismo con una botella de plstico rgida y Theme park manager.  Coloque vendas usadas o bolsas de infusin en una bolsa plstica. Tire esa bolsa en el contenedor de basura. Comunquese con un mdico si:  Siente dolor en el brazo, el odo, la cara o los dientes.  Tiene fiebre o siente escalofros.  Enrojecimiento, hinchazn o dolor alrededor del lugar de la  insercin.  Tiene lquido o [employer] del Environmental consultant de la insercin.  El lugar de la insercin est caliente al tacto.  Tiene pus o percibe mal olor que proviene del lugar de la insercin.  La piel se siente dura y est elevada alrededor del lugar de la insercin. Solicite ayuda de inmediato si:  El CCIP se Civil Service fast streamer. Si esto ocurre, cubra el lugar de la insercin con un vendaje o una gasa. No deseche el CCIP. El Chief Executive Officer.  El catter fue tironeado y se ha salido parcialmente. No trate de volver a Ecologist.  Usted no puede purgarlo, le resulta difcil hacerlo o pierde lquido alrededor del Environmental consultant de insercin cuando lo hace.  Escucha un ruido al purgarlo.  Siente que el corazn late rpidamente o faltan latidos.  El CCIP tiene un orificio o una rotura.  Tiene hinchado el brazo [employer] [place] [employer] se insert el catter.  Tiene una mancha roja subindole por el brazo desde el lugar de insercin del CCIP. Resumen  Un Catter central de insercin perifrica (CCIP) es un tubo [place], delgado  y flexible (catter) que se inserta en una vena de la parte superior del brazo.  El catter lo inserta un enfermero o un mdico, utilizando una tcnica estril. Solo un profesional entrenado puede retirarlo.  Lleve la identificacin siempre con usted.  Evite los controles de la presin arterial en el brazo en el que se Location manager.  Si se cuida adecuadamente, el catter [employer] en su lugar durante varios meses. Un CCIP tambin permite que la persona abandone el hospital y regrese a su casa ms rpidamente. Esta informacin no tiene Marine scientist el consejo del mdico. Asegrese de hacerle al mdico cualquier pregunta que tenga. Document Released: 03/04/2008 Document Revised: 11/05/2016 Document Reviewed: 11/05/2016 Elsevier Patient Education  2020 [employer].

## 2019-06-06 NOTE — Patient Instructions (Signed)
Transfusin de plaquetas Platelet Transfusion La transfusin de plaquetas es un procedimiento en el cual se reciben plaquetas de un donante a travs de una va intravenosa. Las plaquetas son fragmentos diminutos de clulas sanguneas. Cuando una persona sufre una lesin, las plaquetas se agrupan en la zona lesionada para formar un cogulo de [place]. Esto ayuda a Sales promotion account executive y es el principio del proceso de Development worker, community. Si tiene [employer], es posible que su sangre tenga problemas para Designer, fashion/clothing. Esto puede causar sangrado y moretones con mucha facilidad. Tal vez deba recibir una transfusin de plaquetas si tiene una enfermedad que reduce el nmero de plaquetas (trombocitopenia). Se puede usar una transfusin de plaquetas para detener o evitar el sangrado excesivo. Informe al mdico acerca de lo siguiente:  Las reacciones que haya tenido durante transfusiones previas.  Cualquier alergia que tenga.  Todos los [employer], incluidos vitaminas, hierbas, gotas oftlmicas, cremas y medicamentos de venta libre.  Cualquier enfermedad de la sangre que tenga.  Cirugas previas a las que se someti.  Cualquier afeccin mdica que tenga.  Si est embarazada o podra estarlo. Cules son los riesgos? En general, se trata de un procedimiento seguro. Sin embargo, pueden ocurrir complicaciones, por ejemplo:  Cristy Hilts.  Infeccin.  Reaccin alrgica a las plaquetas del donante.  Que el sistema de su cuerpo que combate las enfermedades (sistema inmunitario) ataque las plaquetas del donante (reaccin hemoltica). Esto es poco frecuente.  Una reaccin poco frecuente que causa dao pulmonar (lesin pulmonar aguda producida por transfusin). Qu ocurre antes del procedimiento? Medicamentos  Consulte al mdico sobre: ? Quarry manager o suspender los medicamentos que toma habitualmente. Esto es muy importante si toma medicamentos para la diabetes o anticoagulantes. ? Tomar  medicamentos como aspirina e ibuprofeno. Estos medicamentos pueden tener un efecto anticoagulante en la [place]. No tome estos medicamentos a menos que el mdico se lo indique. ? Tomar medicamentos de [employer], vitaminas, hierbas y suplementos. Instrucciones generales  Se le realizar un anlisis de sangre para determinar su grupo sanguneo. Su tipo de sangre determina qu tipo de plaquetas recibir.  Siga las indicaciones del mdico respecto de las restricciones para las comidas o las bebidas.  Si tuvo una reaccin alrgica a una transfusin en el pasado, tal vez le administren un medicamento que ayude a evitarla.  Le controlarn la temperatura, la presin arterial, el pulso y la respiracin. Qu ocurre durante el procedimiento?   Le colocarn una va intravenosa en una de las venas.  Por su seguridad, dos mdicos verificarn su identidad y las plaquetas del donante que se le van a infundir.  Una bolsa con las plaquetas del donante se conectar a su va intravenosa. Las plaquetas ingresarn en su torrente sanguneo. Esto generalmente demora entre 30 y 53 minutos.  Durante la transfusin, le controlarn la temperatura, la presin arterial, el pulso y la respiracin. Esto ayuda a detectar signos tempranos de una reaccin.  Tambin lo controlarn para Hydrographic surveyor otros sntomas que puedan indicar una reaccin, como escalofros, urticaria o picazn.  Si tiene signos de Freight forwarder, se suspender la transfusin y tal vez le administren un medicamento para Producer, television/film/video.  Una vez finalizada la transfusin, se retirar la va intravenosa.  Se puede aplicar presin en el lugar de la va intravenosa (i.v.) para detener cualquier sangrado.  El lugar de la va intravenosa (i.v.) se cubrir con un vendaje. Este procedimiento puede variar segn el mdico y el hospital. Sander Nephew sucede despus del procedimiento?  Le controlarn la presin arterial, la temperatura, el pulso  y la respiracin hasta que abandone el hospital o la clnica.  Es posible que tenga algunos moretones y Stage manager en el lugar de la va intravenosa (i.v.). Siga estas indicaciones en su casa: Medicamentos  Tome los medicamentos de venta libre y los recetados solamente como se lo haya indicado el mdico.  Hable con el mdico antes de tomar cualquier medicamento que contenga aspirina o antiinflamatorios no esteroideos (AINE). Estos medicamentos aumentan el riesgo de hemorragias peligrosas. Instrucciones generales  Cambie o retire Office manager se lo haya indicado el mdico.  Retome sus actividades normales como se lo haya indicado el mdico. Pregntele al mdico qu actividades son seguras para usted.  No tome baos de inmersin, no nade ni use el jacuzzi hasta que el mdico lo autorice. Pregntele al mdico si puede ducharse.  Controle el lugar de la va intravenosa (i.v.) [employer] para descartar signos de infeccin. Est atento a los siguientes signos: ? Dolor, hinchazn o enrojecimiento. ? Lquido o sangre. Si sale lquido o sangre del Environmental consultant de la va intravenosa (i.v.), ejerza presin firme con las manos sobre el vendaje que cubre la zona durante uno o dos minutos. Al hacerlo, debera detenerse el sangrado. ? Calor. ? Pus o mal olor.  Concurra a todas las visitas de control como se lo haya indicado el mdico. Esto es importante. Comunquese con un mdico si tiene:  Dolor de cabeza que no se alivia con medicamentos.  Ronchas, erupcin o picazn en la piel.  Nuseas o vmitos.  Cansancio o debilidad inusuales.  Signos de infeccin en el lugar de la va intravenosa (i.v.). Solicite ayuda de inmediato si:  Tiene fiebre o siente escalofros.  Orina con menos frecuencia que lo habitual.  La orina es de un color ms oscuro que lo normal.  Tiene alguno de los siguientes sntomas: ? Dificultad para Industrial/product designer. ? Dolor en la espalda, el abdomen o el pecho. ? Piel fra y  hmeda. ? Latidos cardacos acelerados. Resumen  Las plaquetas son fragmentos diminutos de clulas sanguneas que se agrupan para formar un cogulo de sangre cuando se produce una herida. Si tiene [employer], es posible que su sangre tenga problemas para Biochemist, clinical.  La transfusin de plaquetas es un procedimiento en el cual se reciben plaquetas de un donante a travs de una va intravenosa.  Se puede usar una transfusin de plaquetas para detener o evitar el sangrado excesivo.  Despus del procedimiento, revise el lugar de la va intravenosa (i.v.) todos los das para detectar signos de infeccin, como enrojecimiento, hinchazn, dolor o calor. Esta informacin no tiene Theme park manager el consejo del mdico. Asegrese de hacerle al mdico cualquier pregunta que tenga. Document Released: 09/11/2008 Document Revised: 08/03/2017 Document Reviewed: 08/03/2017 Elsevier Patient Education  2020 [employer].

## 2019-06-06 NOTE — Telephone Encounter (Signed)
Dr. Libby Maw RN, Drucie Ip notified of WBC-0.4, HGB-7.7, ANC-0.2 and platelet count-1.  Per orders from [facility], pt.'s goal for HGB is >7.0 and Platelets are >10.  Dr. Lorenso Courier is out of the office today.  Order received for patient to get 2 units of platelets today with CBC one hour after the end of platelets per S. [place] NP.  Orders placed per S. [place] NP.

## 2019-06-06 NOTE — Progress Notes (Signed)
Patient to receive 2 units platelets today. Explained to translator, verbalized understanding.

## 2019-06-07 LAB — BPAM PLATELET PHERESIS
Blood Product Expiration Date: 202012282359
Blood Product Expiration Date: 202012282359
ISSUE DATE / TIME: 202012281149
ISSUE DATE / TIME: 202012281149
Unit Type and Rh: 5100
Unit Type and Rh: 5100

## 2019-06-07 LAB — PREPARE PLATELET PHERESIS
Unit division: 0
Unit division: 0

## 2019-06-07 LAB — SAMPLE TO BLOOD BANK

## 2019-06-08 ENCOUNTER — Telehealth: Payer: Self-pay | Admitting: *Deleted

## 2019-06-08 ENCOUNTER — Inpatient Hospital Stay: Payer: Self-pay

## 2019-06-08 ENCOUNTER — Other Ambulatory Visit: Payer: Self-pay

## 2019-06-08 DIAGNOSIS — D619 Aplastic anemia, unspecified: Secondary | ICD-10-CM

## 2019-06-08 LAB — CMP (CANCER CENTER ONLY)
ALT: 63 U/L — ABNORMAL HIGH (ref 0–44)
AST: 28 U/L (ref 15–41)
Albumin: 2.9 g/dL — ABNORMAL LOW (ref 3.5–5.0)
Alkaline Phosphatase: 160 U/L — ABNORMAL HIGH (ref 38–126)
Anion gap: 8 (ref 5–15)
BUN: 31 mg/dL — ABNORMAL HIGH (ref 6–20)
CO2: 24 mmol/L (ref 22–32)
Calcium: 8.2 mg/dL — ABNORMAL LOW (ref 8.9–10.3)
Chloride: 101 mmol/L (ref 98–111)
Creatinine: 1.28 mg/dL — ABNORMAL HIGH (ref 0.61–1.24)
GFR, Est AFR Am: >60 mL/min (ref >60)
GFR, Estimated: >60 mL/min (ref >60)
Glucose, Bld: 134 mg/dL — ABNORMAL HIGH (ref 70–99)
Potassium: 4.3 mmol/L (ref 3.5–5.1)
Sodium: 133 mmol/L — ABNORMAL LOW (ref 135–145)
Total Bilirubin: 1.4 mg/dL — ABNORMAL HIGH (ref 0.3–1.2)
Total Protein: 5.6 g/dL — ABNORMAL LOW (ref 6.5–8.1)

## 2019-06-08 LAB — CBC WITH DIFFERENTIAL (CANCER CENTER ONLY)
Abs Immature Granulocytes: 0 10*3/uL (ref 0.00–0.07)
Basophils Absolute: 0 10*3/uL (ref 0.0–0.1)
Basophils Relative: 0 %
Eosinophils Absolute: 0 10*3/uL (ref 0.0–0.5)
Eosinophils Relative: 0 %
HCT: 20.8 % — ABNORMAL LOW (ref 39.0–52.0)
Hemoglobin: 7.1 g/dL — ABNORMAL LOW (ref 13.0–17.0)
Immature Granulocytes: 0 %
Lymphocytes Relative: 36 %
Lymphs Abs: 0.1 10*3/uL — ABNORMAL LOW (ref 0.7–4.0)
MCH: 29.3 pg (ref 26.0–34.0)
MCHC: 34.1 g/dL (ref 30.0–36.0)
MCV: 86 fL (ref 80.0–100.0)
Monocytes Absolute: 0.1 10*3/uL (ref 0.1–1.0)
Monocytes Relative: 16 %
Neutro Abs: 0.2 10*3/uL — CL (ref 1.7–7.7)
Neutrophils Relative %: 48 %
Platelet Count: 30 10*3/uL — ABNORMAL LOW (ref 150–400)
RBC: 2.42 MIL/uL — ABNORMAL LOW (ref 4.22–5.81)
RDW: 14 % (ref 11.5–15.5)
WBC Count: 0.3 10*3/uL — CL (ref 4.0–10.5)
nRBC: 0 % (ref 0.0–0.2)

## 2019-06-08 LAB — SAMPLE TO BLOOD BANK

## 2019-06-08 NOTE — Telephone Encounter (Signed)
Critical Labs reported by Reva in lab.   WBC .3, ANC .2, Hgb 7.1, platelet 30,000.  This was reported to Dr Marin Olp .  No orders received.

## 2019-06-08 NOTE — Progress Notes (Signed)
Patient did not receive any blood products today on 12/30. He will return on 1/2 to [facility] for labs and we will transfuse one unit of PLT and one unit of PRBC that day if he meets parameters in Dr. Libby Maw last office visit note from earlier this month. He will RETURN on 1/4 to [facility] with the SAME blood bank bracelet on and we will draw a new CBC that day to see if he will need anymore blood products that day but per Sherlynn Stalls in blood bank, we will not need another type and screen if he leaves his blood bracelet in tact on himself. Threasa Beards RN, AD aware of this as well as Beth T. RN (Dorsey's RN).

## 2019-06-09 ENCOUNTER — Encounter (HOSPITAL_COMMUNITY): Payer: Self-pay

## 2019-06-09 ENCOUNTER — Other Ambulatory Visit: Payer: Self-pay | Admitting: *Deleted

## 2019-06-09 DIAGNOSIS — D619 Aplastic anemia, unspecified: Secondary | ICD-10-CM

## 2019-06-10 ENCOUNTER — Inpatient Hospital Stay: Payer: Self-pay

## 2019-06-10 LAB — PREPARE RBC (CROSSMATCH)

## 2019-06-11 ENCOUNTER — Inpatient Hospital Stay: Payer: Self-pay

## 2019-06-11 ENCOUNTER — Other Ambulatory Visit: Payer: Self-pay

## 2019-06-11 ENCOUNTER — Inpatient Hospital Stay: Payer: Self-pay | Attending: Hematology and Oncology

## 2019-06-11 ENCOUNTER — Other Ambulatory Visit: Payer: Self-pay | Admitting: Medical

## 2019-06-11 DIAGNOSIS — Z800 Family history of malignant neoplasm of digestive organs: Secondary | ICD-10-CM | POA: Insufficient documentation

## 2019-06-11 DIAGNOSIS — Z7952 Long term (current) use of systemic steroids: Secondary | ICD-10-CM | POA: Insufficient documentation

## 2019-06-11 DIAGNOSIS — R7881 Bacteremia: Secondary | ICD-10-CM | POA: Insufficient documentation

## 2019-06-11 DIAGNOSIS — Z79899 Other long term (current) drug therapy: Secondary | ICD-10-CM | POA: Insufficient documentation

## 2019-06-11 DIAGNOSIS — D619 Aplastic anemia, unspecified: Secondary | ICD-10-CM

## 2019-06-11 DIAGNOSIS — R5383 Other fatigue: Secondary | ICD-10-CM | POA: Insufficient documentation

## 2019-06-11 DIAGNOSIS — R05 Cough: Secondary | ICD-10-CM | POA: Insufficient documentation

## 2019-06-11 DIAGNOSIS — R410 Disorientation, unspecified: Secondary | ICD-10-CM | POA: Insufficient documentation

## 2019-06-11 LAB — CBC WITH DIFFERENTIAL (CANCER CENTER ONLY)
Abs Immature Granulocytes: 0.03 10*3/uL (ref 0.00–0.07)
Basophils Absolute: 0 10*3/uL (ref 0.0–0.1)
Basophils Relative: 0 %
Eosinophils Absolute: 0 10*3/uL (ref 0.0–0.5)
Eosinophils Relative: 0 %
HCT: 22.2 % — ABNORMAL LOW (ref 39.0–52.0)
Hemoglobin: 7.5 g/dL — ABNORMAL LOW (ref 13.0–17.0)
Immature Granulocytes: 10 %
Lymphocytes Relative: 30 %
Lymphs Abs: 0.1 10*3/uL — ABNORMAL LOW (ref 0.7–4.0)
MCH: 30.1 pg (ref 26.0–34.0)
MCHC: 33.8 g/dL (ref 30.0–36.0)
MCV: 89.2 fL (ref 80.0–100.0)
Monocytes Absolute: 0.1 10*3/uL (ref 0.1–1.0)
Monocytes Relative: 17 %
Neutro Abs: 0.1 10*3/uL — ABNORMAL LOW (ref 1.7–7.7)
Neutrophils Relative %: 43 %
Platelet Count: 17 10*3/uL — ABNORMAL LOW (ref 150–400)
RBC: 2.49 MIL/uL — ABNORMAL LOW (ref 4.22–5.81)
RDW: 14.2 % (ref 11.5–15.5)
WBC Count: 0.3 10*3/uL — CL (ref 4.0–10.5)
nRBC: 0 % (ref 0.0–0.2)

## 2019-06-11 MED ORDER — CYCLOSPORINE MODIFIED 50 MG PO CAPS: 100.0000 mg | ORAL_CAPSULE | Freq: Two times a day (BID) | ORAL | 2 refills | Status: DC

## 2019-06-11 MED ORDER — SENNOSIDES-DOCUSATE SODIUM 8.6-50 MG PO TABS
2.00 | ORAL_TABLET | ORAL | Status: DC
Start: 2019-06-10 — End: 2019-06-11

## 2019-06-11 MED ORDER — ACETAMINOPHEN 325 MG PO TABS
ORAL_TABLET | ORAL | Status: AC
  Filled 2019-06-11: qty 2

## 2019-06-11 MED ORDER — ONDANSETRON HCL 8 MG PO TABS
8.00 | ORAL_TABLET | ORAL | Status: DC
Start: ? — End: 2019-06-11

## 2019-06-11 MED ORDER — CYCLOSPORINE MODIFIED (NEORAL) 25 MG PO CAPS
100.00 | ORAL_CAPSULE | ORAL | Status: DC
Start: 2019-06-10 — End: 2019-06-11

## 2019-06-11 MED ORDER — HEPARIN SOD (PORK) LOCK FLUSH 100 UNIT/ML IV SOLN
250.0000 [IU] | INTRAVENOUS | Status: AC | PRN
  Administered 2019-06-11: 12:00:00 250 [IU]
  Filled 2019-06-11: qty 5

## 2019-06-11 MED ORDER — SODIUM CHLORIDE 0.9% IV SOLUTION
250.0000 mL | Freq: Once | INTRAVENOUS | Status: AC
  Administered 2019-06-11: 250 mL via INTRAVENOUS
  Filled 2019-06-11: qty 250

## 2019-06-11 MED ORDER — PANTOPRAZOLE SODIUM 40 MG PO TBEC
40.00 | DELAYED_RELEASE_TABLET | ORAL | Status: DC
Start: 2019-06-11 — End: 2019-06-11

## 2019-06-11 MED ORDER — BENZONATATE 100 MG PO CAPS
100.00 | ORAL_CAPSULE | ORAL | Status: DC
Start: ? — End: 2019-06-11

## 2019-06-11 MED ORDER — ALBUTEROL SULFATE (2.5 MG/3ML) 0.083% IN NEBU
2.50 | INHALATION_SOLUTION | RESPIRATORY_TRACT | Status: DC
Start: ? — End: 2019-06-11

## 2019-06-11 MED ORDER — SODIUM CHLORIDE 0.9% IV SOLUTION
250.0000 mL | Freq: Once | INTRAVENOUS | Status: DC
  Filled 2019-06-11: qty 250

## 2019-06-11 MED ORDER — DAPSONE 100 MG PO TABS: 100.0000 mg | ORAL_TABLET | Freq: Every day | ORAL | 2 refills | Status: DC

## 2019-06-11 MED ORDER — VORICONAZOLE 200 MG PO TABS
200.00 | ORAL_TABLET | ORAL | Status: DC
Start: 2019-06-10 — End: 2019-06-11

## 2019-06-11 MED ORDER — DIPHENHYDRAMINE HCL 25 MG PO CAPS
ORAL_CAPSULE | ORAL | Status: AC
  Filled 2019-06-11: qty 2

## 2019-06-11 MED ORDER — DIPHENHYDRAMINE HCL 25 MG PO CAPS
25.0000 mg | ORAL_CAPSULE | Freq: Once | ORAL | Status: AC
  Administered 2019-06-11: 25 mg via ORAL

## 2019-06-11 MED ORDER — ACYCLOVIR 400 MG PO TABS
400.00 | ORAL_TABLET | ORAL | Status: DC
Start: 2019-06-10 — End: 2019-06-11

## 2019-06-11 MED ORDER — MOXIFLOXACIN HCL 400 MG PO TABS
400.00 | ORAL_TABLET | ORAL | Status: DC
Start: 2019-06-11 — End: 2019-06-11

## 2019-06-11 MED ORDER — SODIUM CHLORIDE 0.9 % IV SOLN
250.00 | INTRAVENOUS | Status: DC
Start: ? — End: 2019-06-11

## 2019-06-11 MED ORDER — LACTATED RINGERS IV SOLN
INTRAVENOUS | Status: DC
Start: ? — End: 2019-06-11

## 2019-06-11 MED ORDER — ACETAMINOPHEN 325 MG PO TABS
650.0000 mg | ORAL_TABLET | Freq: Once | ORAL | Status: AC
  Administered 2019-06-11: 650 mg via ORAL

## 2019-06-11 MED ORDER — DAPSONE 100 MG PO TABS
100.00 | ORAL_TABLET | ORAL | Status: DC
Start: 2019-06-11 — End: 2019-06-11

## 2019-06-11 MED ORDER — SODIUM CHLORIDE 0.9% FLUSH
3.0000 mL | INTRAVENOUS | Status: AC | PRN
  Administered 2019-06-11: 12:00:00 3 mL
  Filled 2019-06-11: qty 10

## 2019-06-11 MED ORDER — LEVOFLOXACIN 500 MG PO TABS: 500.0000 mg | ORAL_TABLET | Freq: Every day | ORAL | 2 refills | Status: AC

## 2019-06-11 MED ORDER — GENERIC EXTERNAL MEDICATION
30.00 | Status: DC
Start: ? — End: 2019-06-11

## 2019-06-11 NOTE — Progress Notes (Signed)
Critical WBC and PLT counts reported to [employer]. Acknowledged, no new orders at this time. Pt to receive 1 unit PRBC as previously ordered.

## 2019-06-11 NOTE — Patient Instructions (Signed)
PICC Home Care Guide  A peripherally inserted central catheter (PICC) is a form of IV access that allows medicines and IV fluids to be quickly distributed throughout the body. The PICC is a long, thin, flexible tube (catheter) that is inserted into a vein in the upper arm. The catheter ends in a large vein in the chest (superior vena cava, or SVC). After the PICC is inserted, a chest X-ray may be done to make sure that it is in the correct place. A PICC may be placed for different reasons, such as:  To give medicines and liquid nutrition.  To give IV fluids and blood products.  If there is trouble placing a peripheral intravenous (PIV) catheter. If taken care of properly, a PICC can remain in place for several months. Having a PICC can also allow a person to go home from the hospital sooner. Medicine and PICC care can be managed at home by a family member, caregiver, or home health care team. What are the risks? Generally, having a PICC is safe. However, problems may occur, including:  A blood clot (thrombus) forming in or at the tip of the PICC.  A blood clot forming in a vein (deep vein thrombosis) or traveling to the lung (pulmonary embolism).  Inflammation of the vein (phlebitis) in which the PICC is placed.  Infection. Central line associated blood stream infection (CLABSI) is a serious infection that often requires hospitalization.  PICC movement (malposition). The PICC tip may move from its original position due to excessive physical activity, forceful coughing, sneezing, or vomiting.  A break or cut in the PICC. It is important not to use scissors near the PICC.  Nerve or tendon irritation or injury during PICC insertion. How to take care of your PICC Preventing problems  You and any caregivers should wash your hands often with soap. Wash hands: ? Before touching the PICC line or the infusion device. ? Before changing a bandage (dressing).  Flush the PICC as told by your  health care provider. Let your health care provider know right away if the PICC is hard to flush or does not flush. Do not use force to flush the PICC.  Do not use a syringe that is less than 10 mL to flush the PICC.  Avoid blood pressure checks on the arm in which the PICC is placed.  Never pull or tug on the PICC.  Do not take the PICC out yourself. Only a trained clinical professional should remove the PICC.  Use clean and sterile supplies only. Keep the supplies in a dry place. Do not reuse needles, syringes, or any other supplies. Doing that can lead to infection.  Keep pets and children away from your PICC line.  Check the PICC insertion site every day for signs of infection. Check for: ? Leakage. ? Redness, swelling, or pain. ? Fluid or blood. ? Warmth. ? Pus or a bad smell. PICC dressing care  Keep your PICC bandage (dressing) clean and dry to prevent infection.  Do not take baths, swim, or use a hot tub until your health care provider approves. Ask your health care provider if you can take showers. You may only be allowed to take sponge baths for bathing. When you are allowed to shower: ? Ask your health care provider to teach you how to wrap the PICC line. ? Cover the PICC line with clear plastic wrap and tape to keep it dry while showering.  Follow instructions from your health care provider   about how to take care of your insertion site and dressing. Make sure you: ? Wash your hands with soap and water before you change your bandage (dressing). If soap and water are not available, use hand sanitizer. ? Change your dressing as told by your health care provider. ? Leave stitches (sutures), skin glue, or adhesive strips in place. These skin closures may need to stay in place for 2 weeks or longer. If adhesive strip edges start to loosen and curl up, you may trim the loose edges. Do not remove adhesive strips completely unless your health care provider tells you to do  that.  Change your PICC dressing if it becomes loose or wet. General instructions   Carry your PICC identification card or wear a medical alert bracelet at all times.  Keep the tube clamped at all times, unless it is being used.  Carry a smooth-edge clamp with you at all times to place on the tube if it breaks.  Do not use scissors or sharp objects near the tube.  You may bend your arm and move it freely. If your PICC is near or at the bend of your elbow, avoid activity with repeated motion at the elbow.  Avoid lifting heavy objects as told by your health care provider.  Keep all follow-up visits as told by your health care provider. This is important. Disposal of supplies  Throw away any syringes in a disposal container that is meant for sharp items (sharps container). You can buy a sharps container from a pharmacy, or you can make one by using an empty hard plastic bottle with a cover.  Place any used dressings or infusion bags into a plastic bag. Throw that bag in the trash. Contact a health care provider if:  You have pain in your arm, ear, face, or teeth.  You have a fever or chills.  You have redness, swelling, or pain around the insertion site.  You have fluid or blood coming from the insertion site.  Your insertion site feels warm to the touch.  You have pus or a bad smell coming from the insertion site.  Your skin feels hard and raised around the insertion site. Get help right away if:  Your PICC is accidentally pulled all the way out. If this happens, cover the insertion site with a bandage or gauze dressing. Do not throw the PICC away. Your health care provider will need to check it.  Your PICC was tugged or pulled and has partially come out. Do not  push the PICC back in.  You cannot flush the PICC, it is hard to flush, or the PICC leaks around the insertion site when it is flushed.  You hear a "flushing" sound when the PICC is flushed.  You feel your  heart racing or skipping beats.  There is a hole or tear in the PICC.  You have swelling in the arm in which the PICC was inserted.  You have a red streak going up your arm from where the PICC was inserted. Summary  A peripherally inserted central catheter (PICC) is a long, thin, flexible tube (catheter) that is inserted into a vein in the upper arm.  The PICC is inserted using a sterile technique by a specially trained nurse or physician. Only a trained clinical professional should remove it.  Keep your PICC identification card with you at all times.  Avoid blood pressure checks on the arm in which the PICC is placed.  If cared for   properly, a PICC can remain in place for several months. Having a PICC can also allow a person to go home from the hospital sooner. This information is not intended to replace advice given to you by your health care provider. Make sure you discuss any questions you have with your health care provider. Document Revised: 05/08/2017 Document Reviewed: 06/28/2016 Elsevier Patient Education  2020 Elsevier Inc.  

## 2019-06-11 NOTE — Patient Instructions (Signed)
Transfusin de sangre en los adultos, cuidados posteriores Blood Transfusion, Adult, Care After Esta hoja le brinda informacin sobre cmo cuidarse despus del procedimiento. El mdico tambin podr darle instrucciones ms especficas. Si tiene problemas o preguntas, llame al mdico. Qu puedo esperar despus del procedimiento? Despus del procedimiento, es normal tener los siguientes sntomas:  Hematomas y dolor en el lugar de la va intravenosa (i.v.).  Fiebre o escalofros el da del procedimiento. Esta puede ser la respuesta del cuerpo a las nuevas clulas sanguneas recibidas.  Dolor de cabeza. Siga estas instrucciones en su casa: Cuidados del lugar de la insercin      Siga las instrucciones del mdico en lo que respecta al cuidado del lugar de insercin. Este es el lugar donde se coloc un tubo (catter) intravenoso en la vena. Asegrese de hacer lo siguiente: ? Lvese las manos con agua y jabn antes y despus de cambiar la venda (vendaje). Use un desinfectante para manos si no dispone de agua y jabn. ? Cambie las vendas como se lo haya indicado el mdico.  Controle el lugar de insercin todos los das para detectar signos de infeccin. Preste atencin a los siguientes signos: ? Dolor, hinchazn o enrojecimiento. ? Sangrado proveniente del lugar. ? Calor. ? Pus o mal olor. Instrucciones generales  Use los medicamentos de venta libre y los recetados solamente como se lo haya indicado el mdico.  Haga reposo como se lo haya indicado el mdico.  Retome sus actividades habituales como se lo haya indicado el mdico.  Concurra a todas las visitas de seguimiento como se lo haya indicado el mdico. Esto es importante. Comunquese con un mdico si:  Tiene picazn o zonas enrojecidas e hinchadas en la piel (urticaria).  Est preocupado o nervioso (ansioso).  Se siente dbil despus de realizar sus actividades habituales.  Tiene enrojecimiento, hinchazn, calor o dolor  alrededor del lugar de la insercin.  Observa sangre que sale del lugar de la insercin que no se detiene al ejercer presin.  Tiene pus o percibe mal olor que proviene del lugar de la insercin. Solicite ayuda de inmediato si:  Tiene signos de una reaccin grave. Puede deberse a una alergia o provenir del sistema de defensa del cuerpo (sistema inmunitario). Algunos signos son los siguientes: ? Dificultad para respirar o falta de aire. ? Hinchazn en la cara o sensacin de calor (sofoco). ? Fiebre o escalofros. ? Dolor de cabeza, de pecho o de espalda. ? Pis (orina) de color oscuro o sangre en el pis. ? Erupcin cutnea generalizada. ? Latidos cardacos acelerados. ? Sentirse mareado o aturdido. Es posible que reciba la transfusin de sangre en un entorno ambulatorio. En tal caso, le indicarn con quin se debe poner en contacto para informar cualquier reaccin. Estos sntomas pueden indicar una emergencia. No espere a ver si los sntomas desaparecen. Solicite atencin mdica de inmediato. Comunquese con el servicio de emergencias de su localidad (911 en los [place]). No conduzca por sus propios medios hasta el hospital. Resumen  Es normal tener moretones y dolor en el lugar donde se coloc la va intravenosa (i.v.).  Controle el lugar de insercin todos los das para detectar signos de infeccin.  Haga reposo como se lo haya indicado el mdico. Retome sus actividades habituales como se lo haya indicado el mdico.  Obtenga ayuda de inmediato si tiene signos de una reaccin grave. Esta informacin no tiene como fin reemplazar el consejo del mdico. Asegrese de hacerle al mdico cualquier pregunta que tenga. Document   Revised: 01/11/2019 Document Reviewed: 01/11/2019 Elsevier Patient Education  2020 Elsevier Inc.  

## 2019-06-12 LAB — PREPARE PLATELET PHERESIS
Unit division: 0
Unit division: 0

## 2019-06-12 LAB — BPAM PLATELET PHERESIS
Blood Product Expiration Date: 202101022359
Blood Product Expiration Date: 202101032359
Unit Type and Rh: 6200
Unit Type and Rh: 6200

## 2019-06-13 ENCOUNTER — Inpatient Hospital Stay: Payer: Self-pay

## 2019-06-13 ENCOUNTER — Other Ambulatory Visit: Payer: Self-pay

## 2019-06-13 ENCOUNTER — Telehealth: Payer: Self-pay | Admitting: *Deleted

## 2019-06-13 ENCOUNTER — Encounter: Payer: Self-pay | Admitting: *Deleted

## 2019-06-13 ENCOUNTER — Other Ambulatory Visit: Payer: Self-pay | Admitting: *Deleted

## 2019-06-13 DIAGNOSIS — D619 Aplastic anemia, unspecified: Secondary | ICD-10-CM

## 2019-06-13 DIAGNOSIS — Z95828 Presence of other vascular implants and grafts: Secondary | ICD-10-CM

## 2019-06-13 LAB — CMP (CANCER CENTER ONLY)
ALT: 88 U/L — ABNORMAL HIGH (ref 0–44)
AST: 55 U/L — ABNORMAL HIGH (ref 15–41)
Albumin: 2.6 g/dL — ABNORMAL LOW (ref 3.5–5.0)
Alkaline Phosphatase: 300 U/L — ABNORMAL HIGH (ref 38–126)
Anion gap: 10 (ref 5–15)
BUN: 39 mg/dL — ABNORMAL HIGH (ref 6–20)
CO2: 23 mmol/L (ref 22–32)
Calcium: 8.3 mg/dL — ABNORMAL LOW (ref 8.9–10.3)
Chloride: 104 mmol/L (ref 98–111)
Creatinine: 1.32 mg/dL — ABNORMAL HIGH (ref 0.61–1.24)
GFR, Est AFR Am: >60 mL/min (ref >60)
GFR, Estimated: 59 mL/min — ABNORMAL LOW (ref >60)
Glucose, Bld: 96 mg/dL (ref 70–99)
Potassium: 4.5 mmol/L (ref 3.5–5.1)
Sodium: 137 mmol/L (ref 135–145)
Total Bilirubin: 0.7 mg/dL (ref 0.3–1.2)
Total Protein: 6 g/dL — ABNORMAL LOW (ref 6.5–8.1)

## 2019-06-13 LAB — CBC WITH DIFFERENTIAL (CANCER CENTER ONLY)
Abs Immature Granulocytes: 0.01 10*3/uL (ref 0.00–0.07)
Basophils Absolute: 0 10*3/uL (ref 0.0–0.1)
Basophils Relative: 0 %
Eosinophils Absolute: 0 10*3/uL (ref 0.0–0.5)
Eosinophils Relative: 0 %
HCT: 23.7 % — ABNORMAL LOW (ref 39.0–52.0)
Hemoglobin: 8.2 g/dL — ABNORMAL LOW (ref 13.0–17.0)
Immature Granulocytes: 2 %
Lymphocytes Relative: 33 %
Lymphs Abs: 0.1 10*3/uL — ABNORMAL LOW (ref 0.7–4.0)
MCH: 30.1 pg (ref 26.0–34.0)
MCHC: 34.6 g/dL (ref 30.0–36.0)
MCV: 87.1 fL (ref 80.0–100.0)
Monocytes Absolute: 0.1 10*3/uL (ref 0.1–1.0)
Monocytes Relative: 21 %
Neutro Abs: 0.2 10*3/uL — CL (ref 1.7–7.7)
Neutrophils Relative %: 44 %
Platelet Count: 5 10*3/uL — CL (ref 150–400)
RBC: 2.72 MIL/uL — ABNORMAL LOW (ref 4.22–5.81)
RDW: 13.6 % (ref 11.5–15.5)
WBC Count: 0.4 10*3/uL — CL (ref 4.0–10.5)
nRBC: 0 % (ref 0.0–0.2)

## 2019-06-13 LAB — TYPE AND SCREEN
ABO/RH(D): O POS
Antibody Screen: NEGATIVE
Unit division: 0

## 2019-06-13 LAB — BPAM RBC
Blood Product Expiration Date: 202101302359
ISSUE DATE / TIME: 202101021006
Unit Type and Rh: 5100

## 2019-06-13 LAB — SAMPLE TO BLOOD BANK

## 2019-06-13 MED ORDER — SODIUM CHLORIDE 0.9% FLUSH
10.0000 mL | INTRAVENOUS | Status: DC | PRN
  Administered 2019-06-13: 14:00:00 10 mL via INTRAVENOUS
  Filled 2019-06-13: qty 10

## 2019-06-13 MED ORDER — SODIUM CHLORIDE 0.9% IV SOLUTION
250.0000 mL | Freq: Once | INTRAVENOUS | Status: AC
  Administered 2019-06-13: 250 mL via INTRAVENOUS
  Filled 2019-06-13: qty 250

## 2019-06-13 MED ORDER — DIPHENHYDRAMINE HCL 25 MG PO CAPS
25.0000 mg | ORAL_CAPSULE | Freq: Once | ORAL | Status: AC
  Administered 2019-06-13: 25 mg via ORAL

## 2019-06-13 MED ORDER — ACETAMINOPHEN 325 MG PO TABS
650.0000 mg | ORAL_TABLET | Freq: Once | ORAL | Status: AC
  Administered 2019-06-13: 650 mg via ORAL

## 2019-06-13 MED ORDER — ACETAMINOPHEN 325 MG PO TABS
ORAL_TABLET | ORAL | Status: AC
  Filled 2019-06-13: qty 2

## 2019-06-13 MED ORDER — SODIUM CHLORIDE 0.9% FLUSH
10.0000 mL | INTRAVENOUS | Status: DC | PRN
  Administered 2019-06-13: 10 mL via INTRAVENOUS
  Filled 2019-06-13: qty 10

## 2019-06-13 MED ORDER — DIPHENHYDRAMINE HCL 25 MG PO CAPS
ORAL_CAPSULE | ORAL | Status: AC
  Filled 2019-06-13: qty 1

## 2019-06-13 MED ORDER — HEPARIN SOD (PORK) LOCK FLUSH 100 UNIT/ML IV SOLN
500.0000 [IU] | Freq: Once | INTRAVENOUS | Status: AC
  Administered 2019-06-13: 14:00:00 500 [IU] via INTRAVENOUS
  Filled 2019-06-13: qty 5

## 2019-06-13 NOTE — Progress Notes (Signed)
Victor Porter  Clinical Social Porter received referral from Therapist, sports for food insecurities.  CSW met with patient in the infusion room with video interpreting to offer support and assess for needs.  Patient stated he is currently living with his brother, but has no income and is experiencing financial stress.  Patient shared documentation with CSW that he had been approved for a 100% discount through [facility] until 09/2019.  He has also applied for disability and medicaid.  Patient stated [facility] had been providing his medications while he was in treatment at that facility.  CSW shared this information with Cytogeneticist.  CSW informed patient of the [facility] and the need to bring a letter of support or proof of income to apply.  Patient stated he did not feel comfortable asking his brother for a letter of support.  CSW provided patient with a food bag from the [facility] food pantry and a gift card to assist with food insecurities.  CSW provided patient with contact information and encouraged patient to contact CSW with questions or concerns.  Johnnye Lana, MSW, LCSW, OSW-C Clinical Social Worker [facility] [phone]

## 2019-06-13 NOTE — Patient Instructions (Signed)
Platelet Transfusion A platelet transfusion is a procedure in which you receive donated platelets through an IV. Platelets are tiny pieces of blood cells. When you get an injury, platelets clump together in the area to form a blood clot. This helps stop bleeding and is the beginning of the healing process. If you have too few platelets, your blood may have trouble clotting. This may cause you to bleed and bruise very easily. You may need a platelet transfusion if you have a condition that causes a low number of platelets (thrombocytopenia). A platelet transfusion may be used to stop or prevent excessive bleeding. Tell a health care provider about:  Any reactions you have had during previous transfusions.  Any allergies you have.  All medicines you are taking, including vitamins, herbs, eye drops, creams, and over-the-counter medicines.  Any blood disorders you have.  Any surgeries you have had.  Any medical conditions you have.  Whether you are pregnant or may be pregnant. What are the risks? Generally, this is a safe procedure. However, problems may occur, including:  Fever.  Infection.  Allergic reaction to the donor platelets.  Your body's disease-fighting system (immune system) attacking the donor platelets (hemolytic reaction). This is rare.  A rare reaction that causes lung damage (transfusion-related acute lung injury). What happens before the procedure? Medicines  Ask your health care provider about: ? Changing or stopping your regular medicines. This is especially important if you are taking diabetes medicines or blood thinners. ? Taking medicines such as aspirin and ibuprofen. These medicines can thin your blood. Do not take these medicines unless your health care provider tells you to take them. ? Taking over-the-counter medicines, vitamins, herbs, and supplements. General instructions  You will have a blood test to determine your blood type. Your blood type  determines what kind of platelets you will be given.  Follow instructions from your health care provider about eating or drinking restrictions.  If you have had an allergic reaction to a transfusion in the past, you may be given medicine to help prevent a reaction.  Your temperature, blood pressure, pulse, and breathing will be monitored. What happens during the procedure?   An IV will be inserted into one of your veins.  For your safety, two health care providers will verify your identity along with the donor platelets about to be infused.  A bag of donor platelets will be connected to your IV. The platelets will flow into your bloodstream. This usually takes 30-60 minutes.  Your temperature, blood pressure, pulse, and breathing will be monitored during the transfusion. This helps detect early signs of any reaction.  You will also be monitored for other symptoms that may indicate a reaction, including chills, hives, or itching.  If you have signs of a reaction at any time, your transfusion will be stopped, and you may be given medicine to help manage the reaction.  When your transfusion is complete, your IV will be removed.  Pressure may be applied to the IV site for a few minutes to stop any bleeding.  The IV site will be covered with a bandage (dressing). The procedure may vary among health care providers and hospitals. What happens after the procedure?  Your blood pressure, temperature, pulse, and breathing will be monitored until you leave the hospital or clinic.  You may have some bruising and soreness at your IV site. Follow these instructions at home: Medicines  Take over-the-counter and prescription medicines only as told by your health care provider.  Talk with your health care provider before you take any medicines that contain aspirin or NSAIDs. These medicines increase your risk for dangerous bleeding. General instructions  Change or remove your dressing as told  by your health care provider.  Return to your normal activities as told by your health care provider. Ask your health care provider what activities are safe for you.  Do not take baths, swim, or use a hot tub until your health care provider approves. Ask your health care provider if you may take showers.  Check your IV site every day for signs of infection. Check for: ? Redness, swelling, or pain. ? Fluid or blood. If fluid or blood drains from your IV site, use your hands to press down firmly on a bandage covering the area for a minute or two. Doing this should stop the bleeding. ? Warmth. ? Pus or a bad smell.  Keep all follow-up visits as told by your health care provider. This is important. Contact a health care provider if you have:  A headache that does not go away with medicine.  Hives, rash, or itchy skin.  Nausea or vomiting.  Unusual tiredness or weakness.  Signs of infection at your IV site. Get help right away if:  You have a fever or chills.  You urinate less often than usual.  Your urine is darker colored than normal.  You have any of the following: ? Trouble breathing. ? Pain in your back, abdomen, or chest. ? Cool, clammy skin. ? A fast heartbeat. Summary  Platelets are tiny pieces of blood cells that clump together to form a blood clot when you have an injury. If you have too few platelets, your blood may have trouble clotting.  A platelet transfusion is a procedure in which you receive donated platelets through an IV.  A platelet transfusion may be used to stop or prevent excessive bleeding.  After the procedure, check your IV site every day for signs of infection, including redness, swelling, pain, or warmth. This information is not intended to replace advice given to you by your health care provider. Make sure you discuss any questions you have with your health care provider. Document Revised: 07/01/2017 Document Reviewed: 07/01/2017 Elsevier  Patient Education  2020 Elsevier Inc.  Coronavirus (COVID-19) Are you at risk?  Are you at risk for the Coronavirus (COVID-19)?  To be considered HIGH RISK for Coronavirus (COVID-19), you have to meet the following criteria:  . Traveled to [place], [place], [place], [place] or [place]; or in the [place] to [place], [place], [place], or [place]; and have fever, cough, and shortness of breath within the last 2 weeks of travel OR . Been in close contact with a person diagnosed with COVID-19 within the last 2 weeks and have fever, cough, and shortness of breath . IF YOU DO NOT MEET THESE CRITERIA, YOU ARE CONSIDERED LOW RISK FOR COVID-19.  What to do if you are HIGH RISK for COVID-19?  Marland Kitchen If you are having a medical emergency, call 911. . Seek medical care right away. Before you go to a doctor's office, urgent care or emergency department, call ahead and tell them about your recent travel, contact with someone diagnosed with COVID-19, and your symptoms. You should receive instructions from your physician's office regarding next steps of care.  . When you arrive at healthcare provider, tell the healthcare staff immediately you have returned from visiting [place], [place], [place], [place] or [place]; or traveled in  the [place] to [place], [place], [place], or [place]; in the last two weeks or you have been in close contact with a person diagnosed with COVID-19 in the last 2 weeks.   . Tell the health care staff about your symptoms: fever, cough and shortness of breath. . After you have been seen by a medical provider, you will be either: o Tested for (COVID-19) and discharged home on quarantine except to seek medical care if symptoms worsen, and asked to  - Stay home and avoid contact with others until you get your results (4-5 days)  - Avoid travel on public transportation if possible (such as bus, train, or airplane) or o Sent to the Emergency Department by EMS for  evaluation, COVID-19 testing, and possible admission depending on your condition and test results.  What to do if you are LOW RISK for COVID-19?  Reduce your risk of any infection by using the same precautions used for avoiding the common cold or flu:  Marland Kitchen Wash your hands often with soap and warm water for at least 20 seconds.  If soap and water are not readily available, use an alcohol-based hand sanitizer with at least 60% alcohol.  . If coughing or sneezing, cover your mouth and nose by coughing or sneezing into the elbow areas of your shirt or coat, into a tissue or into your sleeve (not your hands). . Avoid shaking hands with others and consider head nods or verbal greetings only. . Avoid touching your eyes, nose, or mouth with unwashed hands.  . Avoid close contact with people who are sick. . Avoid places or events with large numbers of people in one location, like concerts or sporting events. . Carefully consider travel plans you have or are making. . If you are planning any travel outside or inside the [place], visit the CDC's Travelers' Health webpage for the latest health notices. . If you have some symptoms but not all symptoms, continue to monitor at home and seek medical attention if your symptoms worsen. . If you are having a medical emergency, call 911.   [place] / e-Visit: [email or web address]         [facility]: [facility]: [phone]                   [facility]: [phone]

## 2019-06-13 NOTE — Telephone Encounter (Signed)
Received call from lab-Pt's HGB is 8.3 and platelets are 5k. WBC is 0.4 Pt is to received 1 unit platelets today.

## 2019-06-14 ENCOUNTER — Other Ambulatory Visit: Payer: Self-pay | Admitting: Hematology and Oncology

## 2019-06-14 LAB — PREPARE PLATELET PHERESIS: Unit division: 0

## 2019-06-14 LAB — BPAM PLATELET PHERESIS
Blood Product Expiration Date: 202101062359
ISSUE DATE / TIME: 202101041224
Unit Type and Rh: 6200

## 2019-06-15 ENCOUNTER — Other Ambulatory Visit: Payer: Self-pay | Admitting: Hematology and Oncology

## 2019-06-15 ENCOUNTER — Encounter: Payer: Self-pay | Admitting: Hematology and Oncology

## 2019-06-15 ENCOUNTER — Inpatient Hospital Stay: Payer: Self-pay

## 2019-06-15 ENCOUNTER — Encounter: Payer: Self-pay | Admitting: General Practice

## 2019-06-15 ENCOUNTER — Other Ambulatory Visit: Payer: Self-pay

## 2019-06-15 DIAGNOSIS — D619 Aplastic anemia, unspecified: Secondary | ICD-10-CM

## 2019-06-15 DIAGNOSIS — Z95828 Presence of other vascular implants and grafts: Secondary | ICD-10-CM

## 2019-06-15 LAB — CBC WITH DIFFERENTIAL (CANCER CENTER ONLY)
Abs Immature Granulocytes: 0 10*3/uL (ref 0.00–0.07)
Basophils Absolute: 0 10*3/uL (ref 0.0–0.1)
Basophils Relative: 0 %
Eosinophils Absolute: 0 10*3/uL (ref 0.0–0.5)
Eosinophils Relative: 0 %
HCT: 21.6 % — ABNORMAL LOW (ref 39.0–52.0)
Hemoglobin: 7.3 g/dL — ABNORMAL LOW (ref 13.0–17.0)
Immature Granulocytes: 0 %
Lymphocytes Relative: 34 %
Lymphs Abs: 0.2 10*3/uL — ABNORMAL LOW (ref 0.7–4.0)
MCH: 29.9 pg (ref 26.0–34.0)
MCHC: 33.8 g/dL (ref 30.0–36.0)
MCV: 88.5 fL (ref 80.0–100.0)
Monocytes Absolute: 0.1 10*3/uL (ref 0.1–1.0)
Monocytes Relative: 23 %
Neutro Abs: 0.2 10*3/uL — CL (ref 1.7–7.7)
Neutrophils Relative %: 43 %
Platelet Count: 15 10*3/uL — ABNORMAL LOW (ref 150–400)
RBC: 2.44 MIL/uL — ABNORMAL LOW (ref 4.22–5.81)
RDW: 13.6 % (ref 11.5–15.5)
WBC Count: 0.4 10*3/uL — CL (ref 4.0–10.5)
nRBC: 0 % (ref 0.0–0.2)

## 2019-06-15 LAB — SAMPLE TO BLOOD BANK

## 2019-06-15 MED ORDER — OMEPRAZOLE 40 MG PO CPDR: 40.0000 mg | DELAYED_RELEASE_CAPSULE | Freq: Every day | ORAL | 3 refills | Status: AC

## 2019-06-15 MED ORDER — MAGNESIUM CHLORIDE 64 MG PO TBEC: 1.0000 | DELAYED_RELEASE_TABLET | Freq: Two times a day (BID) | ORAL | 3 refills | Status: AC

## 2019-06-15 MED ORDER — BENZONATATE 100 MG PO CAPS: 100.0000 mg | ORAL_CAPSULE | Freq: Three times a day (TID) | ORAL | 3 refills | Status: AC

## 2019-06-15 MED ORDER — CYCLOSPORINE MODIFIED 50 MG PO CAPS: 100.0000 mg | ORAL_CAPSULE | Freq: Two times a day (BID) | ORAL | 2 refills | Status: AC

## 2019-06-15 MED ORDER — ACYCLOVIR 400 MG PO TABS: 400.0000 mg | ORAL_TABLET | Freq: Two times a day (BID) | ORAL | 3 refills | Status: AC

## 2019-06-15 MED ORDER — LIDOCAINE VISCOUS HCL 2 % MT SOLN: 30.0000 mL | Freq: Four times a day (QID) | OROMUCOSAL | 3 refills | Status: AC | PRN

## 2019-06-15 MED ORDER — SODIUM CHLORIDE 0.9% FLUSH
10.0000 mL | INTRAVENOUS | Status: DC | PRN
  Administered 2019-06-15: 10 mL via INTRAVENOUS
  Filled 2019-06-15: qty 10

## 2019-06-15 MED ORDER — DAPSONE 100 MG PO TABS: 100.0000 mg | ORAL_TABLET | Freq: Every day | ORAL | 2 refills | Status: AC

## 2019-06-15 MED ORDER — AMLODIPINE BESYLATE 5 MG PO TABS: 5.0000 mg | ORAL_TABLET | Freq: Every morning | ORAL | 3 refills | Status: AC

## 2019-06-15 NOTE — Progress Notes (Signed)
[facility] CSW Progress Notes  Message from [place], Charity fundraiser for [facility] oncologist.  She has spoken w [facility] providers - patient has prescription assistance plan at that facility and has been advised that this is most cost effective way to obtain medications.  Per note from Database administrator, patient also has been approved for [employer] and can use [employer].  Santa Genera, LCSW Clinical Social Worker Phone:  [phone] Cell:  [phone]

## 2019-06-15 NOTE — Progress Notes (Signed)
Pt is approved for the $700 [facility] grant.  °

## 2019-06-15 NOTE — Progress Notes (Signed)
Per charge nurse Amy, patient does not need any transfusions today. Patient made aware of this and he verbalized understanding. Patient ambulated via wheelchair to the lobby awaiting transportation.

## 2019-06-17 ENCOUNTER — Inpatient Hospital Stay: Payer: Self-pay

## 2019-06-17 ENCOUNTER — Other Ambulatory Visit: Payer: Self-pay | Admitting: General Practice

## 2019-06-18 ENCOUNTER — Other Ambulatory Visit: Payer: Self-pay

## 2019-06-18 ENCOUNTER — Inpatient Hospital Stay: Payer: Self-pay

## 2019-06-18 DIAGNOSIS — D696 Thrombocytopenia, unspecified: Secondary | ICD-10-CM

## 2019-06-18 DIAGNOSIS — D619 Aplastic anemia, unspecified: Secondary | ICD-10-CM

## 2019-06-18 LAB — CBC
HCT: 20.5 % — ABNORMAL LOW (ref 39.0–52.0)
Hemoglobin: 7 g/dL — ABNORMAL LOW (ref 13.0–17.0)
MCH: 30.4 pg (ref 26.0–34.0)
MCHC: 34.1 g/dL (ref 30.0–36.0)
MCV: 89.1 fL (ref 80.0–100.0)
Platelets: 3 10*3/uL — CL (ref 150–400)
RBC: 2.3 MIL/uL — ABNORMAL LOW (ref 4.22–5.81)
RDW: 13.8 % (ref 11.5–15.5)
WBC: 0.7 10*3/uL — CL (ref 4.0–10.5)
nRBC: 0 % (ref 0.0–0.2)

## 2019-06-18 MED ORDER — DIPHENHYDRAMINE HCL 25 MG PO CAPS
25.0000 mg | ORAL_CAPSULE | Freq: Once | ORAL | Status: AC
  Administered 2019-06-18: 09:00:00 25 mg via ORAL

## 2019-06-18 MED ORDER — HEPARIN SOD (PORK) LOCK FLUSH 100 UNIT/ML IV SOLN
250.0000 [IU] | INTRAVENOUS | Status: DC | PRN
  Filled 2019-06-18: qty 5

## 2019-06-18 MED ORDER — SODIUM CHLORIDE 0.9% IV SOLUTION
250.0000 mL | Freq: Once | INTRAVENOUS | Status: AC
  Administered 2019-06-18: 250 mL via INTRAVENOUS
  Filled 2019-06-18: qty 250

## 2019-06-18 MED ORDER — ACETAMINOPHEN 325 MG PO TABS
ORAL_TABLET | ORAL | Status: AC
  Filled 2019-06-18: qty 2

## 2019-06-18 MED ORDER — HEPARIN SOD (PORK) LOCK FLUSH 100 UNIT/ML IV SOLN
250.0000 [IU] | INTRAVENOUS | Status: AC | PRN
  Administered 2019-06-18: 10:00:00 250 [IU]
  Filled 2019-06-18: qty 5

## 2019-06-18 MED ORDER — DIPHENHYDRAMINE HCL 25 MG PO CAPS
ORAL_CAPSULE | ORAL | Status: AC
  Filled 2019-06-18: qty 1

## 2019-06-18 MED ORDER — SODIUM CHLORIDE 0.9% IV SOLUTION
250.0000 mL | Freq: Once | INTRAVENOUS | Status: DC
  Filled 2019-06-18: qty 250

## 2019-06-18 MED ORDER — SODIUM CHLORIDE 0.9% FLUSH
10.0000 mL | INTRAVENOUS | Status: DC | PRN
  Filled 2019-06-18: qty 10

## 2019-06-18 MED ORDER — SODIUM CHLORIDE 0.9% FLUSH
10.0000 mL | Freq: Once | INTRAVENOUS | Status: AC
  Administered 2019-06-18: 10 mL via INTRAVENOUS
  Filled 2019-06-18: qty 10

## 2019-06-18 MED ORDER — ACETAMINOPHEN 325 MG PO TABS
650.0000 mg | ORAL_TABLET | Freq: Once | ORAL | Status: AC
  Administered 2019-06-18: 650 mg via ORAL

## 2019-06-18 NOTE — Progress Notes (Signed)
Platelets level of 3. Per verbal orders received 06/17/19, patient to get 1 unit platelets. Orders entered.

## 2019-06-18 NOTE — Patient Instructions (Signed)
Platelet Count Test Why am I having this test? Platelets are specialized cells that help the blood clot. When you get a tissue injury like a cut, platelets gather at the site of the injury to stop the bleeding. You may have a platelet count test:  If you have symptoms that may be related to excess bleeding or delayed blood clotting, such as: ? A rash of pinprick-sized red and purple dots on the skin (petechiae). These are small collections of blood (hemorrhages) in the skin. ? Heavy menstrual bleeding.  To help monitor treatment for: ? Thrombocytopenia. This is a condition in which you have a low platelet count. ? Bone marrow failure. What is being tested? This test measures how many platelets you have within a specific amount (volume) of blood. What kind of sample is taken?  A blood sample is required for this test. It is usually collected by inserting a needle into a blood vessel or by sticking a finger with a small needle. Tell a health care provider about:  Any allergies you have.  All medicines you are taking, including vitamins, herbs, eye drops, creams, and over-the-counter medicines.  Any blood disorders you have.  Any surgeries you have had.  Any medical conditions you have.  Whether you are pregnant or may be pregnant. How are the results reported? Your test results will be reported as a value that indicates how many platelets are in the blood volume. This will be given as platelets per cubic millimeter (mm3) of blood. Your health care provider will compare your results to normal ranges that were established after testing a large group of people (reference ranges). Reference ranges may vary among labs and hospitals. For this test, common reference ranges are:  Adult or elderly: 150,000-400,000/mm3.  Child: 150,000-400,000/mm3.  Infant: 200,000-475,000/mm3.  Premature infant: 100,000-300,000/mm3.  Newborn: 150,000-300,000/mm3. What do the results mean? A result  that is within your reference range is considered normal, meaning that you have a normal amount of platelets in your blood. A result that is higher than your reference range means that you have too many platelets in your blood. This may mean that you have:  Certain types of cancer, such as leukemia or lymphoma.  A condition in which the bone marrow produces excess amounts of all cell types, including platelets (polycythemia vera).  A condition that can occur after surgery to remove the spleen (post-splenectomy syndrome).  Rheumatoid arthritis.  Anemia due to lack of iron (iron-deficiency anemia). A result that is lower than your reference range means that you have too few platelets in your blood. This may mean that you have:  A condition in which the spleen breaks down platelets faster than normal (hypersplenism).  A hemorrhage somewhere in your body.  Low platelet count due to your body's disease-fighting system attacking your platelets (immune thrombocytopenia).  Cancer. Chemotherapy treatments for cancers such as leukemia can also cause low platelet count.  A rare, serious form of thrombocytopenia that causes blood clots (thrombotic thrombocytopenia).  HELLP syndrome, a disorder of pregnancy that causes high blood pressure and other serious problems.  Certain disorders that are passed from parent to child (inherited) that cause a low platelet count.  A condition in which the proteins that control blood clotting are overactive, causing abnormal clotting processes to occur (disseminated intravascular coagulation, DIC).  A disease that causes long-term inflammation and pain in many parts of the body (systemic lupus erythematosus, SLE).  Certain types of anemia, such as pernicious anemia or hemolytic anemia.    Infection. Talk with your health care provider about what your results mean. Questions to ask your health care provider Ask your health care provider, or the department that  is doing the test:  When will my results be ready?  How will I get my results?  What are my treatment options?  What other tests do I need?  What are my next steps? Summary  Platelets are specialized cells that help the blood clot. When you get a tissue injury like a cut, platelets gather at the site of the injury to stop the bleeding.  This test measures how many platelets you have within a specific amount (volume) of blood.  Talk with your health care provider about what your results mean. This information is not intended to replace advice given to you by your health care provider. Make sure you discuss any questions you have with your health care provider. Document Revised: 02/16/2017 Document Reviewed: 02/16/2017 Elsevier Patient Education  2020 Elsevier Inc.  

## 2019-06-20 ENCOUNTER — Other Ambulatory Visit: Payer: Self-pay | Admitting: *Deleted

## 2019-06-20 ENCOUNTER — Other Ambulatory Visit: Payer: Self-pay

## 2019-06-20 ENCOUNTER — Inpatient Hospital Stay: Payer: Self-pay

## 2019-06-20 ENCOUNTER — Encounter: Payer: Self-pay | Admitting: *Deleted

## 2019-06-20 DIAGNOSIS — D619 Aplastic anemia, unspecified: Secondary | ICD-10-CM

## 2019-06-20 DIAGNOSIS — Z95828 Presence of other vascular implants and grafts: Secondary | ICD-10-CM

## 2019-06-20 DIAGNOSIS — D696 Thrombocytopenia, unspecified: Secondary | ICD-10-CM

## 2019-06-20 LAB — CBC WITH DIFFERENTIAL (CANCER CENTER ONLY)
Abs Immature Granulocytes: 0.01 K/uL (ref 0.00–0.07)
Basophils Absolute: 0 K/uL (ref 0.0–0.1)
Basophils Relative: 0 %
Eosinophils Absolute: 0 K/uL (ref 0.0–0.5)
Eosinophils Relative: 0 %
HCT: 18.9 % — ABNORMAL LOW (ref 39.0–52.0)
Hemoglobin: 6.4 g/dL — CL (ref 13.0–17.0)
Immature Granulocytes: 2 %
Lymphocytes Relative: 24 %
Lymphs Abs: 0.2 K/uL — ABNORMAL LOW (ref 0.7–4.0)
MCH: 30.3 pg (ref 26.0–34.0)
MCHC: 33.9 g/dL (ref 30.0–36.0)
MCV: 89.6 fL (ref 80.0–100.0)
Monocytes Absolute: 0.2 K/uL (ref 0.1–1.0)
Monocytes Relative: 24 %
Neutro Abs: 0.4 K/uL — CL (ref 1.7–7.7)
Neutrophils Relative %: 50 %
Platelet Count: 11 K/uL — ABNORMAL LOW (ref 150–400)
RBC: 2.11 MIL/uL — ABNORMAL LOW (ref 4.22–5.81)
RDW: 13.4 % (ref 11.5–15.5)
WBC Count: 0.7 K/uL — CL (ref 4.0–10.5)
nRBC: 0 % (ref 0.0–0.2)

## 2019-06-20 LAB — BPAM PLATELET PHERESIS
Blood Product Expiration Date: 202101092359
ISSUE DATE / TIME: 202101090927
Unit Type and Rh: 6200

## 2019-06-20 LAB — SAMPLE TO BLOOD BANK

## 2019-06-20 LAB — PREPARE PLATELET PHERESIS: Unit division: 0

## 2019-06-20 LAB — PREPARE RBC (CROSSMATCH)

## 2019-06-20 MED ORDER — ACETAMINOPHEN 325 MG PO TABS
650.0000 mg | ORAL_TABLET | Freq: Once | ORAL | Status: AC
  Administered 2019-06-20: 650 mg via ORAL

## 2019-06-20 MED ORDER — SODIUM CHLORIDE 0.9% FLUSH
3.0000 mL | INTRAVENOUS | Status: AC | PRN
  Administered 2019-06-20: 15:00:00 3 mL
  Filled 2019-06-20: qty 10

## 2019-06-20 MED ORDER — SODIUM CHLORIDE 0.9% IV SOLUTION
250.0000 mL | Freq: Once | INTRAVENOUS | Status: AC
  Administered 2019-06-20: 12:00:00 250 mL via INTRAVENOUS
  Filled 2019-06-20: qty 250

## 2019-06-20 MED ORDER — DIPHENHYDRAMINE HCL 25 MG PO CAPS
25.0000 mg | ORAL_CAPSULE | Freq: Once | ORAL | Status: AC
  Administered 2019-06-20: 25 mg via ORAL

## 2019-06-20 MED ORDER — HEPARIN SOD (PORK) LOCK FLUSH 100 UNIT/ML IV SOLN
250.0000 [IU] | INTRAVENOUS | Status: AC | PRN
  Administered 2019-06-20: 250 [IU]
  Filled 2019-06-20: qty 5

## 2019-06-20 MED ORDER — ACETAMINOPHEN 325 MG PO TABS
ORAL_TABLET | ORAL | Status: AC
  Filled 2019-06-20: qty 2

## 2019-06-20 MED ORDER — SODIUM CHLORIDE 0.9% FLUSH
10.0000 mL | Freq: Once | INTRAVENOUS | Status: AC
  Administered 2019-06-20: 11:00:00 10 mL
  Filled 2019-06-20: qty 10

## 2019-06-20 MED ORDER — DIPHENHYDRAMINE HCL 25 MG PO CAPS
ORAL_CAPSULE | ORAL | Status: AC
  Filled 2019-06-20: qty 2

## 2019-06-20 NOTE — Patient Instructions (Signed)
Transfusin de sangre en los adultos, cuidados posteriores Blood Transfusion, Adult, Care After Esta hoja le brinda informacin sobre cmo cuidarse despus del procedimiento. El mdico tambin podr darle instrucciones ms especficas. Si tiene problemas o preguntas, llame al mdico. Qu puedo esperar despus del procedimiento? Despus del procedimiento, es normal tener los siguientes sntomas:  Hematomas y dolor en el lugar de la va intravenosa (i.v.).  Fiebre o escalofros el da del procedimiento. Esta puede ser la respuesta del cuerpo a las nuevas clulas sanguneas recibidas.  Dolor de cabeza. Siga estas instrucciones en su casa: Cuidados del lugar de la insercin      Siga las instrucciones del mdico en lo que respecta al cuidado del lugar de insercin. Este es el lugar donde se coloc un tubo (catter) intravenoso en la vena. Asegrese de hacer lo siguiente: ? Lvese las manos con agua y jabn antes y despus de cambiar la venda (vendaje). Use un desinfectante para manos si no dispone de agua y jabn. ? Cambie las vendas como se lo haya indicado el mdico.  Controle el lugar de insercin todos los das para detectar signos de infeccin. Preste atencin a los siguientes signos: ? Dolor, hinchazn o enrojecimiento. ? Sangrado proveniente del lugar. ? Calor. ? Pus o mal olor. Instrucciones generales  Use los medicamentos de venta libre y los recetados solamente como se lo haya indicado el mdico.  Haga reposo como se lo haya indicado el mdico.  Retome sus actividades habituales como se lo haya indicado el mdico.  Concurra a todas las visitas de seguimiento como se lo haya indicado el mdico. Esto es importante. Comunquese con un mdico si:  Tiene picazn o zonas enrojecidas e hinchadas en la piel (urticaria).  Est preocupado o nervioso (ansioso).  Se siente dbil despus de realizar sus actividades habituales.  Tiene enrojecimiento, hinchazn, calor o dolor  alrededor del lugar de la insercin.  Observa sangre que sale del lugar de la insercin que no se detiene al ejercer presin.  Tiene pus o percibe mal olor que proviene del lugar de la insercin. Solicite ayuda de inmediato si:  Tiene signos de una reaccin grave. Puede deberse a una alergia o provenir del sistema de defensa del cuerpo (sistema inmunitario). Algunos signos son los siguientes: ? Dificultad para respirar o falta de aire. ? Hinchazn en la cara o sensacin de calor (sofoco). ? Fiebre o escalofros. ? Dolor de cabeza, de pecho o de espalda. ? Pis (orina) de color oscuro o sangre en el pis. ? Erupcin cutnea generalizada. ? Latidos cardacos acelerados. ? Sentirse mareado o aturdido. Es posible que reciba la transfusin de sangre en un entorno ambulatorio. En tal caso, le indicarn con quin se debe poner en contacto para informar cualquier reaccin. Estos sntomas pueden indicar una emergencia. No espere a ver si los sntomas desaparecen. Solicite atencin mdica de inmediato. Comunquese con el servicio de emergencias de su localidad (911 en los [place]). No conduzca por sus propios medios hasta el hospital. Resumen  Es normal tener moretones y dolor en el lugar donde se coloc la va intravenosa (i.v.).  Controle el lugar de insercin todos los das para detectar signos de infeccin.  Haga reposo como se lo haya indicado el mdico. Retome sus actividades habituales como se lo haya indicado el mdico.  Obtenga ayuda de inmediato si tiene signos de una reaccin grave. Esta informacin no tiene como fin reemplazar el consejo del mdico. Asegrese de hacerle al mdico cualquier pregunta que tenga. Document   Revised: 01/11/2019 Document Reviewed: 01/11/2019 Elsevier Patient Education  2020 Elsevier Inc.  

## 2019-06-21 ENCOUNTER — Encounter: Payer: Self-pay | Admitting: General Practice

## 2019-06-21 LAB — TYPE AND SCREEN
ABO/RH(D): O POS
Antibody Screen: NEGATIVE
Unit division: 0

## 2019-06-21 LAB — BPAM RBC
Blood Product Expiration Date: 202102102359
ISSUE DATE / TIME: 202101111334
Unit Type and Rh: 5100

## 2019-06-21 NOTE — Progress Notes (Signed)
[place] Work  Clinical Social Work met with patient in infusion, had difficulty communicating without interpreter.  CSW discussed case with RN and spanish interpreter, Lavon Paganini.  Patient's interpreter stated patient is aware he needs to obtain medications and receive assistance for medications through [facility].  Patient's medications are being prescribed by provider at [facility].     Gwinda Maine, LCSW  Clinical Social Worker [facility]

## 2019-06-21 NOTE — Progress Notes (Signed)
[facility] CSW Progress Notes  Patient has applied for Emergency Medicaid w [place] [employer] (worker Henreitta Cea [phone]).  Spoke w worker - this form of Medicaid will only cover emergency hospitalization charges and only with a certain time frame.  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  Outpatient care and medications will not be covered under this form of Medicaid.  Santa Genera, LCSW Clinical Social Worker Phone:  [phone]

## 2019-06-22 ENCOUNTER — Other Ambulatory Visit: Payer: Self-pay

## 2019-06-22 ENCOUNTER — Inpatient Hospital Stay: Payer: Self-pay

## 2019-06-22 ENCOUNTER — Other Ambulatory Visit: Payer: Self-pay | Admitting: *Deleted

## 2019-06-22 DIAGNOSIS — D619 Aplastic anemia, unspecified: Secondary | ICD-10-CM

## 2019-06-22 LAB — CBC WITH DIFFERENTIAL (CANCER CENTER ONLY)
Abs Immature Granulocytes: 0.01 10*3/uL (ref 0.00–0.07)
Basophils Absolute: 0 10*3/uL (ref 0.0–0.1)
Basophils Relative: 0 %
Eosinophils Absolute: 0 10*3/uL (ref 0.0–0.5)
Eosinophils Relative: 0 %
HCT: 20.8 % — ABNORMAL LOW (ref 39.0–52.0)
Hemoglobin: 7 g/dL — ABNORMAL LOW (ref 13.0–17.0)
Immature Granulocytes: 1 %
Lymphocytes Relative: 27 %
Lymphs Abs: 0.3 10*3/uL — ABNORMAL LOW (ref 0.7–4.0)
MCH: 28.9 pg (ref 26.0–34.0)
MCHC: 33.7 g/dL (ref 30.0–36.0)
MCV: 86 fL (ref 80.0–100.0)
Monocytes Absolute: 0.2 10*3/uL (ref 0.1–1.0)
Monocytes Relative: 25 %
Neutro Abs: 0.5 10*3/uL — ABNORMAL LOW (ref 1.7–7.7)
Neutrophils Relative %: 47 %
Platelet Count: <5 10*3/uL — CL (ref 150–400)
RBC: 2.42 MIL/uL — ABNORMAL LOW (ref 4.22–5.81)
RDW: 13.8 % (ref 11.5–15.5)
WBC Count: 1 10*3/uL — ABNORMAL LOW (ref 4.0–10.5)
nRBC: 0 % (ref 0.0–0.2)

## 2019-06-22 LAB — SAMPLE TO BLOOD BANK

## 2019-06-22 LAB — PREPARE RBC (CROSSMATCH)

## 2019-06-22 MED ORDER — DIPHENHYDRAMINE HCL 25 MG PO CAPS
ORAL_CAPSULE | ORAL | Status: AC
  Filled 2019-06-22: qty 1

## 2019-06-22 MED ORDER — ACETAMINOPHEN 325 MG PO TABS
ORAL_TABLET | ORAL | Status: AC
  Filled 2019-06-22: qty 2

## 2019-06-22 MED ORDER — SODIUM CHLORIDE 0.9% FLUSH
3.0000 mL | INTRAVENOUS | Status: AC | PRN
  Administered 2019-06-22: 3 mL
  Filled 2019-06-22: qty 10

## 2019-06-22 MED ORDER — HEPARIN SOD (PORK) LOCK FLUSH 100 UNIT/ML IV SOLN
250.0000 [IU] | INTRAVENOUS | Status: AC | PRN
  Administered 2019-06-22: 250 [IU]
  Filled 2019-06-22: qty 5

## 2019-06-22 MED ORDER — SODIUM CHLORIDE 0.9% IV SOLUTION
250.0000 mL | Freq: Once | INTRAVENOUS | Status: AC
  Administered 2019-06-22: 250 mL via INTRAVENOUS
  Filled 2019-06-22: qty 250

## 2019-06-22 MED ORDER — ACETAMINOPHEN 325 MG PO TABS
650.0000 mg | ORAL_TABLET | Freq: Once | ORAL | Status: AC
  Administered 2019-06-22: 650 mg via ORAL

## 2019-06-22 MED ORDER — DIPHENHYDRAMINE HCL 25 MG PO CAPS
25.0000 mg | ORAL_CAPSULE | Freq: Once | ORAL | Status: AC
  Administered 2019-06-22: 25 mg via ORAL

## 2019-06-22 NOTE — Patient Instructions (Signed)
Transfusin de sangre en los adultos, cuidados posteriores Blood Transfusion, Adult, Care After Esta hoja le brinda informacin sobre cmo cuidarse despus del procedimiento. El mdico tambin podr darle instrucciones ms especficas. Si tiene problemas o preguntas, llame al mdico. Qu puedo esperar despus del procedimiento? Despus del procedimiento, es normal tener los siguientes sntomas:  Hematomas y dolor en el lugar de la va intravenosa (i.v.).  Fiebre o escalofros el da del procedimiento. Esta puede ser la respuesta del cuerpo a las nuevas clulas sanguneas recibidas.  Dolor de cabeza. Siga estas instrucciones en su casa: Cuidados del lugar de la insercin      Siga las instrucciones del mdico en lo que respecta al cuidado del lugar de insercin. Este es el lugar donde se coloc un tubo (catter) intravenoso en la vena. Asegrese de hacer lo siguiente: ? Lvese las manos con agua y jabn antes y despus de cambiar la venda (vendaje). Use un desinfectante para manos si no dispone de agua y jabn. ? Cambie las vendas como se lo haya indicado el mdico.  Controle el lugar de insercin todos los das para detectar signos de infeccin. Preste atencin a los siguientes signos: ? Dolor, hinchazn o enrojecimiento. ? Sangrado proveniente del lugar. ? Calor. ? Pus o mal olor. Instrucciones generales  Use los medicamentos de venta libre y los recetados solamente como se lo haya indicado el mdico.  Haga reposo como se lo haya indicado el mdico.  Retome sus actividades habituales como se lo haya indicado el mdico.  Concurra a todas las visitas de seguimiento como se lo haya indicado el mdico. Esto es importante. Comunquese con un mdico si:  Tiene picazn o zonas enrojecidas e hinchadas en la piel (urticaria).  Est preocupado o nervioso (ansioso).  Se siente dbil despus de realizar sus actividades habituales.  Tiene enrojecimiento, hinchazn, calor o dolor  alrededor del lugar de la insercin.  Observa sangre que sale del lugar de la insercin que no se detiene al ejercer presin.  Tiene pus o percibe mal olor que proviene del lugar de la insercin. Solicite ayuda de inmediato si:  Tiene signos de una reaccin grave. Puede deberse a una alergia o provenir del sistema de defensa del cuerpo (sistema inmunitario). Algunos signos son los siguientes: ? Dificultad para respirar o falta de aire. ? Hinchazn en la cara o sensacin de calor (sofoco). ? Fiebre o escalofros. ? Dolor de cabeza, de pecho o de espalda. ? Pis (orina) de color oscuro o sangre en el pis. ? Erupcin cutnea generalizada. ? Latidos cardacos acelerados. ? Sentirse mareado o aturdido. Es posible que reciba la transfusin de sangre en un entorno ambulatorio. En tal caso, le indicarn con quin se debe poner en contacto para informar cualquier reaccin. Estos sntomas pueden indicar una emergencia. No espere a ver si los sntomas desaparecen. Solicite atencin mdica de inmediato. Comunquese con el servicio de emergencias de su localidad (911 en los [place]). No conduzca por sus propios medios hasta el hospital. Resumen  Es normal tener moretones y dolor en el lugar donde se coloc la va intravenosa (i.v.).  Controle el lugar de insercin todos los das para detectar signos de infeccin.  Haga reposo como se lo haya indicado el mdico. Retome sus actividades habituales como se lo haya indicado el mdico.  Obtenga ayuda de inmediato si tiene signos de una reaccin grave. Esta informacin no tiene como fin reemplazar el consejo del mdico. Asegrese de hacerle al mdico cualquier pregunta que tenga. Document   Revised: 01/11/2019 Document Reviewed: 01/11/2019 Elsevier Patient Education  2020 Elsevier Inc.  

## 2019-06-23 LAB — BPAM RBC
Blood Product Expiration Date: 202102102359
ISSUE DATE / TIME: 202101131327
Unit Type and Rh: 5100

## 2019-06-23 LAB — PREPARE PLATELET PHERESIS: Unit division: 0

## 2019-06-23 LAB — TYPE AND SCREEN
ABO/RH(D): O POS
Antibody Screen: NEGATIVE
Unit division: 0

## 2019-06-23 LAB — BPAM PLATELET PHERESIS
Blood Product Expiration Date: 202101132359
ISSUE DATE / TIME: 202101131331
Unit Type and Rh: 600

## 2019-06-24 ENCOUNTER — Inpatient Hospital Stay: Payer: Self-pay

## 2019-06-24 ENCOUNTER — Encounter: Payer: Self-pay | Admitting: Hematology and Oncology

## 2019-06-24 ENCOUNTER — Inpatient Hospital Stay (HOSPITAL_BASED_OUTPATIENT_CLINIC_OR_DEPARTMENT_OTHER): Payer: Self-pay | Admitting: Hematology and Oncology

## 2019-06-24 ENCOUNTER — Other Ambulatory Visit: Payer: Self-pay

## 2019-06-24 VITALS — BP 127/88 | HR 91 | Temp 99.1°F | Resp 17 | Ht 70.0 in | Wt 159.6 lb

## 2019-06-24 DIAGNOSIS — D619 Aplastic anemia, unspecified: Secondary | ICD-10-CM

## 2019-06-24 DIAGNOSIS — D61818 Other pancytopenia: Secondary | ICD-10-CM

## 2019-06-24 LAB — CBC WITH DIFFERENTIAL (CANCER CENTER ONLY)
Abs Immature Granulocytes: 0.01 10*3/uL (ref 0.00–0.07)
Basophils Absolute: 0 10*3/uL (ref 0.0–0.1)
Basophils Relative: 0 %
Eosinophils Absolute: 0 10*3/uL (ref 0.0–0.5)
Eosinophils Relative: 0 %
HCT: 22.7 % — ABNORMAL LOW (ref 39.0–52.0)
Hemoglobin: 7.7 g/dL — ABNORMAL LOW (ref 13.0–17.0)
Immature Granulocytes: 1 %
Lymphocytes Relative: 31 %
Lymphs Abs: 0.3 10*3/uL — ABNORMAL LOW (ref 0.7–4.0)
MCH: 27.7 pg (ref 26.0–34.0)
MCHC: 33.9 g/dL (ref 30.0–36.0)
MCV: 81.7 fL (ref 80.0–100.0)
Monocytes Absolute: 0.2 10*3/uL (ref 0.1–1.0)
Monocytes Relative: 22 %
Neutro Abs: 0.5 10*3/uL — ABNORMAL LOW (ref 1.7–7.7)
Neutrophils Relative %: 46 %
Platelet Count: 16 10*3/uL — ABNORMAL LOW (ref 150–400)
RBC: 2.78 MIL/uL — ABNORMAL LOW (ref 4.22–5.81)
RDW: 18.6 % — ABNORMAL HIGH (ref 11.5–15.5)
WBC Count: 1.1 10*3/uL — ABNORMAL LOW (ref 4.0–10.5)
nRBC: 0 % (ref 0.0–0.2)

## 2019-06-24 LAB — SAMPLE TO BLOOD BANK

## 2019-06-24 NOTE — Progress Notes (Signed)
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 No significant dysplasia noted.  3) 12/09/2018: Bmbx repeated. [facility] myeloid panel showed no mutations. 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 Diagnosed with Aplastic anemia. 4) 01/10/2019-01/16/2019: [facility] h-ATG x 4 days with prednisone, cyclosporine, and promacta.  5) [facility]10/2019: [facility] R sided lower tooth and jaw pain. No jaw infection noted during dental consult. 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  7) 12/4-12/01/2019: received salvage rabbit ATG.  8) 05/23/2019: Establish care with Dr. Leonides Schanzorsey for co-managed local care.   Interval History:  Victor Porter 59 y.o. male with medical history significant for aplastic anemia presents for a follow up visit. The patient's last visit was on 05/27/2019. In the interim since the last visit the patient has been seen in clinic every other day and has received PRBC/platelet transfusions nearly every visit.    On exam today [name] notes that he feels well.  He notes that with the blood transfusions he has had an increase in his energy and that his appetite is excellent.  He states that he has had no trouble eating and that he believes he has gained weight.  He reports that he ambulates some, but always with the assistance of a family member.  He notes that he is able to walk half a block and back, but he always has the support of family member.  He denies having any other  concerning symptoms.  He reports no issues of bleeding or bruising, and denies any dark stools.  He reports that he does have a residual cough, but that is nonproductive.  He also notes that he has been 3 days without his medication due to some confusion as to where and how he was to pick up his medications.  He is currently scheduled to have his medications picked up today, with [facility] assistance [facility].  A full 10 point ROS is listed below  Fortunately at today's visit the patient's wife was also present and was able to ask questions.  We discussed the prognosis of his disease and the difficulties with further lines of treatment if rATG fails to improve his condition.  The patient and his wife voiced their understanding of the limited options and the poor prognosis at this time.  Furthermore the patient and his wife noted that they would consider moving to [place] to be with their son.  They noted this was something they were considering and wanted to know how feasible it would be to have their care transferred.  I noted that we would be able to get his care transition to a local cancer center if the need arose.  MEDICAL HISTORY:  Past Medical History:  Diagnosis Date  . Aplastic anemia (HCC)     SURGICAL HISTORY: No past surgical history on file.  SOCIAL HISTORY: Social History   Socioeconomic History  . Marital status: Married    Spouse name: Not on file  . Number of children: Not on file  . Years of education: Not on file  .  Highest education level: Not on file  Occupational History  . Not on file  Tobacco Use  . Smoking status: Never Smoker  . Smokeless tobacco: Never Used  Substance and Sexual Activity  . Alcohol use: Never  . Drug use: Never  . Sexual activity: Not on file  Other Topics Concern  . Not on file  Social History Narrative  . Not on file   Social Determinants of Health   Financial Resource Strain:   . Difficulty of  Paying Living Expenses: Not on file  Food Insecurity:   . Worried About Charity fundraiser in the Last Year: Not on file  . Ran Out of Food in the Last Year: Not on file  Transportation Needs:   . Lack of Transportation (Medical): Not on file  . Lack of Transportation (Non-Medical): Not on file  Physical Activity:   . Days of Exercise per Week: Not on file  . Minutes of Exercise per Session: Not on file  Stress:   . Feeling of Stress : Not on file  Social Connections:   . Frequency of Communication with Friends and Family: Not on file  . Frequency of Social Gatherings with Friends and Family: Not on file  . Attends Religious Services: Not on file  . Active Member of Clubs or Organizations: Not on file  . Attends Archivist Meetings: Not on file  . Marital Status: Not on file  Intimate Partner Violence:   . Fear of Current or Ex-Partner: Not on file  . Emotionally Abused: Not on file  . Physically Abused: Not on file  . Sexually Abused: Not on file    FAMILY HISTORY: Family History  Problem Relation Age of Onset  . Stomach cancer Father     ALLERGIES:  is allergic to grapefruit extract and tamarind extract.  MEDICATIONS:  Current Outpatient Medications  Medication Sig Dispense Refill  . acyclovir (ZOVIRAX) 400 MG tablet Take 1 tablet (400 mg total) by mouth 2 (two) times daily. 60 tablet 3  . albuterol (VENTOLIN HFA) 108 (90 Base) MCG/ACT inhaler Inhale 2 puffs into the lungs 2 (two) times daily as needed.    Marland Kitchen amLODipine (NORVASC) 5 MG tablet Take 1 tablet (5 mg total) by mouth every morning. 90 tablet 3  . benzonatate (TESSALON) 100 MG capsule Take 1 capsule (100 mg total) by mouth 3 (three) times daily. 60 capsule 3  . carboxymethylcellulose (REFRESH PLUS) 0.5 % SOLN Place 1 drop into both eyes 4 (four) times daily as needed.    . cycloSPORINE modified (NEORAL) 50 MG capsule Take 2 capsules (100 mg total) by mouth 2 (two) times daily. 120 capsule 2  . dapsone  100 MG tablet Take 1 tablet (100 mg total) by mouth daily. 30 tablet 2  . eltrombopag (PROMACTA) 75 MG tablet Take 75 mg by mouth daily.    Marland Kitchen levofloxacin (LEVAQUIN) 500 MG tablet Take 1 tablet (500 mg total) by mouth daily. 30 tablet 2  . lidocaine (XYLOCAINE) 2 % solution Use as directed 30 mLs in the mouth or throat every 6 (six) hours as needed. 300 mL 3  . Magnesium Chloride 64 MG TBEC Take 1 tablet (64 mg total) by mouth 2 (two) times daily. 60 tablet 3  . omeprazole (PRILOSEC) 40 MG capsule Take 1 capsule (40 mg total) by mouth daily. 90 capsule 3  . ondansetron (ZOFRAN) 8 MG tablet Take 1 tablet by mouth every 8 (eight) hours as needed for nausea.    Marland Kitchen  predniSONE (DELTASONE) 10 MG tablet 6 tablets.    . senna-docusate (SENOKOT-S) 8.6-50 MG tablet Take 2 tablets by mouth at bedtime.    . voriconazole (VFEND) 50 MG tablet Take 4 tablets by mouth every 12 (twelve) hours.     No current facility-administered medications for this visit.    REVIEW OF SYSTEMS:   Constitutional: ( - ) fevers, ( - )  chills , ( - ) night sweats Eyes: ( - ) blurriness of vision, ( - ) double vision, ( - ) watery eyes Ears, nose, mouth, throat, and face: ( - ) mucositis, ( - ) sore throat Respiratory: ( - ) cough, ( - ) dyspnea, ( - ) wheezes Cardiovascular: ( - ) palpitation, ( - ) chest discomfort, ( - ) lower extremity swelling Gastrointestinal:  ( - ) nausea, ( - ) heartburn, ( - ) change in bowel habits Skin: ( - ) abnormal skin rashes Lymphatics: ( - ) new lymphadenopathy, ( - ) easy bruising Neurological: ( - ) numbness, ( - ) tingling, ( - ) new weaknesses Behavioral/Psych: ( - ) mood change, ( - ) new changes  All other systems were reviewed with the patient and are negative.  PHYSICAL EXAMINATION: ECOG PERFORMANCE STATUS: 1 - Symptomatic but completely ambulatory  Vitals:   06/24/19 1053  BP: 127/88  Pulse: 91  Resp: 17  Temp: 99.1 F (37.3 C)  SpO2: 100%   Filed Weights   06/24/19 1053   Weight: 159 lb 9.6 oz (72.4 kg)    GENERAL:well appearing middle aged Hispanic male in NAD  SKIN: skin color, texture, turgor are normal, no rashes or significant lesions EYES: conjunctiva are pink and non-injected, sclera clear OROPHARYNX: no exudate, no erythema; lips,right cheek lesion, unchanged from prior. Dark colored, not bleeding.  LUNGS: clear to auscultation and percussion with normal breathing effort HEART: regular rate & rhythm and no murmurs and no lower extremity edema Musculoskeletal: no cyanosis of digits and no clubbing  PSYCH: alert & oriented x 3, fluent speech NEURO: no focal motor/sensory deficits  LABORATORY DATA:  I have reviewed the data as listed CBC Latest Ref Rng & Units 06/22/2019 06/20/2019 06/18/2019  WBC 4.0 - 10.5 K/uL 1.0(L) 0.7(LL) 0.7(LL)  Hemoglobin 13.0 - 17.0 g/dL 7.0(L) 6.4(LL) 7.0(L)  Hematocrit 39.0 - 52.0 % 20.8(L) 18.9(L) 20.5(L)  Platelets 150 - 400 K/uL <5(LL) 11(L) 3(LL)    CMP Latest Ref Rng & Units 06/13/2019 06/08/2019 06/06/2019  Glucose 70 - 99 mg/dL 96 631(S) 970(Y)  BUN 6 - 20 mg/dL 63(Z) 85(Y) 85(O)  Creatinine 0.61 - 1.24 mg/dL 2.77(A) 1.28(N) 8.67(E)  Sodium 135 - 145 mmol/L 137 133(L) 131(L)  Potassium 3.5 - 5.1 mmol/L 4.5 4.3 4.7  Chloride 98 - 111 mmol/L 104 101 100  CO2 22 - 32 mmol/L 23 24 24   Calcium 8.9 - 10.3 mg/dL 8.3(L) 8.2(L) 8.3(L)  Total Protein 6.5 - 8.1 g/dL 6.0(L) 5.6(L) 5.6(L)  Total Bilirubin 0.3 - 1.2 mg/dL 0.7 ) 7.2(C)  Alkaline Phos 38 - 126 U/L 300(H) 160(H) 134(H)  AST 15 - 41 U/L 55(H) 28 38  ALT 0 - 44 U/L 88(H) 63(H) 92(H)    RADIOGRAPHIC STUDIES: None to review.  ASSESSMENT & PLAN Arcangel Minion 59 y.o. male with medical history significant for aplastic anemia who presents to establish care with the [facility] for supportive care.  After review of his extensive outside history and after discussion with Dr. [facility] I am in agreement that the findings  are most consistent with an aplastic anemia.   Unfortunately he has failed standard therapy with horse ATG and subsequently has been trialed on salvage therapy with rabbit ATG.  Overall I worry that his prognosis is poor, however we are more than happy to offer supportive care locally while his primary hematologist at [facility] takes care of the further planning of his treatments.    After discussion with the patient and Dr. Lowella Bandy we have decided to take a comanage care approach where [facility] takes care of his day-to-day transfusion needs and Dr. Lowella Bandy helps coordinate larger efforts at [facility].  In the event the patient were to become sick with an infection or have other large needs requiring hospitalization we would recommend that he present to [facility], as he has established with numerous services there and they are familiar with his care.  In the interim we are more than happy to provide supportive services and provide any urgent needs he may have in the area.    In the event that his rabbit ATG salvage therapy does not provide a good response we will need to have further discussion about bone marrow transplant.  If for whatever reason bone marrow transplant is not available to this patient comfort care would be most appropriate.  We may also run into issues where with these frequent transfusions the patient no longer has compatible units.  We will continue to to monitor for the development of alloantibodies.  On discussion today the patient notes that he may want to move up to [place] to be with his son.  I noted that we would be happy to assist in his transition and that the local cancer center is [facility] [phone].  He can happily arrange for a transfer if this is what the patient and his family decide.  #Aplastic Anemia, s/p rATG salvage therapy  -- after discussion with Dr. Lowella Bandy, we have agreed to  assist with local management of Victor Porter's aplastic anemia. We can provide local supportive care with transfusions and urgent needs, however if he were to require further inpatient treatment of an acquired infection we would recommend transfer to [facility] where the specialists and services are familiar with him. Recommend periodic visits with Dr. Lowella Bandy to help guide treatment management, with local support here at the [facility]. --the patient has had no notable response to the hATG therapy yet. Will continue to monitor.  --continue the patient's Promacta, cyclosporin, and prednisone therapy  --agree with frequent CBC checks for transfusion support. Will set a goal of Hgb >7.0 and Plt >10. We will plan for him to be evaluated 3 times weekly initially and can change this based on his needs. --in mid December he finished rATG salvage therapy. If this is unsuccessful will need consideration for bone marrow transplant, though this would be difficult on a non-citizen with no insurance.  --may need to consider palliative care and goals of care discussions as he is prone to infections and may become hospitalized in the near future with a serious bleed or infection. Began discuss this topic at his last visit.  --RTC to see me in 4 weeks time. Return to infusion 3 x weekly for labs and transfusion (Mon/Wed/Fri).  #Infectious Disease --continue dapsone (started 05/19/2019) for PCP prophylaxis. Patient is G6PD negative --continue acyclovir for viral prophylaxis.  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 --completed course of cefazolin for Stenotrophomonas bacteremia.  #Cheek/Tongue Lesion --concern for neoplasm, has had biopsies performed at [facility] which reveal no malignancy --pain control with peridex mouthwash and oxycodone solution --not actively bleeding or irritating to the patient.   All questions were answered. The patient  knows to call the clinic with any problems, questions or concerns.  A total of more than 40 minutes were spent on this encounter and over half of that time was spent on counseling and coordination of care as outlined above.   Ulysees Barns, MD Department of Hematology/Oncology [facility] Phone: [phone] Pager: [phone] Email: [email or web address]  06/24/2019 10:55 AM

## 2019-06-27 ENCOUNTER — Other Ambulatory Visit: Payer: Self-pay | Admitting: *Deleted

## 2019-06-27 ENCOUNTER — Inpatient Hospital Stay: Payer: Self-pay

## 2019-06-27 ENCOUNTER — Other Ambulatory Visit: Payer: Self-pay

## 2019-06-27 ENCOUNTER — Inpatient Hospital Stay: Payer: Self-pay | Admitting: Hematology and Oncology

## 2019-06-27 ENCOUNTER — Telehealth: Payer: Self-pay | Admitting: Hematology and Oncology

## 2019-06-27 DIAGNOSIS — D619 Aplastic anemia, unspecified: Secondary | ICD-10-CM

## 2019-06-27 DIAGNOSIS — Z452 Encounter for adjustment and management of vascular access device: Secondary | ICD-10-CM

## 2019-06-27 LAB — CMP (CANCER CENTER ONLY)
ALT: 64 U/L — ABNORMAL HIGH (ref 0–44)
AST: 38 U/L (ref 15–41)
Albumin: 2.9 g/dL — ABNORMAL LOW (ref 3.5–5.0)
Alkaline Phosphatase: 181 U/L — ABNORMAL HIGH (ref 38–126)
Anion gap: 7 (ref 5–15)
BUN: 26 mg/dL — ABNORMAL HIGH (ref 6–20)
CO2: 24 mmol/L (ref 22–32)
Calcium: 8 mg/dL — ABNORMAL LOW (ref 8.9–10.3)
Chloride: 107 mmol/L (ref 98–111)
Creatinine: 1.02 mg/dL (ref 0.61–1.24)
GFR, Est AFR Am: >60 mL/min (ref >60)
GFR, Estimated: >60 mL/min (ref >60)
Glucose, Bld: 92 mg/dL (ref 70–99)
Potassium: 4.5 mmol/L (ref 3.5–5.1)
Sodium: 138 mmol/L (ref 135–145)
Total Bilirubin: 0.4 mg/dL (ref 0.3–1.2)
Total Protein: 5.9 g/dL — ABNORMAL LOW (ref 6.5–8.1)

## 2019-06-27 LAB — CBC WITH DIFFERENTIAL (CANCER CENTER ONLY)
Abs Immature Granulocytes: 0.01 10*3/uL (ref 0.00–0.07)
Basophils Absolute: 0 10*3/uL (ref 0.0–0.1)
Basophils Relative: 1 %
Eosinophils Absolute: 0 10*3/uL (ref 0.0–0.5)
Eosinophils Relative: 0 %
HCT: 24.6 % — ABNORMAL LOW (ref 39.0–52.0)
Hemoglobin: 8.3 g/dL — ABNORMAL LOW (ref 13.0–17.0)
Immature Granulocytes: 1 %
Lymphocytes Relative: 46 %
Lymphs Abs: 0.5 10*3/uL — ABNORMAL LOW (ref 0.7–4.0)
MCH: 27.7 pg (ref 26.0–34.0)
MCHC: 33.7 g/dL (ref 30.0–36.0)
MCV: 82 fL (ref 80.0–100.0)
Monocytes Absolute: 0.2 10*3/uL (ref 0.1–1.0)
Monocytes Relative: 18 %
Neutro Abs: 0.4 10*3/uL — CL (ref 1.7–7.7)
Neutrophils Relative %: 34 %
Platelet Count: <5 10*3/uL — CL (ref 150–400)
RBC: 3 MIL/uL — ABNORMAL LOW (ref 4.22–5.81)
RDW: 18.1 % — ABNORMAL HIGH (ref 11.5–15.5)
WBC Count: 1.1 10*3/uL — ABNORMAL LOW (ref 4.0–10.5)
nRBC: 0 % (ref 0.0–0.2)

## 2019-06-27 LAB — SAMPLE TO BLOOD BANK

## 2019-06-27 MED ORDER — ACETAMINOPHEN 325 MG PO TABS
650.0000 mg | ORAL_TABLET | Freq: Once | ORAL | Status: AC
  Administered 2019-06-27: 650 mg via ORAL

## 2019-06-27 MED ORDER — DIPHENHYDRAMINE HCL 25 MG PO CAPS
ORAL_CAPSULE | ORAL | Status: AC
  Filled 2019-06-27: qty 2

## 2019-06-27 MED ORDER — SODIUM CHLORIDE 0.9% FLUSH
3.0000 mL | INTRAVENOUS | Status: AC | PRN
  Administered 2019-06-27: 3 mL
  Filled 2019-06-27: qty 10

## 2019-06-27 MED ORDER — ACETAMINOPHEN 325 MG PO TABS
ORAL_TABLET | ORAL | Status: AC
  Filled 2019-06-27: qty 2

## 2019-06-27 MED ORDER — SODIUM CHLORIDE 0.9% IV SOLUTION
250.0000 mL | Freq: Once | INTRAVENOUS | Status: AC
  Administered 2019-06-27: 250 mL via INTRAVENOUS
  Filled 2019-06-27: qty 250

## 2019-06-27 MED ORDER — DIPHENHYDRAMINE HCL 25 MG PO CAPS
25.0000 mg | ORAL_CAPSULE | Freq: Once | ORAL | Status: AC
  Administered 2019-06-27: 25 mg via ORAL

## 2019-06-27 MED ORDER — SODIUM CHLORIDE 0.9% FLUSH
10.0000 mL | INTRAVENOUS | Status: DC | PRN
  Administered 2019-06-27: 12:00:00 10 mL via INTRAVENOUS
  Filled 2019-06-27: qty 10

## 2019-06-27 NOTE — Patient Instructions (Signed)
Gua de cuidados en el hogar de catter central de insercin perifrica (CCIP) PICC Home Care Guide  Un catter central de insercin perifrica (CCIP) es una forma de Teacher, adult education intravenoso (I.V.) que permite que los medicamentos y los lquidos intravenosos (I.V.) se distribuyan rpidamente en el organismo. Un CCIP es un tubo [place], delgado y flexible (catter) que se inserta en una vena de la parte superior del brazo. El catter finaliza en una vena grande en el pecho (vena cava superior o VCS). Despus de que se inserta el catter, se puede tomar una radiografa de trax para asegurarse de que est en el lugar correcto. Puede ser necesario colocar el catter por diferentes motivos, por ejemplo:  Para suministrar medicamentos y nutricin lquida.  Para administrar lquidos intravenosos (I.V.) y productos de Risk manager.  Si hay dificultad para colocar un catter intravenoso perifrico. Si se cuida adecuadamente, el catter [employer] en su lugar durante varios meses. Un CCIP tambin permite que la persona abandone el hospital y regrese a su casa ms rpidamente. Un familiar, mdico o un equipo de atencin mdica a domicilio pueden encargarse de los medicamentos y del cuidado del Brewing technologist. Cules son los riesgos? Generalmente, tener un CCIP es seguro. Sin embargo, pueden ocurrir Advice worker, por ejemplo:  Formacin de un cogulo sanguneo (trombo) en el CCIP o en la punta de [place].  Formacin de un cogulo sanguneo en una vena (trombosis venosa profunda) o un cogulo que se desplaza hasta el pulmn (embolia pulmonar).  Inflamacin de la vena (flebitis) en la que se coloc el CCIP.  Infeccin. La infeccin del torrente sanguneo asociada con la va central (CLABSI) es una infeccin grave que, por lo general, requiere de hospitalizacin.  Movimiento del CCIP (desplazamiento). La punta del CCIP se puede mover de su posicin original debido a una actividad fsica excesiva, tos forzada,  estornudos o vmitos.  Un corte o rotura del catter. Es importante no utilizar tijeras cerca del CCIP.  Irritacin o lesin del nervio o tendn durante la insercin del CCIP. Cmo cuidar Scientist, research (life sciences) problemas  Usted y los mdicos deben lavarse las manos con jabn, con frecuencia. Lvese las manos: ? Antes de tocar la lnea del CCIP o el dispositivo de infusin. ? Antes de cambiar una venda (vendaje).  Irrigue el CCIP en la forma en que le indic el profesional. Informe de inmediato al mdico si le resulta difcil purgar el CCIP o no puede hacerlo. No use la fuerza para irrigarlo.  No use una jeringa de menos de 37ml para irrigarlo.  Evite los controles de la presin arterial en el brazo en el que se Garment/textile technologist.  Nunca tire del catter.  No se quite el catter usted mismo. Solo un profesional entrenado puede [place].  Use nicamente suministros limpios y estriles. Mantenga los suministros en un lugar seco. No vuelva a usar agujas, jeringas o dems suministros. Si lo hace, puede provocar una infeccin.  Mantenga a las Neurosurgeon y a los nios alejados del CCIP.  Controle Immunologist de la insercin del CCIP todos los das para detectar si hay signos de infeccin. Est atento a los siguientes signos: ? Prdidas. ? Dolor, hinchazn o enrojecimiento. ? Lquido o sangre. ? Calor. ? Pus o mal olor. Cuidado del vendaje del CCIP  Mantenga el apsito del catter (vendaje) limpio y seco para evitar las infecciones.  No tome baos de inmersin, no nade ni use el jacuzzi hasta que el mdico lo autorice. Pregntele al  mdico si puede ducharse. Delle Reining solo le permitan tomar baos de [place]. Cuando se le permita baarse: ? Pdale al mdico que le ensee cmo vendar el CCIP. ? Cubra el CCIP con un envoltorio de plstico limpio y cinta adhesiva para que no se moje mientras se ducha.  Siga las indicaciones del mdico acerca de los cuidados del lugar de la insercin y el vendaje. Haga lo  siguiente: ? Lvese las manos con agua y jabn antes de Multimedia programmer las vendas (vendajes). Use desinfectante para manos si no dispone de [place] y [place]. ? Cambie el vendaje como se lo haya indicado el mdico. ? No retire los puntos (suturas), la goma para cerrar la piel o las tiras [place]. Es posible que estos cierres cutneos Conservation officer, nature en la piel durante 2semanas o ms tiempo. Si los bordes de las tiras [address] empiezan a despegarse y Scientific laboratory technician, puede recortar los que estn sueltos. No retire las tiras [employer] por completo a menos que el mdico se lo indique.  Cmbielo si est flojo o mojado. Instrucciones generales   Lleve consigo una tarjeta de identificacin de CCIP o use un brazalete de alerta mdico en todo momento.  Mantenga el tubo sujetado en todo momento, excepto cuando se use.  Lleve consigo una abrazadera con bordes suaves para colocar en el tubo en caso de que se rompa.  No use tijeras u objetos punzantes cerca del tubo.  Puede inclinar el brazo y [employer]. Si el catter est cerca o en el lugar en que dobla el codo, evite la actividad en la que deba mover repetidas veces el codo.  Evite levantar objetos pesados como se lo haya indicado el mdico.  Concurra a todas las visitas de control como se lo haya indicado el mdico. Esto es importante. Eliminacin de suministros  Deseche las agujas y las jeringas en un contenedor para desechos destinado a objetos punzantes(recipiente para objetos punzantes). Puede comprar un recipiente para objetos punzantes en [place], o puede hacer uno usted mismo con una botella de plstico rgida y Theme park manager.  Coloque vendas usadas o bolsas de infusin en una bolsa plstica. Tire esa bolsa en el contenedor de basura. Comunquese con un mdico si:  Siente dolor en el brazo, el odo, la cara o los dientes.  Tiene fiebre o siente escalofros.  Enrojecimiento, hinchazn o dolor alrededor del lugar de la  insercin.  Tiene lquido o [employer] del Environmental consultant de la insercin.  El lugar de la insercin est caliente al tacto.  Tiene pus o percibe mal olor que proviene del lugar de la insercin.  La piel se siente dura y est elevada alrededor del lugar de la insercin. Solicite ayuda de inmediato si:  El CCIP se Civil Service fast streamer. Si esto ocurre, cubra el lugar de la insercin con un vendaje o una gasa. No deseche el CCIP. El Chief Executive Officer.  El catter fue tironeado y se ha salido parcialmente. No trate de volver a Ecologist.  Usted no puede purgarlo, le resulta difcil hacerlo o pierde lquido alrededor del Environmental consultant de insercin cuando lo hace.  Escucha un ruido al purgarlo.  Siente que el corazn late rpidamente o faltan latidos.  El CCIP tiene un orificio o una rotura.  Tiene hinchado el brazo [employer] se insert el catter.  Tiene una mancha roja subindole por el brazo desde el lugar de insercin del CCIP. Resumen  Un Catter central de insercin perifrica (CCIP) es un tubo [place], delgado  y flexible (catter) que se inserta en una vena de la parte superior del brazo.  El catter lo inserta un enfermero o un mdico, utilizando una tcnica estril. Solo un profesional entrenado puede retirarlo.  Lleve la identificacin siempre con usted.  Evite los controles de la presin arterial en el brazo en el que se Location manager.  Si se cuida adecuadamente, el catter [employer] en su lugar durante varios meses. Un CCIP tambin permite que la persona abandone el hospital y regrese a su casa ms rpidamente. Esta informacin no tiene Marine scientist el consejo del mdico. Asegrese de hacerle al mdico cualquier pregunta que tenga. Document Revised: 11/05/2016 Document Reviewed: 11/05/2016 Elsevier Patient Education  [place].

## 2019-06-27 NOTE — Telephone Encounter (Signed)
Called with interpreter. Left msg. Mailed printout  °

## 2019-06-28 LAB — PREPARE PLATELET PHERESIS: Unit division: 0

## 2019-06-28 LAB — BPAM PLATELET PHERESIS
Blood Product Expiration Date: 202101202359
ISSUE DATE / TIME: 202101181407
Unit Type and Rh: 7300

## 2019-06-29 ENCOUNTER — Inpatient Hospital Stay: Payer: Self-pay

## 2019-06-29 ENCOUNTER — Other Ambulatory Visit: Payer: Self-pay

## 2019-06-29 ENCOUNTER — Other Ambulatory Visit: Payer: Self-pay | Admitting: *Deleted

## 2019-06-29 DIAGNOSIS — D619 Aplastic anemia, unspecified: Secondary | ICD-10-CM

## 2019-06-29 LAB — CBC WITH DIFFERENTIAL (CANCER CENTER ONLY)
Abs Immature Granulocytes: 0 10*3/uL (ref 0.00–0.07)
Basophils Absolute: 0 10*3/uL (ref 0.0–0.1)
Basophils Relative: 0 %
Eosinophils Absolute: 0 10*3/uL (ref 0.0–0.5)
Eosinophils Relative: 0 %
HCT: 20.2 % — ABNORMAL LOW (ref 39.0–52.0)
Hemoglobin: 6.8 g/dL — CL (ref 13.0–17.0)
Immature Granulocytes: 0 %
Lymphocytes Relative: 47 %
Lymphs Abs: 0.6 10*3/uL — ABNORMAL LOW (ref 0.7–4.0)
MCH: 27.8 pg (ref 26.0–34.0)
MCHC: 33.7 g/dL (ref 30.0–36.0)
MCV: 82.4 fL (ref 80.0–100.0)
Monocytes Absolute: 0.2 10*3/uL (ref 0.1–1.0)
Monocytes Relative: 17 %
Neutro Abs: 0.5 10*3/uL — ABNORMAL LOW (ref 1.7–7.7)
Neutrophils Relative %: 36 %
Platelet Count: 12 10*3/uL — ABNORMAL LOW (ref 150–400)
RBC: 2.45 MIL/uL — ABNORMAL LOW (ref 4.22–5.81)
RDW: 18.2 % — ABNORMAL HIGH (ref 11.5–15.5)
WBC Count: 1.3 10*3/uL — ABNORMAL LOW (ref 4.0–10.5)
nRBC: 0 % (ref 0.0–0.2)

## 2019-06-29 LAB — SAMPLE TO BLOOD BANK

## 2019-06-29 LAB — PREPARE RBC (CROSSMATCH)

## 2019-06-29 MED ORDER — HEPARIN SOD (PORK) LOCK FLUSH 100 UNIT/ML IV SOLN
250.0000 [IU] | INTRAVENOUS | Status: DC | PRN
  Filled 2019-06-29: qty 5

## 2019-06-29 MED ORDER — ACETAMINOPHEN 325 MG PO TABS
650.0000 mg | ORAL_TABLET | Freq: Once | ORAL | Status: AC
  Administered 2019-06-29: 650 mg via ORAL

## 2019-06-29 MED ORDER — SODIUM CHLORIDE 0.9% IV SOLUTION
250.0000 mL | Freq: Once | INTRAVENOUS | Status: AC
  Administered 2019-06-29: 250 mL via INTRAVENOUS
  Filled 2019-06-29: qty 250

## 2019-06-29 MED ORDER — ACETAMINOPHEN 325 MG PO TABS
ORAL_TABLET | ORAL | Status: AC
  Filled 2019-06-29: qty 2

## 2019-06-29 MED ORDER — SODIUM CHLORIDE 0.9% FLUSH
3.0000 mL | INTRAVENOUS | Status: DC | PRN
  Filled 2019-06-29: qty 10

## 2019-06-29 MED ORDER — DIPHENHYDRAMINE HCL 25 MG PO CAPS
ORAL_CAPSULE | ORAL | Status: AC
  Filled 2019-06-29: qty 1

## 2019-06-29 MED ORDER — DIPHENHYDRAMINE HCL 25 MG PO CAPS
25.0000 mg | ORAL_CAPSULE | Freq: Once | ORAL | Status: AC
  Administered 2019-06-29: 25 mg via ORAL

## 2019-06-29 NOTE — Patient Instructions (Signed)
Transfusin de sangre en los adultos, cuidados posteriores Blood Transfusion, Adult, Care After Esta hoja le brinda informacin sobre cmo cuidarse despus del procedimiento. El mdico tambin podr darle instrucciones ms especficas. Si tiene problemas o preguntas, llame al mdico. Qu puedo esperar despus del procedimiento? Despus del procedimiento, es normal tener los siguientes sntomas:  Hematomas y dolor en el lugar de la va intravenosa (i.v.).  Fiebre o escalofros el da del procedimiento. Esta puede ser la respuesta del cuerpo a las nuevas clulas sanguneas recibidas.  Dolor de cabeza. Siga estas instrucciones en su casa: Cuidados del lugar de la insercin      Siga las instrucciones del mdico en lo que respecta al cuidado del lugar de insercin. Este es el lugar donde se coloc un tubo (catter) intravenoso en la vena. Asegrese de hacer lo siguiente: ? Lvese las manos con agua y jabn antes y despus de cambiar la venda (vendaje). Use un desinfectante para manos si no dispone de agua y jabn. ? Cambie las vendas como se lo haya indicado el mdico.  Controle el lugar de insercin todos los das para detectar signos de infeccin. Preste atencin a los siguientes signos: ? Dolor, hinchazn o enrojecimiento. ? Sangrado proveniente del lugar. ? Calor. ? Pus o mal olor. Instrucciones generales  Use los medicamentos de venta libre y los recetados solamente como se lo haya indicado el mdico.  Haga reposo como se lo haya indicado el mdico.  Retome sus actividades habituales como se lo haya indicado el mdico.  Concurra a todas las visitas de seguimiento como se lo haya indicado el mdico. Esto es importante. Comunquese con un mdico si:  Tiene picazn o zonas enrojecidas e hinchadas en la piel (urticaria).  Est preocupado o nervioso (ansioso).  Se siente dbil despus de realizar sus actividades habituales.  Tiene enrojecimiento, hinchazn, calor o dolor  alrededor del lugar de la insercin.  Observa sangre que sale del lugar de la insercin que no se detiene al ejercer presin.  Tiene pus o percibe mal olor que proviene del lugar de la insercin. Solicite ayuda de inmediato si:  Tiene signos de una reaccin grave. Puede deberse a una alergia o provenir del sistema de defensa del cuerpo (sistema inmunitario). Algunos signos son los siguientes: ? Dificultad para respirar o falta de aire. ? Hinchazn en la cara o sensacin de calor (sofoco). ? Fiebre o escalofros. ? Dolor de cabeza, de pecho o de espalda. ? Pis (orina) de color oscuro o sangre en el pis. ? Erupcin cutnea generalizada. ? Latidos cardacos acelerados. ? Sentirse mareado o aturdido. Es posible que reciba la transfusin de sangre en un entorno ambulatorio. En tal caso, le indicarn con quin se debe poner en contacto para informar cualquier reaccin. Estos sntomas pueden indicar una emergencia. No espere a ver si los sntomas desaparecen. Solicite atencin mdica de inmediato. Comunquese con el servicio de emergencias de su localidad (911 en los [place]). No conduzca por sus propios medios hasta el hospital. Resumen  Es normal tener moretones y dolor en el lugar donde se coloc la va intravenosa (i.v.).  Controle el lugar de insercin todos los das para detectar signos de infeccin.  Haga reposo como se lo haya indicado el mdico. Retome sus actividades habituales como se lo haya indicado el mdico.  Obtenga ayuda de inmediato si tiene signos de una reaccin grave. Esta informacin no tiene como fin reemplazar el consejo del mdico. Asegrese de hacerle al mdico cualquier pregunta que tenga. Document   Revised: 01/11/2019 Document Reviewed: 01/11/2019 Elsevier Patient Education  2020 Elsevier Inc.  

## 2019-06-29 NOTE — Progress Notes (Signed)
LATE ENTRY-Lab called to report to Dr. Leonides Schanz that patient's Hgb is 6.8.  Infusion already had him scheduled for 2 units of blood and possible platelets.  Patient now only getting 1 unit of blood and infusion charge nurse Amy made aware. Lorayne Marek, RN

## 2019-06-30 LAB — BPAM RBC
Blood Product Expiration Date: 202102132359
ISSUE DATE / TIME: 202101201333
Unit Type and Rh: 5100

## 2019-06-30 LAB — TYPE AND SCREEN
ABO/RH(D): O POS
Antibody Screen: NEGATIVE
Unit division: 0

## 2019-07-01 ENCOUNTER — Telehealth: Payer: Self-pay | Admitting: *Deleted

## 2019-07-01 ENCOUNTER — Other Ambulatory Visit: Payer: Self-pay | Admitting: Hematology and Oncology

## 2019-07-01 ENCOUNTER — Encounter: Payer: Self-pay | Admitting: General Practice

## 2019-07-01 ENCOUNTER — Telehealth: Payer: Self-pay | Admitting: General Practice

## 2019-07-01 ENCOUNTER — Inpatient Hospital Stay: Payer: Self-pay

## 2019-07-01 ENCOUNTER — Other Ambulatory Visit: Payer: Self-pay

## 2019-07-01 ENCOUNTER — Other Ambulatory Visit: Payer: Self-pay | Admitting: *Deleted

## 2019-07-01 DIAGNOSIS — Z95828 Presence of other vascular implants and grafts: Secondary | ICD-10-CM

## 2019-07-01 DIAGNOSIS — D619 Aplastic anemia, unspecified: Secondary | ICD-10-CM

## 2019-07-01 DIAGNOSIS — Z452 Encounter for adjustment and management of vascular access device: Secondary | ICD-10-CM

## 2019-07-01 DIAGNOSIS — D61818 Other pancytopenia: Secondary | ICD-10-CM

## 2019-07-01 LAB — CBC WITH DIFFERENTIAL (CANCER CENTER ONLY)
Abs Immature Granulocytes: 0 10*3/uL (ref 0.00–0.07)
Basophils Absolute: 0 10*3/uL (ref 0.0–0.1)
Basophils Relative: 0 %
Eosinophils Absolute: 0 10*3/uL (ref 0.0–0.5)
Eosinophils Relative: 0 %
HCT: 23 % — ABNORMAL LOW (ref 39.0–52.0)
Hemoglobin: 7.9 g/dL — ABNORMAL LOW (ref 13.0–17.0)
Immature Granulocytes: 0 %
Lymphocytes Relative: 43 %
Lymphs Abs: 0.6 10*3/uL — ABNORMAL LOW (ref 0.7–4.0)
MCH: 27.9 pg (ref 26.0–34.0)
MCHC: 34.3 g/dL (ref 30.0–36.0)
MCV: 81.3 fL (ref 80.0–100.0)
Monocytes Absolute: 0.3 10*3/uL (ref 0.1–1.0)
Monocytes Relative: 20 %
Neutro Abs: 0.5 10*3/uL — ABNORMAL LOW (ref 1.7–7.7)
Neutrophils Relative %: 37 %
Platelet Count: <5 10*3/uL — CL (ref 150–400)
RBC: 2.83 MIL/uL — ABNORMAL LOW (ref 4.22–5.81)
RDW: 17.2 % — ABNORMAL HIGH (ref 11.5–15.5)
WBC Count: 1.3 10*3/uL — ABNORMAL LOW (ref 4.0–10.5)
nRBC: 0 % (ref 0.0–0.2)

## 2019-07-01 LAB — SAMPLE TO BLOOD BANK

## 2019-07-01 MED ORDER — DIPHENHYDRAMINE HCL 25 MG PO CAPS
ORAL_CAPSULE | ORAL | Status: AC
  Filled 2019-07-01: qty 1

## 2019-07-01 MED ORDER — DIPHENHYDRAMINE HCL 25 MG PO CAPS
25.0000 mg | ORAL_CAPSULE | Freq: Once | ORAL | Status: AC
  Administered 2019-07-01: 25 mg via ORAL

## 2019-07-01 MED ORDER — ACETAMINOPHEN 325 MG PO TABS
650.0000 mg | ORAL_TABLET | Freq: Once | ORAL | Status: AC
  Administered 2019-07-01: 650 mg via ORAL

## 2019-07-01 MED ORDER — AMOXICILLIN-POT CLAVULANATE 875-125 MG PO TABS: 1.0000 | ORAL_TABLET | Freq: Two times a day (BID) | ORAL | 0 refills | Status: DC

## 2019-07-01 MED ORDER — SODIUM CHLORIDE 0.9% FLUSH
10.0000 mL | INTRAVENOUS | Status: DC | PRN
  Administered 2019-07-01: 12:00:00 10 mL via INTRAVENOUS
  Filled 2019-07-01: qty 10

## 2019-07-01 MED ORDER — ACETAMINOPHEN 325 MG PO TABS
ORAL_TABLET | ORAL | Status: AC
  Filled 2019-07-01: qty 2

## 2019-07-01 MED ORDER — SODIUM CHLORIDE 0.9% IV SOLUTION
250.0000 mL | Freq: Once | INTRAVENOUS | Status: AC
  Administered 2019-07-01: 250 mL via INTRAVENOUS
  Filled 2019-07-01: qty 250

## 2019-07-01 MED ORDER — SODIUM CHLORIDE 0.9% FLUSH
3.0000 mL | INTRAVENOUS | Status: AC | PRN
  Administered 2019-07-01: 3 mL
  Filled 2019-07-01: qty 10

## 2019-07-01 MED ORDER — HEPARIN SOD (PORK) LOCK FLUSH 100 UNIT/ML IV SOLN
250.0000 [IU] | INTRAVENOUS | Status: AC | PRN
  Administered 2019-07-01: 250 [IU]
  Filled 2019-07-01: qty 5

## 2019-07-01 NOTE — Telephone Encounter (Signed)
[facility] CSW Progress Notes  Called patient at request of [employer] Charity fundraiser.  Was not able to see patient during infusion, called using Spanish interpreter.  States that he had an appointment - "I need to be moving to [place], I don't know if I need to use the airplane or the car."  Wanted to know if he could pick up his antibiotic at [facility], informed that he has been qualified for the [employer] ($700) and can spend up to that amount at [facility].  Provided contact information for both Legal Aid [phone]) and [employer] (via [facility]).  Wants help w paying for travel to [place], states he does not have funds to pay for transport.  Advised that sometimes fundraising is done through [employer].    Santa Genera, LCSW Clinical Social Worker Phone:  [phone] Cell:  [phone]

## 2019-07-01 NOTE — Patient Instructions (Signed)
PICC Home Care Guide  A peripherally inserted central catheter (PICC) is a form of IV access that allows medicines and IV fluids to be quickly distributed throughout the body. The PICC is a long, thin, flexible tube (catheter) that is inserted into a vein in the upper arm. The catheter ends in a large vein in the chest (superior vena cava, or SVC). After the PICC is inserted, a chest X-ray may be done to make sure that it is in the correct place. A PICC may be placed for different reasons, such as:  To give medicines and liquid nutrition.  To give IV fluids and blood products.  If there is trouble placing a peripheral intravenous (PIV) catheter. If taken care of properly, a PICC can remain in place for several months. Having a PICC can also allow a person to go home from the hospital sooner. Medicine and PICC care can be managed at home by a family member, caregiver, or home health care team. What are the risks? Generally, having a PICC is safe. However, problems may occur, including:  A blood clot (thrombus) forming in or at the tip of the PICC.  A blood clot forming in a vein (deep vein thrombosis) or traveling to the lung (pulmonary embolism).  Inflammation of the vein (phlebitis) in which the PICC is placed.  Infection. Central line associated blood stream infection (CLABSI) is a serious infection that often requires hospitalization.  PICC movement (malposition). The PICC tip may move from its original position due to excessive physical activity, forceful coughing, sneezing, or vomiting.  A break or cut in the PICC. It is important not to use scissors near the PICC.  Nerve or tendon irritation or injury during PICC insertion. How to take care of your PICC Preventing problems  You and any caregivers should wash your hands often with soap. Wash hands: ? Before touching the PICC line or the infusion device. ? Before changing a bandage (dressing).  Flush the PICC as told by your  health care provider. Let your health care provider know right away if the PICC is hard to flush or does not flush. Do not use force to flush the PICC.  Do not use a syringe that is less than 10 mL to flush the PICC.  Avoid blood pressure checks on the arm in which the PICC is placed.  Never pull or tug on the PICC.  Do not take the PICC out yourself. Only a trained clinical professional should remove the PICC.  Use clean and sterile supplies only. Keep the supplies in a dry place. Do not reuse needles, syringes, or any other supplies. Doing that can lead to infection.  Keep pets and children away from your PICC line.  Check the PICC insertion site every day for signs of infection. Check for: ? Leakage. ? Redness, swelling, or pain. ? Fluid or blood. ? Warmth. ? Pus or a bad smell. PICC dressing care  Keep your PICC bandage (dressing) clean and dry to prevent infection.  Do not take baths, swim, or use a hot tub until your health care provider approves. Ask your health care provider if you can take showers. You may only be allowed to take sponge baths for bathing. When you are allowed to shower: ? Ask your health care provider to teach you how to wrap the PICC line. ? Cover the PICC line with clear plastic wrap and tape to keep it dry while showering.  Follow instructions from your health care provider   about how to take care of your insertion site and dressing. Make sure you: ? Wash your hands with soap and water before you change your bandage (dressing). If soap and water are not available, use hand sanitizer. ? Change your dressing as told by your health care provider. ? Leave stitches (sutures), skin glue, or adhesive strips in place. These skin closures may need to stay in place for 2 weeks or longer. If adhesive strip edges start to loosen and curl up, you may trim the loose edges. Do not remove adhesive strips completely unless your health care provider tells you to do  that.  Change your PICC dressing if it becomes loose or wet. General instructions   Carry your PICC identification card or wear a medical alert bracelet at all times.  Keep the tube clamped at all times, unless it is being used.  Carry a smooth-edge clamp with you at all times to place on the tube if it breaks.  Do not use scissors or sharp objects near the tube.  You may bend your arm and move it freely. If your PICC is near or at the bend of your elbow, avoid activity with repeated motion at the elbow.  Avoid lifting heavy objects as told by your health care provider.  Keep all follow-up visits as told by your health care provider. This is important. Disposal of supplies  Throw away any syringes in a disposal container that is meant for sharp items (sharps container). You can buy a sharps container from a pharmacy, or you can make one by using an empty hard plastic bottle with a cover.  Place any used dressings or infusion bags into a plastic bag. Throw that bag in the trash. Contact a health care provider if:  You have pain in your arm, ear, face, or teeth.  You have a fever or chills.  You have redness, swelling, or pain around the insertion site.  You have fluid or blood coming from the insertion site.  Your insertion site feels warm to the touch.  You have pus or a bad smell coming from the insertion site.  Your skin feels hard and raised around the insertion site. Get help right away if:  Your PICC is accidentally pulled all the way out. If this happens, cover the insertion site with a bandage or gauze dressing. Do not throw the PICC away. Your health care provider will need to check it.  Your PICC was tugged or pulled and has partially come out. Do not  push the PICC back in.  You cannot flush the PICC, it is hard to flush, or the PICC leaks around the insertion site when it is flushed.  You hear a "flushing" sound when the PICC is flushed.  You feel your  heart racing or skipping beats.  There is a hole or tear in the PICC.  You have swelling in the arm in which the PICC was inserted.  You have a red streak going up your arm from where the PICC was inserted. Summary  A peripherally inserted central catheter (PICC) is a long, thin, flexible tube (catheter) that is inserted into a vein in the upper arm.  The PICC is inserted using a sterile technique by a specially trained nurse or physician. Only a trained clinical professional should remove it.  Keep your PICC identification card with you at all times.  Avoid blood pressure checks on the arm in which the PICC is placed.  If cared for   properly, a PICC can remain in place for several months. Having a PICC can also allow a person to go home from the hospital sooner. This information is not intended to replace advice given to you by your health care provider. Make sure you discuss any questions you have with your health care provider. Document Revised: 05/08/2017 Document Reviewed: 06/28/2016 Elsevier Patient Education  2020 Elsevier Inc.  

## 2019-07-01 NOTE — Progress Notes (Signed)
Patient complained of right facial swelling, pain 2/10 on pain scale. No bleeding or fever at this time. Contacted Dr Leonides Schanz and he cameover  to take a look. Did recognize increase swelling and sent an antibiotic to [facility]. Made patient aware. Also sent a message to the MD nurse regarding medical records for immigration.

## 2019-07-01 NOTE — Telephone Encounter (Signed)
Referral faxed to [facility] - Release [id number]

## 2019-07-01 NOTE — Patient Instructions (Addendum)
Transfusin de plaquetas Platelet Transfusion La transfusin de plaquetas es un procedimiento en el cual se reciben plaquetas de un donante a travs de una va intravenosa. Las plaquetas son fragmentos diminutos de clulas sanguneas. Cuando una persona sufre una lesin, las plaquetas se agrupan en la zona lesionada para formar un cogulo de [place]. Esto ayuda a Sales promotion account executive y es el principio del proceso de Development worker, community. Si tiene [employer], es posible que su sangre tenga problemas para Designer, fashion/clothing. Esto puede causar sangrado y moretones con mucha facilidad. Tal vez deba recibir una transfusin de plaquetas si tiene una enfermedad que reduce el nmero de plaquetas (trombocitopenia). Se puede usar una transfusin de plaquetas para detener o evitar el sangrado excesivo. Informe al mdico acerca de lo siguiente:  Las reacciones que haya tenido durante transfusiones previas.  Cualquier alergia que tenga.  Todos los [employer], incluidos vitaminas, hierbas, gotas oftlmicas, cremas y medicamentos de venta libre.  Cualquier enfermedad de la sangre que tenga.  Cirugas previas a las que se someti.  Cualquier afeccin mdica que tenga.  Si est embarazada o podra estarlo. Cules son los riesgos? En general, se trata de un procedimiento seguro. Sin embargo, pueden ocurrir complicaciones, por ejemplo:  Cristy Hilts.  Infeccin.  Reaccin alrgica a las plaquetas del donante.  Que el sistema de su cuerpo que combate las enfermedades (sistema inmunitario) ataque las plaquetas del donante (reaccin hemoltica). Esto es poco frecuente.  Una reaccin poco frecuente que causa dao pulmonar (lesin pulmonar aguda producida por transfusin). Qu ocurre antes del procedimiento? Medicamentos  Consulte al mdico sobre: ? Quarry manager o suspender los medicamentos que toma habitualmente. Esto es muy importante si toma medicamentos para la diabetes o anticoagulantes. ? Tomar  medicamentos como aspirina e ibuprofeno. Estos medicamentos pueden tener un efecto anticoagulante en la [place]. No tome estos medicamentos a menos que el mdico se lo indique. ? Tomar medicamentos de [employer], vitaminas, hierbas y suplementos. Instrucciones generales  Se le realizar un anlisis de sangre para determinar su grupo sanguneo. Su tipo de sangre determina qu tipo de plaquetas recibir.  Siga las indicaciones del mdico respecto de las restricciones para las comidas o las bebidas.  Si tuvo una reaccin alrgica a una transfusin en el pasado, tal vez le administren un medicamento que ayude a evitarla.  Le controlarn la temperatura, la presin arterial, el pulso y la respiracin. Qu ocurre durante el procedimiento?   Le colocarn una va intravenosa en una de las venas.  Por su seguridad, dos mdicos verificarn su identidad y las plaquetas del donante que se le van a infundir.  Una bolsa con las plaquetas del donante se conectar a su va intravenosa. Las plaquetas ingresarn en su torrente sanguneo. Esto generalmente demora entre 30 y 72 minutos.  Durante la transfusin, le controlarn la temperatura, la presin arterial, el pulso y la respiracin. Esto ayuda a detectar signos tempranos de una reaccin.  Tambin lo controlarn para Hydrographic surveyor otros sntomas que puedan indicar una reaccin, como escalofros, urticaria o picazn.  Si tiene signos de Freight forwarder, se suspender la transfusin y tal vez le administren un medicamento para Producer, television/film/video.  Una vez finalizada la transfusin, se retirar la va intravenosa.  Se puede aplicar presin en el lugar de la va intravenosa (i.v.) para detener cualquier sangrado.  El lugar de la va intravenosa (i.v.) se cubrir con un vendaje. Este procedimiento puede variar segn el mdico y el hospital. Sander Nephew sucede despus del procedimiento?  Le controlarn la presin arterial, la temperatura, el pulso  y la respiracin hasta que abandone el hospital o la clnica.  Es posible que tenga algunos moretones y Stage manager en el lugar de la va intravenosa (i.v.). Siga estas indicaciones en su casa: Medicamentos  Tome los medicamentos de venta libre y los recetados solamente como se lo haya indicado el mdico.  Hable con el mdico antes de tomar cualquier medicamento que contenga aspirina o antiinflamatorios no esteroideos (AINE). Estos medicamentos aumentan el riesgo de hemorragias peligrosas. Instrucciones generales  Cambie o retire Office manager se lo haya indicado el mdico.  Retome sus actividades normales como se lo haya indicado el mdico. Pregntele al mdico qu actividades son seguras para usted.  No tome baos de inmersin, no nade ni use el jacuzzi hasta que el mdico lo autorice. Pregntele al mdico si puede ducharse.  Controle el lugar de la va intravenosa (i.v.) [employer] para descartar signos de infeccin. Est atento a los siguientes signos: ? Dolor, hinchazn o enrojecimiento. ? Lquido o sangre. Si sale lquido o sangre del Environmental consultant de la va intravenosa (i.v.), ejerza presin firme con las manos sobre el vendaje que cubre la zona durante uno o dos minutos. Al hacerlo, debera detenerse el sangrado. ? Calor. ? Pus o mal olor.  Concurra a todas las visitas de control como se lo haya indicado el mdico. Esto es importante. Comunquese con un mdico si tiene:  Dolor de cabeza que no se alivia con medicamentos.  Ronchas, erupcin o picazn en la piel.  Nuseas o vmitos.  Cansancio o debilidad inusuales.  Signos de infeccin en el lugar de la va intravenosa (i.v.). Solicite ayuda de inmediato si:  Tiene fiebre o siente escalofros.  Orina con menos frecuencia que lo habitual.  La orina es de un color ms oscuro que lo normal.  Tiene alguno de los siguientes sntomas: ? Dificultad para Industrial/product designer. ? Dolor en la espalda, el abdomen o el pecho. ? Piel fra y  hmeda. ? Latidos cardacos acelerados. Resumen  Las plaquetas son fragmentos diminutos de clulas sanguneas que se agrupan para formar un cogulo de sangre cuando se produce una herida. Si tiene [employer], es posible que su sangre tenga problemas para Biochemist, clinical.  La transfusin de plaquetas es un procedimiento en el cual se reciben plaquetas de un donante a travs de una va intravenosa.  Se puede usar una transfusin de plaquetas para detener o evitar el sangrado excesivo.  Despus del procedimiento, revise el lugar de la va intravenosa (i.v.) todos los das para detectar signos de infeccin, como enrojecimiento, hinchazn, dolor o calor. Esta informacin no tiene Theme park manager el consejo del mdico. Asegrese de hacerle al mdico cualquier pregunta que tenga. Document Revised: 08/03/2017 Document Reviewed: 08/03/2017 Elsevier Patient Education  2020 [employer].

## 2019-07-02 LAB — PREPARE PLATELET PHERESIS: Unit division: 0

## 2019-07-02 LAB — BPAM PLATELET PHERESIS
Blood Product Expiration Date: 202101242359
ISSUE DATE / TIME: 202101221318
Unit Type and Rh: 5100

## 2019-07-04 ENCOUNTER — Inpatient Hospital Stay: Payer: Self-pay

## 2019-07-04 ENCOUNTER — Other Ambulatory Visit: Payer: Self-pay

## 2019-07-04 DIAGNOSIS — D619 Aplastic anemia, unspecified: Secondary | ICD-10-CM

## 2019-07-04 DIAGNOSIS — D61818 Other pancytopenia: Secondary | ICD-10-CM

## 2019-07-04 DIAGNOSIS — Z95828 Presence of other vascular implants and grafts: Secondary | ICD-10-CM | POA: Insufficient documentation

## 2019-07-04 LAB — CBC WITH DIFFERENTIAL (CANCER CENTER ONLY)
Abs Immature Granulocytes: 0.01 10*3/uL (ref 0.00–0.07)
Basophils Absolute: 0 10*3/uL (ref 0.0–0.1)
Basophils Relative: 0 %
Eosinophils Absolute: 0 10*3/uL (ref 0.0–0.5)
Eosinophils Relative: 0 %
HCT: 21.4 % — ABNORMAL LOW (ref 39.0–52.0)
Hemoglobin: 7.1 g/dL — ABNORMAL LOW (ref 13.0–17.0)
Immature Granulocytes: 1 %
Lymphocytes Relative: 42 %
Lymphs Abs: 0.6 10*3/uL — ABNORMAL LOW (ref 0.7–4.0)
MCH: 27.6 pg (ref 26.0–34.0)
MCHC: 33.2 g/dL (ref 30.0–36.0)
MCV: 83.3 fL (ref 80.0–100.0)
Monocytes Absolute: 0.3 10*3/uL (ref 0.1–1.0)
Monocytes Relative: 18 %
Neutro Abs: 0.6 10*3/uL — ABNORMAL LOW (ref 1.7–7.7)
Neutrophils Relative %: 39 %
Platelet Count: 11 10*3/uL — ABNORMAL LOW (ref 150–400)
RBC: 2.57 MIL/uL — ABNORMAL LOW (ref 4.22–5.81)
RDW: 17.1 % — ABNORMAL HIGH (ref 11.5–15.5)
WBC Count: 1.5 10*3/uL — ABNORMAL LOW (ref 4.0–10.5)
nRBC: 0 % (ref 0.0–0.2)

## 2019-07-04 LAB — SAMPLE TO BLOOD BANK

## 2019-07-04 MED ORDER — SODIUM CHLORIDE 0.9% FLUSH
10.0000 mL | Freq: Once | INTRAVENOUS | Status: AC
  Administered 2019-07-04: 12:00:00 10 mL
  Filled 2019-07-04: qty 10

## 2019-07-06 ENCOUNTER — Inpatient Hospital Stay: Payer: Self-pay

## 2019-07-06 ENCOUNTER — Other Ambulatory Visit: Payer: Self-pay | Admitting: *Deleted

## 2019-07-06 ENCOUNTER — Other Ambulatory Visit: Payer: Self-pay

## 2019-07-06 DIAGNOSIS — Z95828 Presence of other vascular implants and grafts: Secondary | ICD-10-CM

## 2019-07-06 DIAGNOSIS — D619 Aplastic anemia, unspecified: Secondary | ICD-10-CM

## 2019-07-06 DIAGNOSIS — D61818 Other pancytopenia: Secondary | ICD-10-CM

## 2019-07-06 LAB — CBC WITH DIFFERENTIAL (CANCER CENTER ONLY)
Abs Immature Granulocytes: 0.01 10*3/uL (ref 0.00–0.07)
Basophils Absolute: 0 10*3/uL (ref 0.0–0.1)
Basophils Relative: 0 %
Eosinophils Absolute: 0 10*3/uL (ref 0.0–0.5)
Eosinophils Relative: 0 %
HCT: 20.1 % — ABNORMAL LOW (ref 39.0–52.0)
Hemoglobin: 6.7 g/dL — CL (ref 13.0–17.0)
Immature Granulocytes: 1 %
Lymphocytes Relative: 47 %
Lymphs Abs: 0.7 10*3/uL (ref 0.7–4.0)
MCH: 27.5 pg (ref 26.0–34.0)
MCHC: 33.3 g/dL (ref 30.0–36.0)
MCV: 82.4 fL (ref 80.0–100.0)
Monocytes Absolute: 0.2 10*3/uL (ref 0.1–1.0)
Monocytes Relative: 15 %
Neutro Abs: 0.6 10*3/uL — ABNORMAL LOW (ref 1.7–7.7)
Neutrophils Relative %: 37 %
Platelet Count: <5 10*3/uL — CL (ref 150–400)
RBC: 2.44 MIL/uL — ABNORMAL LOW (ref 4.22–5.81)
RDW: 17.3 % — ABNORMAL HIGH (ref 11.5–15.5)
WBC Count: 1.5 10*3/uL — ABNORMAL LOW (ref 4.0–10.5)
nRBC: 0 % (ref 0.0–0.2)

## 2019-07-06 LAB — PREPARE RBC (CROSSMATCH)

## 2019-07-06 LAB — SAMPLE TO BLOOD BANK

## 2019-07-06 MED ORDER — DIPHENHYDRAMINE HCL 25 MG PO CAPS
25.0000 mg | ORAL_CAPSULE | Freq: Once | ORAL | Status: AC
  Administered 2019-07-06: 25 mg via ORAL

## 2019-07-06 MED ORDER — SODIUM CHLORIDE 0.9% FLUSH
3.0000 mL | INTRAVENOUS | Status: AC | PRN
  Administered 2019-07-06: 3 mL
  Filled 2019-07-06: qty 10

## 2019-07-06 MED ORDER — SODIUM CHLORIDE 0.9% IV SOLUTION
250.0000 mL | Freq: Once | INTRAVENOUS | Status: AC
  Administered 2019-07-06: 250 mL via INTRAVENOUS
  Filled 2019-07-06: qty 250

## 2019-07-06 MED ORDER — HEPARIN SOD (PORK) LOCK FLUSH 100 UNIT/ML IV SOLN: 500.0000 [IU] | Freq: Once | INTRAVENOUS | Status: AC

## 2019-07-06 MED ORDER — HEPARIN SOD (PORK) LOCK FLUSH 100 UNIT/ML IV SOLN
250.0000 [IU] | INTRAVENOUS | Status: AC | PRN
  Administered 2019-07-06: 250 [IU]
  Filled 2019-07-06: qty 5

## 2019-07-06 MED ORDER — ACETAMINOPHEN 325 MG PO TABS
ORAL_TABLET | ORAL | Status: AC
  Filled 2019-07-06: qty 2

## 2019-07-06 MED ORDER — HEPARIN SOD (PORK) LOCK FLUSH 100 UNIT/ML IV SOLN
500.0000 [IU] | Freq: Once | INTRAVENOUS | Status: AC
  Administered 2019-07-06: 11:00:00 500 [IU]
  Filled 2019-07-06: qty 5

## 2019-07-06 MED ORDER — SODIUM CHLORIDE 0.9% FLUSH
10.0000 mL | Freq: Once | INTRAVENOUS | Status: AC
  Administered 2019-07-06: 11:00:00 10 mL
  Filled 2019-07-06: qty 10

## 2019-07-06 MED ORDER — ACETAMINOPHEN 325 MG PO TABS
650.0000 mg | ORAL_TABLET | Freq: Once | ORAL | Status: AC
  Administered 2019-07-06: 650 mg via ORAL

## 2019-07-06 MED ORDER — DIPHENHYDRAMINE HCL 25 MG PO CAPS
ORAL_CAPSULE | ORAL | Status: AC
  Filled 2019-07-06: qty 1

## 2019-07-06 NOTE — Patient Instructions (Addendum)
Transfusin de sangre en los adultos, cuidados posteriores Blood Transfusion, Adult, Care After Esta hoja le brinda informacin sobre cmo cuidarse despus del procedimiento. El mdico tambin podr darle instrucciones ms especficas. Si tiene problemas o preguntas, llame al mdico. Qu puedo esperar despus del procedimiento? Despus del procedimiento, es normal tener los siguientes sntomas:  Hematomas y dolor en el lugar de la va intravenosa (i.v.).  Fiebre o escalofros el da del procedimiento. Esta puede ser la respuesta del cuerpo a las nuevas clulas sanguneas recibidas.  Dolor de cabeza. Siga estas instrucciones en su casa: Cuidados del lugar de la insercin      Siga las instrucciones del mdico en lo que respecta al cuidado del lugar de insercin. Este es el lugar donde se coloc un tubo (catter) intravenoso en la vena. Asegrese de hacer lo siguiente: ? Lvese las manos con agua y jabn antes y despus de cambiar la venda (vendaje). Use un desinfectante para manos si no dispone de agua y jabn. ? Cambie las vendas como se lo haya indicado el mdico.  Controle el lugar de insercin todos los das para detectar signos de infeccin. Preste atencin a los siguientes signos: ? Dolor, hinchazn o enrojecimiento. ? Sangrado proveniente del lugar. ? Calor. ? Pus o mal olor. Instrucciones generales  Use los medicamentos de venta libre y los recetados solamente como se lo haya indicado el mdico.  Haga reposo como se lo haya indicado el mdico.  Retome sus actividades habituales como se lo haya indicado el mdico.  Concurra a todas las visitas de seguimiento como se lo haya indicado el mdico. Esto es importante. Comunquese con un mdico si:  Tiene picazn o zonas enrojecidas e hinchadas en la piel (urticaria).  Est preocupado o nervioso (ansioso).  Se siente dbil despus de realizar sus actividades habituales.  Tiene enrojecimiento, hinchazn, calor o dolor  alrededor del lugar de la insercin.  Observa sangre que sale del lugar de la insercin que no se detiene al ejercer presin.  Tiene pus o percibe mal olor que proviene del lugar de la insercin. Solicite ayuda de inmediato si:  Tiene signos de una reaccin grave. Puede deberse a una alergia o provenir del sistema de defensa del cuerpo (sistema inmunitario). Algunos signos son los siguientes: ? Dificultad para respirar o falta de aire. ? Hinchazn en la cara o sensacin de calor (sofoco). ? Fiebre o escalofros. ? Dolor de cabeza, de pecho o de espalda. ? Pis (orina) de color oscuro o sangre en el pis. ? Erupcin cutnea generalizada. ? Latidos cardacos acelerados. ? Sentirse mareado o aturdido. Es posible que reciba la transfusin de sangre en un entorno ambulatorio. En tal caso, le indicarn con quin se debe poner en contacto para informar cualquier reaccin. Estos sntomas pueden indicar una emergencia. No espere a ver si los sntomas desaparecen. Solicite atencin mdica de inmediato. Comunquese con el servicio de emergencias de su localidad (911 en los [place]). No conduzca por sus propios medios hasta el hospital. Resumen  Es normal tener moretones y dolor en el lugar donde se coloc la va intravenosa (i.v.).  Controle el lugar de insercin todos los das para detectar signos de infeccin.  Haga reposo como se lo haya indicado el mdico. Retome sus actividades habituales como se lo haya indicado el mdico.  Obtenga ayuda de inmediato si tiene signos de una reaccin grave. Esta informacin no tiene como fin reemplazar el consejo del mdico. Asegrese de hacerle al mdico cualquier pregunta que tenga. Document   Revised: 01/11/2019 Document Reviewed: 01/11/2019 Elsevier Patient Education  2020 Elsevier Inc.  Transfusin de plaquetas Platelet Transfusion La transfusin de plaquetas es un procedimiento en el cual se reciben plaquetas de un donante a travs de una va  intravenosa. Las plaquetas son fragmentos diminutos de clulas sanguneas. Cuando una persona sufre una lesin, las plaquetas se agrupan en la zona lesionada para formar un cogulo de [place]. Esto ayuda a Therapist, music y es el principio del proceso de Editor, commissioning. Si tiene [employer], es posible que su sangre tenga problemas para Biochemist, clinical. Esto puede causar sangrado y moretones con mucha facilidad. Tal vez deba recibir una transfusin de plaquetas si tiene una enfermedad que reduce el nmero de plaquetas (trombocitopenia). Se puede usar una transfusin de plaquetas para detener o evitar el sangrado excesivo. Informe al mdico acerca de lo siguiente:  Las reacciones que haya tenido durante transfusiones previas.  Cualquier alergia que tenga.  Todos los [employer], incluidos vitaminas, hierbas, gotas oftlmicas, cremas y [address] de [address].  Cualquier enfermedad de la sangre que tenga.  Cirugas previas a las que se someti.  Cualquier afeccin mdica que tenga.  Si est embarazada o podra estarlo. Cules son los riesgos? En general, se trata de un procedimiento seguro. Sin embargo, pueden ocurrir complicaciones, por ejemplo:  Grant Ruts.  Infeccin.  Reaccin alrgica a las plaquetas del donante.  Que el sistema de su cuerpo que combate las enfermedades (sistema inmunitario) ataque las plaquetas del donante (reaccin hemoltica). Esto es poco frecuente.  Una reaccin poco frecuente que causa dao pulmonar (lesin pulmonar aguda producida por transfusin). Qu ocurre antes del procedimiento? Medicamentos  Consulte al mdico sobre: ? Multimedia programmer o suspender los medicamentos que toma habitualmente. Esto es muy importante si toma medicamentos para la diabetes o anticoagulantes. ? Tomar medicamentos como aspirina e ibuprofeno. Estos medicamentos pueden tener un efecto anticoagulante en la [place]. No tome estos medicamentos a menos que el mdico se lo  indique. ? Tomar medicamentos de [employer], vitaminas, hierbas y suplementos. Instrucciones generales  Se le realizar un anlisis de sangre para determinar su grupo sanguneo. Su tipo de sangre determina qu tipo de plaquetas recibir.  Siga las indicaciones del mdico respecto de las restricciones para las comidas o las bebidas.  Si tuvo una reaccin alrgica a una transfusin en el pasado, tal vez le administren un medicamento que ayude a evitarla.  Le controlarn la temperatura, la presin arterial, el pulso y la respiracin. Qu ocurre durante el procedimiento?   Le colocarn una va intravenosa en una de las venas.  Por su seguridad, dos mdicos verificarn su identidad y las plaquetas del donante que se le van a infundir.  Una bolsa con las plaquetas del donante se conectar a su va intravenosa. Las plaquetas ingresarn en su torrente sanguneo. Esto generalmente demora entre 30 y 60 minutos.  Durante la transfusin, le controlarn la temperatura, la presin arterial, el pulso y la respiracin. Esto ayuda a detectar signos tempranos de una reaccin.  Tambin lo controlarn para Engineer, manufacturing otros sntomas que puedan indicar una reaccin, como escalofros, urticaria o picazn.  Si tiene signos de Astronomer, se suspender la transfusin y tal vez le administren un medicamento para Animator.  Una vez finalizada la transfusin, se retirar la va intravenosa.  Se puede aplicar presin en el lugar de la va intravenosa (i.v.) para detener cualquier sangrado.  El lugar de la va intravenosa (i.v.) se cubrir con un vendaje. [employer]  procedimiento puede variar segn el mdico y el hospital. Ladell Heads sucede despus del procedimiento?  Le controlarn la presin arterial, la temperatura, el pulso y la respiracin hasta que abandone el hospital o la clnica.  Es posible que tenga algunos moretones y Stage manager en el lugar de la va intravenosa (i.v.). Siga  estas indicaciones en su casa: Medicamentos  Tome los medicamentos de venta libre y los recetados solamente como se lo haya indicado el mdico.  Hable con el mdico antes de tomar cualquier medicamento que contenga aspirina o antiinflamatorios no esteroideos (AINE). Estos medicamentos aumentan el riesgo de hemorragias peligrosas. Instrucciones generales  Cambie o retire Office manager se lo haya indicado el mdico.  Retome sus actividades normales como se lo haya indicado el mdico. Pregntele al mdico qu actividades son seguras para usted.  No tome baos de inmersin, no nade ni use el jacuzzi hasta que el mdico lo autorice. Pregntele al mdico si puede ducharse.  Controle el lugar de la va intravenosa (i.v.) [employer] para descartar signos de infeccin. Est atento a los siguientes signos: ? Dolor, hinchazn o enrojecimiento. ? Lquido o sangre. Si sale lquido o sangre del Environmental consultant de la va intravenosa (i.v.), ejerza presin firme con las manos sobre el vendaje que cubre la zona durante uno o dos minutos. Al hacerlo, debera detenerse el sangrado. ? Calor. ? Pus o mal olor.  Concurra a todas las visitas de control como se lo haya indicado el mdico. Esto es importante. Comunquese con un mdico si tiene:  Dolor de cabeza que no se alivia con medicamentos.  Ronchas, erupcin o picazn en la piel.  Nuseas o vmitos.  Cansancio o debilidad inusuales.  Signos de infeccin en el lugar de la va intravenosa (i.v.). Solicite ayuda de inmediato si:  Tiene fiebre o siente escalofros.  Orina con menos frecuencia que lo habitual.  La orina es de un color ms oscuro que lo normal.  Tiene alguno de los siguientes sntomas: ? Dificultad para Industrial/product designer. ? Dolor en la espalda, el abdomen o el pecho. ? Piel fra y hmeda. ? Latidos cardacos acelerados. Resumen  Las plaquetas son fragmentos diminutos de clulas sanguneas que se agrupan para formar un cogulo de sangre  cuando se produce una herida. Si tiene [employer], es posible que su sangre tenga problemas para Biochemist, clinical.  La transfusin de plaquetas es un procedimiento en el cual se reciben plaquetas de un donante a travs de una va intravenosa.  Se puede usar una transfusin de plaquetas para detener o evitar el sangrado excesivo.  Despus del procedimiento, revise el lugar de la va intravenosa (i.v.) todos los das para detectar signos de infeccin, como enrojecimiento, hinchazn, dolor o calor. Esta informacin no tiene Theme park manager el consejo del mdico. Asegrese de hacerle al mdico cualquier pregunta que tenga. Document Revised: 08/03/2017 Document Reviewed: 08/03/2017 Elsevier Patient Education  2020 Elsevier Inc.  Coronavirus (COVID-19) Are you at risk?  Are you at risk for the Coronavirus (COVID-19)?  To be considered HIGH RISK for Coronavirus (COVID-19), you have to meet the following criteria:  . Traveled to [place], [place], [place], [place] or [place]; or in the [place] to [place], [place], [place], or [place]; and have fever, cough, and shortness of breath within the last 2 weeks of travel OR . Been in close contact with a person diagnosed with COVID-19 within the last 2 weeks and have fever, cough, and shortness of breath . IF YOU  DO NOT MEET THESE CRITERIA, YOU ARE CONSIDERED LOW RISK FOR COVID-19.  What to do if you are HIGH RISK for COVID-19?  Marland Kitchen If you are having a medical emergency, call 911. . Seek medical care right away. Before you go to a doctor's office, urgent care or emergency department, call ahead and tell them about your recent travel, contact with someone diagnosed with COVID-19, and your symptoms. You should receive instructions from your physician's office regarding next steps of care.  . When you arrive at healthcare provider, tell the healthcare staff immediately you have returned from visiting [place], [place], [place], [place] or [place]; or  traveled in the [place] to [place], [place], [place], or [place]; in the last two weeks or you have been in close contact with a person diagnosed with COVID-19 in the last 2 weeks.   . Tell the health care staff about your symptoms: fever, cough and shortness of breath. . After you have been seen by a medical provider, you will be either: o Tested for (COVID-19) and discharged home on quarantine except to seek medical care if symptoms worsen, and asked to  - Stay home and avoid contact with others until you get your results (4-5 days)  - Avoid travel on public transportation if possible (such as bus, train, or airplane) or o Sent to the Emergency Department by EMS for evaluation, COVID-19 testing, and possible admission depending on your condition and test results.  What to do if you are LOW RISK for COVID-19?  Reduce your risk of any infection by using the same precautions used for avoiding the common cold or flu:  Marland Kitchen Wash your hands often with soap and warm water for at least 20 seconds.  If soap and water are not readily available, use an alcohol-based hand sanitizer with at least 60% alcohol.  . If coughing or sneezing, cover your mouth and nose by coughing or sneezing into the elbow areas of your shirt or coat, into a tissue or into your sleeve (not your hands). . Avoid shaking hands with others and consider head nods or verbal greetings only. . Avoid touching your eyes, nose, or mouth with unwashed hands.  . Avoid close contact with people who are sick. . Avoid places or events with large numbers of people in one location, like concerts or sporting events. . Carefully consider travel plans you have or are making. . If you are planning any travel outside or inside the [place], visit the CDC's Travelers' Health webpage for the latest health notices. . If you have some symptoms but not all symptoms, continue to monitor at home and seek medical attention if your symptoms worsen. . If  you are having a medical emergency, call 911.   [place] / e-Visit: [email or web address]         [facility]: [facility]: [phone]                   [facility]: [phone]

## 2019-07-06 NOTE — Addendum Note (Signed)
Addended by: Lonell Face on: 07/06/2019 10:59 AM   Modules accepted: Orders

## 2019-07-07 LAB — PREPARE PLATELET PHERESIS: Unit division: 0

## 2019-07-07 LAB — TYPE AND SCREEN
ABO/RH(D): O POS
Antibody Screen: NEGATIVE
Unit division: 0

## 2019-07-07 LAB — BPAM RBC
Blood Product Expiration Date: 202102242359
ISSUE DATE / TIME: 202101271402
Unit Type and Rh: 5100

## 2019-07-07 LAB — BPAM PLATELET PHERESIS
Blood Product Expiration Date: 202101282359
ISSUE DATE / TIME: 202101271230
Unit Type and Rh: 8400

## 2019-07-08 ENCOUNTER — Inpatient Hospital Stay: Payer: Self-pay

## 2019-07-08 ENCOUNTER — Telehealth: Payer: Self-pay | Admitting: *Deleted

## 2019-07-08 ENCOUNTER — Other Ambulatory Visit: Payer: Self-pay

## 2019-07-08 ENCOUNTER — Other Ambulatory Visit: Payer: Self-pay | Admitting: *Deleted

## 2019-07-08 VITALS — BP 131/92 | HR 87 | Temp 98.5°F | Resp 17

## 2019-07-08 DIAGNOSIS — D61818 Other pancytopenia: Secondary | ICD-10-CM

## 2019-07-08 DIAGNOSIS — Z95828 Presence of other vascular implants and grafts: Secondary | ICD-10-CM

## 2019-07-08 DIAGNOSIS — D619 Aplastic anemia, unspecified: Secondary | ICD-10-CM

## 2019-07-08 LAB — CBC WITH DIFFERENTIAL (CANCER CENTER ONLY)
Abs Immature Granulocytes: 0 10*3/uL (ref 0.00–0.07)
Basophils Absolute: 0 10*3/uL (ref 0.0–0.1)
Basophils Relative: 0 %
Eosinophils Absolute: 0 10*3/uL (ref 0.0–0.5)
Eosinophils Relative: 0 %
HCT: 21.8 % — ABNORMAL LOW (ref 39.0–52.0)
Hemoglobin: 7.4 g/dL — ABNORMAL LOW (ref 13.0–17.0)
Immature Granulocytes: 0 %
Lymphocytes Relative: 36 %
Lymphs Abs: 0.6 10*3/uL — ABNORMAL LOW (ref 0.7–4.0)
MCH: 27.7 pg (ref 26.0–34.0)
MCHC: 33.9 g/dL (ref 30.0–36.0)
MCV: 81.6 fL (ref 80.0–100.0)
Monocytes Absolute: 0.3 10*3/uL (ref 0.1–1.0)
Monocytes Relative: 19 %
Neutro Abs: 0.7 10*3/uL — ABNORMAL LOW (ref 1.7–7.7)
Neutrophils Relative %: 45 %
Platelet Count: 10 10*3/uL — ABNORMAL LOW (ref 150–400)
RBC: 2.67 MIL/uL — ABNORMAL LOW (ref 4.22–5.81)
RDW: 16.7 % — ABNORMAL HIGH (ref 11.5–15.5)
WBC Count: 1.6 10*3/uL — ABNORMAL LOW (ref 4.0–10.5)
nRBC: 0 % (ref 0.0–0.2)

## 2019-07-08 LAB — SAMPLE TO BLOOD BANK

## 2019-07-08 MED ORDER — SODIUM CHLORIDE 0.9% IV SOLUTION
250.0000 mL | Freq: Once | INTRAVENOUS | Status: AC
  Administered 2019-07-08: 250 mL via INTRAVENOUS
  Filled 2019-07-08: qty 250

## 2019-07-08 MED ORDER — HEPARIN SOD (PORK) LOCK FLUSH 100 UNIT/ML IV SOLN
500.0000 [IU] | Freq: Once | INTRAVENOUS | Status: DC
  Filled 2019-07-08: qty 5

## 2019-07-08 MED ORDER — ACETAMINOPHEN 325 MG PO TABS
650.0000 mg | ORAL_TABLET | Freq: Once | ORAL | Status: AC
  Administered 2019-07-08: 10:00:00 650 mg via ORAL

## 2019-07-08 MED ORDER — SODIUM CHLORIDE 0.9% FLUSH
3.0000 mL | INTRAVENOUS | Status: AC | PRN
  Administered 2019-07-08: 12:00:00 3 mL
  Filled 2019-07-08: qty 10

## 2019-07-08 MED ORDER — ACETAMINOPHEN 325 MG PO TABS
ORAL_TABLET | ORAL | Status: AC
  Filled 2019-07-08: qty 2

## 2019-07-08 MED ORDER — HEPARIN SOD (PORK) LOCK FLUSH 100 UNIT/ML IV SOLN
250.0000 [IU] | INTRAVENOUS | Status: AC | PRN
  Administered 2019-07-08: 250 [IU]
  Filled 2019-07-08: qty 5

## 2019-07-08 MED ORDER — SODIUM CHLORIDE 0.9% FLUSH
10.0000 mL | Freq: Once | INTRAVENOUS | Status: AC
  Administered 2019-07-08: 10 mL
  Filled 2019-07-08: qty 10

## 2019-07-08 MED ORDER — DIPHENHYDRAMINE HCL 25 MG PO CAPS
25.0000 mg | ORAL_CAPSULE | Freq: Once | ORAL | Status: AC
  Administered 2019-07-08: 10:00:00 25 mg via ORAL

## 2019-07-08 MED ORDER — SODIUM CHLORIDE 0.9% FLUSH
10.0000 mL | Freq: Once | INTRAVENOUS | Status: DC
  Filled 2019-07-08: qty 10

## 2019-07-08 MED ORDER — DIPHENHYDRAMINE HCL 25 MG PO CAPS
ORAL_CAPSULE | ORAL | Status: AC
  Filled 2019-07-08: qty 2

## 2019-07-08 NOTE — Telephone Encounter (Signed)
TCT cnacer center in [place], [place].  Pt is moving there next week to be with his son. This call is in regards to his transfer of care.  Referral made 07/01/19 TC made to verify receipt of referral and to assist in scheduling pt's visit there. Spoke with Raynelle Fanning.  She states they have received the referral. Advised that pt will be re-locating there at the end of next week. He anticipates being there by the 5th or 6th of February.  Advised that pt speaks only [place].  Advised that pt comes here to [facility] 3 x a week for blood and platelet transfusions. He needs not to have too big of a gap in his care.  Advised that, if all goes according to pt 's plan, his last visit here will be 07/13/19. At that time we will give him 2 units of blood and 2 units of platelets to hopefully tide him over until he get established with care in [place]. Raynelle Fanning advised that a nurse, Lillia Abed, will call here 07/11/19 to confirm appt in their cancer center  Spoke with patient through translation app on his phone and advised him of the above. He voiced understanding. Asked patient how he was getting to [facility]. He states a Barista and social services are helping him.  Scheduling message sent for appts on 07/11/19 and 07/13/19

## 2019-07-08 NOTE — Patient Instructions (Signed)
Trombocitopenia Thrombocytopenia La trombocitopenia es la cantidad baja de plaquetas en la sangre. Las plaquetas son clulas diminutas de la sangre. Cuando uno sangra, se juntan en el lugar del corte o la lesin para detener el sangrado. Esto se llama coagulacin sangunea. Una cantidad insuficiente de plaquetas puede causar problemas de sangrado. Algunos casos de esta afeccin son leves, mientras que otros son ms graves. Cules son las causas? Esta afeccin puede ser causada por lo siguiente:  El cuerpo no produce la cantidad suficiente de plaquetas. Las causas de esto pueden ser las siguientes: ? La mdula sea no produce clulas sanguneas (anemia aplsica). ? Cncer de la mdula sea. ? Ciertos medicamentos. ? Infeccin de la mdula sea. ? Beber mucho alcohol.  El cuerpo destruye las plaquetas demasiado rpido. Las causas de esto pueden ser las siguientes: ? Ciertas enfermedades inmunitarias. ? Ciertos medicamentos. ? Ciertos trastornos de la coagulacin de la sangre. ? Ciertos trastornos que se transmiten de padres a hijos (hereditarios). ? Ciertos trastornos de la coagulacin. ? Embarazo. ? Tener el bazo ms grande de lo normal. Cules son los signos o los sntomas?  Sangrado que no es normal.  Hemorragias nasales.  Perodos menstruales abundantes.  Sangre en el pis (orina) o en la materia fecal (heces).  Piel de color morado (prpura).  Moretones.  Una erupcin con aspecto de puntos de color rojo prpura (petequias). Cmo se trata?  El tratamiento de otra afeccin que est causando el bajo recuento de plaquetas.  Medicamentos para ayudar a proteger las plaquetas y evitar que se destruyan.  Una reposicin (transfusin) de las plaquetas para detener o evitar hemorragias.  Ciruga para extirpar el bazo. Siga estas instrucciones en su casa: Actividad  Evite las actividades que podran causarle lesiones o moretones. Siga las instrucciones acerca de cmo evitar  las cadas.  Tenga cuidado de no cortarse: ? Cuando se afeita. ? Cuando usa tijeras, agujas, cuchillos u otras herramientas.  Tenga cuidado de no quemarse: ? Cuando usa la plancha. ? Cuando cocina. Instrucciones generales   Controle la piel y la parte de adentro de la boca para detectar moretones o sangre como se lo haya indicado el mdico.  Verifique que no haya sangre en la saliva (esputo), la orina y las heces. Hgalo como se lo haya indicado el mdico.  No beba alcohol.  Tome los medicamentos de venta libre y los recetados solamente como se lo haya indicado el mdico.  No tome medicamentos que contengan aspirina o antiinflamatorios no esteroideos (AINE). Estos medicamentos pueden tener un efecto anticoagulante en la sangre y causarle sangrado.  Informe a todos sus mdicos que tiene esta afeccin. Asegrese de decirles tambin a su dentista y a su oftalmlogo. Comunquese con un mdico si:  Tiene moretones y no sabe por qu. Solicite ayuda inmediatamente si:  Tiene una hemorragia en cualquier lugar del cuerpo.  Observa sangre en la saliva, la orina o las heces. Resumen  La trombocitopenia es la cantidad baja de plaquetas en la sangre.  Las plaquetas son necesarias para la coagulacin de la sangre.  Los sntomas de esta afeccin incluyen sangrado que no es normal y moretones.  Tenga cuidado de no cortarse ni quemarse. Esta informacin no tiene como fin reemplazar el consejo del mdico. Asegrese de hacerle al mdico cualquier pregunta que tenga. Document Revised: 04/12/2018 Document Reviewed: 04/12/2018 Elsevier Patient Education  2020 Elsevier Inc.  

## 2019-07-09 LAB — PREPARE PLATELET PHERESIS: Unit division: 0

## 2019-07-09 LAB — BPAM PLATELET PHERESIS
Blood Product Expiration Date: 202101302359
ISSUE DATE / TIME: 202101291039
Unit Type and Rh: 5100

## 2019-07-11 ENCOUNTER — Other Ambulatory Visit: Payer: Self-pay

## 2019-07-11 ENCOUNTER — Inpatient Hospital Stay: Payer: Self-pay

## 2019-07-11 ENCOUNTER — Inpatient Hospital Stay (HOSPITAL_BASED_OUTPATIENT_CLINIC_OR_DEPARTMENT_OTHER): Payer: Self-pay | Admitting: Medical

## 2019-07-11 ENCOUNTER — Other Ambulatory Visit: Payer: Self-pay | Admitting: *Deleted

## 2019-07-11 ENCOUNTER — Inpatient Hospital Stay: Payer: Self-pay | Attending: Hematology and Oncology

## 2019-07-11 DIAGNOSIS — D619 Aplastic anemia, unspecified: Secondary | ICD-10-CM

## 2019-07-11 DIAGNOSIS — D61818 Other pancytopenia: Secondary | ICD-10-CM

## 2019-07-11 DIAGNOSIS — L0291 Cutaneous abscess, unspecified: Secondary | ICD-10-CM

## 2019-07-11 DIAGNOSIS — R5383 Other fatigue: Secondary | ICD-10-CM | POA: Insufficient documentation

## 2019-07-11 DIAGNOSIS — R7881 Bacteremia: Secondary | ICD-10-CM | POA: Insufficient documentation

## 2019-07-11 DIAGNOSIS — Z95828 Presence of other vascular implants and grafts: Secondary | ICD-10-CM

## 2019-07-11 DIAGNOSIS — Z7952 Long term (current) use of systemic steroids: Secondary | ICD-10-CM | POA: Insufficient documentation

## 2019-07-11 DIAGNOSIS — Z79899 Other long term (current) drug therapy: Secondary | ICD-10-CM | POA: Insufficient documentation

## 2019-07-11 LAB — CBC WITH DIFFERENTIAL (CANCER CENTER ONLY)
Abs Immature Granulocytes: 0.01 10*3/uL (ref 0.00–0.07)
Basophils Absolute: 0 10*3/uL (ref 0.0–0.1)
Basophils Relative: 0 %
Eosinophils Absolute: 0 10*3/uL (ref 0.0–0.5)
Eosinophils Relative: 0 %
HCT: 20.8 % — ABNORMAL LOW (ref 39.0–52.0)
Hemoglobin: 6.8 g/dL — CL (ref 13.0–17.0)
Immature Granulocytes: 1 %
Lymphocytes Relative: 35 %
Lymphs Abs: 0.7 10*3/uL (ref 0.7–4.0)
MCH: 27.5 pg (ref 26.0–34.0)
MCHC: 32.7 g/dL (ref 30.0–36.0)
MCV: 84.2 fL (ref 80.0–100.0)
Monocytes Absolute: 0.3 10*3/uL (ref 0.1–1.0)
Monocytes Relative: 15 %
Neutro Abs: 1 10*3/uL — ABNORMAL LOW (ref 1.7–7.7)
Neutrophils Relative %: 49 %
Platelet Count: 11 10*3/uL — ABNORMAL LOW (ref 150–400)
RBC: 2.47 MIL/uL — ABNORMAL LOW (ref 4.22–5.81)
RDW: 17.2 % — ABNORMAL HIGH (ref 11.5–15.5)
WBC Count: 2.1 10*3/uL — ABNORMAL LOW (ref 4.0–10.5)
nRBC: 0 % (ref 0.0–0.2)

## 2019-07-11 LAB — SAMPLE TO BLOOD BANK

## 2019-07-11 LAB — PREPARE RBC (CROSSMATCH)

## 2019-07-11 MED ORDER — SODIUM CHLORIDE 0.9% FLUSH
10.0000 mL | Freq: Once | INTRAVENOUS | Status: AC
  Administered 2019-07-11: 11:00:00 10 mL
  Filled 2019-07-11: qty 10

## 2019-07-11 MED ORDER — SODIUM CHLORIDE 0.9% FLUSH
3.0000 mL | INTRAVENOUS | Status: AC | PRN
  Administered 2019-07-11: 16:00:00 3 mL
  Filled 2019-07-11: qty 10

## 2019-07-11 MED ORDER — SULFAMETHOXAZOLE-TRIMETHOPRIM 800-160 MG PO TABS: 1.0000 | ORAL_TABLET | Freq: Two times a day (BID) | ORAL | 0 refills | Status: DC

## 2019-07-11 MED ORDER — HEPARIN SOD (PORK) LOCK FLUSH 100 UNIT/ML IV SOLN
250.0000 [IU] | INTRAVENOUS | Status: AC | PRN
  Administered 2019-07-11: 16:00:00 250 [IU]
  Filled 2019-07-11: qty 5

## 2019-07-11 MED ORDER — DIPHENHYDRAMINE HCL 25 MG PO CAPS
ORAL_CAPSULE | ORAL | Status: AC
  Filled 2019-07-11: qty 1

## 2019-07-11 MED ORDER — SODIUM CHLORIDE 0.9% IV SOLUTION
250.0000 mL | Freq: Once | INTRAVENOUS | Status: AC
  Administered 2019-07-11: 13:00:00 250 mL via INTRAVENOUS
  Filled 2019-07-11: qty 250

## 2019-07-11 MED ORDER — DIPHENHYDRAMINE HCL 25 MG PO CAPS
25.0000 mg | ORAL_CAPSULE | Freq: Once | ORAL | Status: AC
  Administered 2019-07-11: 25 mg via ORAL

## 2019-07-11 MED ORDER — HEPARIN SOD (PORK) LOCK FLUSH 100 UNIT/ML IV SOLN
500.0000 [IU] | Freq: Once | INTRAVENOUS | Status: AC
  Administered 2019-07-11: 500 [IU]
  Filled 2019-07-11: qty 5

## 2019-07-11 NOTE — Patient Instructions (Signed)
Transfusin de sangre en los adultos, cuidados posteriores Blood Transfusion, Adult, Care After Esta hoja le brinda informacin sobre cmo cuidarse despus del procedimiento. El mdico tambin podr darle instrucciones ms especficas. Si tiene problemas o preguntas, llame al mdico. Qu puedo esperar despus del procedimiento? Despus del procedimiento, es normal tener los siguientes sntomas:  Hematomas y dolor en el lugar de la va intravenosa (i.v.).  Fiebre o escalofros el da del procedimiento. Esta puede ser la respuesta del cuerpo a las nuevas clulas sanguneas recibidas.  Dolor de cabeza. Siga estas instrucciones en su casa: Cuidados del lugar de la insercin      Siga las instrucciones del mdico en lo que respecta al cuidado del lugar de insercin. Este es el lugar donde se coloc un tubo (catter) intravenoso en la vena. Asegrese de hacer lo siguiente: ? Lvese las manos con agua y jabn antes y despus de cambiar la venda (vendaje). Use un desinfectante para manos si no dispone de agua y jabn. ? Cambie las vendas como se lo haya indicado el mdico.  Controle el lugar de insercin todos los das para detectar signos de infeccin. Preste atencin a los siguientes signos: ? Dolor, hinchazn o enrojecimiento. ? Sangrado proveniente del lugar. ? Calor. ? Pus o mal olor. Instrucciones generales  Use los medicamentos de venta libre y los recetados solamente como se lo haya indicado el mdico.  Haga reposo como se lo haya indicado el mdico.  Retome sus actividades habituales como se lo haya indicado el mdico.  Concurra a todas las visitas de seguimiento como se lo haya indicado el mdico. Esto es importante. Comunquese con un mdico si:  Tiene picazn o zonas enrojecidas e hinchadas en la piel (urticaria).  Est preocupado o nervioso (ansioso).  Se siente dbil despus de realizar sus actividades habituales.  Tiene enrojecimiento, hinchazn, calor o dolor  alrededor del lugar de la insercin.  Observa sangre que sale del lugar de la insercin que no se detiene al ejercer presin.  Tiene pus o percibe mal olor que proviene del lugar de la insercin. Solicite ayuda de inmediato si:  Tiene signos de una reaccin grave. Puede deberse a una alergia o provenir del sistema de defensa del cuerpo (sistema inmunitario). Algunos signos son los siguientes: ? Dificultad para respirar o falta de aire. ? Hinchazn en la cara o sensacin de calor (sofoco). ? Fiebre o escalofros. ? Dolor de cabeza, de pecho o de espalda. ? Pis (orina) de color oscuro o sangre en el pis. ? Erupcin cutnea generalizada. ? Latidos cardacos acelerados. ? Sentirse mareado o aturdido. Es posible que reciba la transfusin de sangre en un entorno ambulatorio. En tal caso, le indicarn con quin se debe poner en contacto para informar cualquier reaccin. Estos sntomas pueden indicar una emergencia. No espere a ver si los sntomas desaparecen. Solicite atencin mdica de inmediato. Comunquese con el servicio de emergencias de su localidad (911 en los [place]). No conduzca por sus propios medios hasta el hospital. Resumen  Es normal tener moretones y dolor en el lugar donde se coloc la va intravenosa (i.v.).  Controle el lugar de insercin todos los das para detectar signos de infeccin.  Haga reposo como se lo haya indicado el mdico. Retome sus actividades habituales como se lo haya indicado el mdico.  Obtenga ayuda de inmediato si tiene signos de una reaccin grave. Esta informacin no tiene como fin reemplazar el consejo del mdico. Asegrese de hacerle al mdico cualquier pregunta que tenga. Document   Revised: 01/11/2019 Document Reviewed: 01/11/2019 Elsevier Patient Education  2020 Elsevier Inc.  

## 2019-07-12 LAB — BPAM RBC
Blood Product Expiration Date: 202102282359
ISSUE DATE / TIME: 202102011325
Unit Type and Rh: 5100

## 2019-07-12 LAB — TYPE AND SCREEN
ABO/RH(D): O POS
Antibody Screen: NEGATIVE
Unit division: 0

## 2019-07-13 ENCOUNTER — Telehealth: Payer: Self-pay | Admitting: *Deleted

## 2019-07-13 ENCOUNTER — Inpatient Hospital Stay: Payer: Self-pay

## 2019-07-13 ENCOUNTER — Other Ambulatory Visit: Payer: Self-pay | Admitting: *Deleted

## 2019-07-13 ENCOUNTER — Inpatient Hospital Stay (HOSPITAL_BASED_OUTPATIENT_CLINIC_OR_DEPARTMENT_OTHER): Payer: Self-pay | Admitting: Medical

## 2019-07-13 ENCOUNTER — Other Ambulatory Visit: Payer: Self-pay

## 2019-07-13 DIAGNOSIS — D61818 Other pancytopenia: Secondary | ICD-10-CM

## 2019-07-13 DIAGNOSIS — D619 Aplastic anemia, unspecified: Secondary | ICD-10-CM

## 2019-07-13 DIAGNOSIS — L0291 Cutaneous abscess, unspecified: Secondary | ICD-10-CM

## 2019-07-13 DIAGNOSIS — Z95828 Presence of other vascular implants and grafts: Secondary | ICD-10-CM

## 2019-07-13 LAB — CBC WITH DIFFERENTIAL (CANCER CENTER ONLY)
Abs Immature Granulocytes: 0.02 10*3/uL (ref 0.00–0.07)
Basophils Absolute: 0 10*3/uL (ref 0.0–0.1)
Basophils Relative: 1 %
Eosinophils Absolute: 0 10*3/uL (ref 0.0–0.5)
Eosinophils Relative: 0 %
HCT: 23.1 % — ABNORMAL LOW (ref 39.0–52.0)
Hemoglobin: 7.8 g/dL — ABNORMAL LOW (ref 13.0–17.0)
Immature Granulocytes: 1 %
Lymphocytes Relative: 30 %
Lymphs Abs: 0.6 10*3/uL — ABNORMAL LOW (ref 0.7–4.0)
MCH: 28.2 pg (ref 26.0–34.0)
MCHC: 33.8 g/dL (ref 30.0–36.0)
MCV: 83.4 fL (ref 80.0–100.0)
Monocytes Absolute: 0.4 10*3/uL (ref 0.1–1.0)
Monocytes Relative: 17 %
Neutro Abs: 1.1 10*3/uL — ABNORMAL LOW (ref 1.7–7.7)
Neutrophils Relative %: 51 %
Platelet Count: <5 10*3/uL — CL (ref 150–400)
RBC: 2.77 MIL/uL — ABNORMAL LOW (ref 4.22–5.81)
RDW: 16.5 % — ABNORMAL HIGH (ref 11.5–15.5)
WBC Count: 2.1 10*3/uL — ABNORMAL LOW (ref 4.0–10.5)
nRBC: 0 % (ref 0.0–0.2)

## 2019-07-13 LAB — SAMPLE TO BLOOD BANK

## 2019-07-13 MED ORDER — SODIUM CHLORIDE 0.9% FLUSH
10.0000 mL | Freq: Once | INTRAVENOUS | Status: AC
  Administered 2019-07-13: 12:00:00 10 mL
  Filled 2019-07-13: qty 10

## 2019-07-13 MED ORDER — SODIUM CHLORIDE 0.9% IV SOLUTION
250.0000 mL | Freq: Once | INTRAVENOUS | Status: AC
  Administered 2019-07-13: 12:00:00 250 mL via INTRAVENOUS
  Filled 2019-07-13: qty 250

## 2019-07-13 MED ORDER — SODIUM CHLORIDE 0.9% IV SOLUTION
250.0000 mL | Freq: Once | INTRAVENOUS | Status: DC
  Filled 2019-07-13: qty 250

## 2019-07-13 MED ORDER — HEPARIN SOD (PORK) LOCK FLUSH 100 UNIT/ML IV SOLN
250.0000 [IU] | INTRAVENOUS | Status: AC | PRN
  Administered 2019-07-13: 250 [IU]
  Filled 2019-07-13: qty 5

## 2019-07-13 MED ORDER — ACETAMINOPHEN 325 MG PO TABS
650.0000 mg | ORAL_TABLET | Freq: Once | ORAL | Status: AC
  Administered 2019-07-13: 13:00:00 650 mg via ORAL

## 2019-07-13 MED ORDER — DIPHENHYDRAMINE HCL 25 MG PO CAPS
ORAL_CAPSULE | ORAL | Status: AC
  Filled 2019-07-13: qty 1

## 2019-07-13 MED ORDER — DIPHENHYDRAMINE HCL 25 MG PO CAPS
25.0000 mg | ORAL_CAPSULE | Freq: Once | ORAL | Status: AC
  Administered 2019-07-13: 13:00:00 25 mg via ORAL

## 2019-07-13 MED ORDER — SODIUM CHLORIDE 0.9% FLUSH
3.0000 mL | INTRAVENOUS | Status: AC | PRN
  Administered 2019-07-13: 14:00:00 3 mL
  Filled 2019-07-13: qty 10

## 2019-07-13 MED ORDER — ACETAMINOPHEN 325 MG PO TABS
ORAL_TABLET | ORAL | Status: AC
  Filled 2019-07-13: qty 2

## 2019-07-13 NOTE — Patient Instructions (Signed)

## 2019-07-13 NOTE — Progress Notes (Signed)
Complaining of a boil on buttock. Marga Hoots, PA will come to evaluate.

## 2019-07-13 NOTE — Telephone Encounter (Signed)
Spoke with patient while in infusion. I had learned from [place], our interpretor, that pt will not be leaving for [facility] until 07/24/19. Spoke with patient and advised that arrangements have been made at the [facility] in [place], [place] for his appointment there on 07/28/19 @ 1pm. Provided written information for this cancer center as well as phone number. Advised patient that his appts here for 2/5 and next week will be set up. We used the translator on his phone. He voiced understanding.  Pt will be traveling by train and will need Covid test prior to leaving. Provided information to him for testing.

## 2019-07-13 NOTE — Patient Instructions (Signed)
Transfusin de plaquetas Platelet Transfusion La transfusin de plaquetas es un procedimiento en el cual se reciben plaquetas de un donante a travs de una va intravenosa. Las plaquetas son fragmentos diminutos de clulas sanguneas. Cuando una persona sufre una lesin, las plaquetas se agrupan en la zona lesionada para formar un cogulo de sangre. Esto ayuda a detener el sangrado y es el principio del proceso de cicatrizacin. Si tiene muy pocas plaquetas, es posible que su sangre tenga problemas para coagular. Esto puede causar sangrado y moretones con mucha facilidad. Tal vez deba recibir una transfusin de plaquetas si tiene una enfermedad que reduce el nmero de plaquetas (trombocitopenia). Se puede usar una transfusin de plaquetas para detener o evitar el sangrado excesivo. Informe al mdico acerca de lo siguiente:  Las reacciones que haya tenido durante transfusiones previas.  Cualquier alergia que tenga.  Todos los medicamentos que utiliza, incluidos vitaminas, hierbas, gotas oftlmicas, cremas y medicamentos de venta libre.  Cualquier enfermedad de la sangre que tenga.  Cirugas previas a las que se someti.  Cualquier afeccin mdica que tenga.  Si est embarazada o podra estarlo. Cules son los riesgos? En general, se trata de un procedimiento seguro. Sin embargo, pueden ocurrir complicaciones, por ejemplo:  Fiebre.  Infeccin.  Reaccin alrgica a las plaquetas del donante.  Que el sistema de su cuerpo que combate las enfermedades (sistema inmunitario) ataque las plaquetas del donante (reaccin hemoltica). Esto es poco frecuente.  Una reaccin poco frecuente que causa dao pulmonar (lesin pulmonar aguda producida por transfusin). Qu ocurre antes del procedimiento? Medicamentos  Consulte al mdico sobre: ? Cambiar o suspender los medicamentos que toma habitualmente. Esto es muy importante si toma medicamentos para la diabetes o anticoagulantes. ? Tomar  medicamentos como aspirina e ibuprofeno. Estos medicamentos pueden tener un efecto anticoagulante en la sangre. No tome estos medicamentos a menos que el mdico se lo indique. ? Tomar medicamentos de venta libre, vitaminas, hierbas y suplementos. Instrucciones generales  Se le realizar un anlisis de sangre para determinar su grupo sanguneo. Su tipo de sangre determina qu tipo de plaquetas recibir.  Siga las indicaciones del mdico respecto de las restricciones para las comidas o las bebidas.  Si tuvo una reaccin alrgica a una transfusin en el pasado, tal vez le administren un medicamento que ayude a evitarla.  Le controlarn la temperatura, la presin arterial, el pulso y la respiracin. Qu ocurre durante el procedimiento?   Le colocarn una va intravenosa en una de las venas.  Por su seguridad, dos mdicos verificarn su identidad y las plaquetas del donante que se le van a infundir.  Una bolsa con las plaquetas del donante se conectar a su va intravenosa. Las plaquetas ingresarn en su torrente sanguneo. Esto generalmente demora entre 30 y 60 minutos.  Durante la transfusin, le controlarn la temperatura, la presin arterial, el pulso y la respiracin. Esto ayuda a detectar signos tempranos de una reaccin.  Tambin lo controlarn para detectar otros sntomas que puedan indicar una reaccin, como escalofros, urticaria o picazn.  Si tiene signos de una reaccin en algn momento, se suspender la transfusin y tal vez le administren un medicamento para controlar la reaccin.  Una vez finalizada la transfusin, se retirar la va intravenosa.  Se puede aplicar presin en el lugar de la va intravenosa (i.v.) para detener cualquier sangrado.  El lugar de la va intravenosa (i.v.) se cubrir con un vendaje. Este procedimiento puede variar segn el mdico y el hospital. Qu sucede despus del procedimiento?    Le controlarn la presin arterial, la temperatura, el pulso  y la respiracin hasta que abandone el hospital o la clnica.  Es posible que tenga algunos moretones y Stage manager en el lugar de la va intravenosa (i.v.). Siga estas indicaciones en su casa: Medicamentos  Tome los medicamentos de venta libre y los recetados solamente como se lo haya indicado el mdico.  Hable con el mdico antes de tomar cualquier medicamento que contenga aspirina o antiinflamatorios no esteroideos (AINE). Estos medicamentos aumentan el riesgo de hemorragias peligrosas. Instrucciones generales  Cambie o retire Office manager se lo haya indicado el mdico.  Retome sus actividades normales como se lo haya indicado el mdico. Pregntele al mdico qu actividades son seguras para usted.  No tome baos de inmersin, no nade ni use el jacuzzi hasta que el mdico lo autorice. Pregntele al mdico si puede ducharse.  Controle el lugar de la va intravenosa (i.v.) [employer] para descartar signos de infeccin. Est atento a los siguientes signos: ? Dolor, hinchazn o enrojecimiento. ? Lquido o sangre. Si sale lquido o sangre del Environmental consultant de la va intravenosa (i.v.), ejerza presin firme con las manos sobre el vendaje que cubre la zona durante uno o dos minutos. Al hacerlo, debera detenerse el sangrado. ? Calor. ? Pus o mal olor.  Concurra a todas las visitas de control como se lo haya indicado el mdico. Esto es importante. Comunquese con un mdico si tiene:  Dolor de cabeza que no se alivia con medicamentos.  Ronchas, erupcin o picazn en la piel.  Nuseas o vmitos.  Cansancio o debilidad inusuales.  Signos de infeccin en el lugar de la va intravenosa (i.v.). Solicite ayuda de inmediato si:  Tiene fiebre o siente escalofros.  Orina con menos frecuencia que lo habitual.  La orina es de un color ms oscuro que lo normal.  Tiene alguno de los siguientes sntomas: ? Dificultad para Industrial/product designer. ? Dolor en la espalda, el abdomen o el pecho. ? Piel fra y  hmeda. ? Latidos cardacos acelerados. Resumen  Las plaquetas son fragmentos diminutos de clulas sanguneas que se agrupan para formar un cogulo de sangre cuando se produce una herida. Si tiene [employer], es posible que su sangre tenga problemas para Biochemist, clinical.  La transfusin de plaquetas es un procedimiento en el cual se reciben plaquetas de un donante a travs de una va intravenosa.  Se puede usar una transfusin de plaquetas para detener o evitar el sangrado excesivo.  Despus del procedimiento, revise el lugar de la va intravenosa (i.v.) todos los das para detectar signos de infeccin, como enrojecimiento, hinchazn, dolor o calor. Esta informacin no tiene Theme park manager el consejo del mdico. Asegrese de hacerle al mdico cualquier pregunta que tenga. Document Revised: 08/03/2017 Document Reviewed: 08/03/2017 Elsevier Patient Education  2020 [employer].

## 2019-07-13 NOTE — Progress Notes (Signed)
The patient is seen by Dr. Jeanie Sewer for aplastic anemia.  He was receiving a transfusion of 1 today when epigastric to be seen in the infusion room.  He had reported a painful lesion in his right lower mid buttock.  He was noted that the patient did have what appeared to be a developing abscess.  The area was indurated and slightly tender but was without erythema, warmth, or fluctuance.  He had recently been treated with amoxicillin for a dental infection and his right upper posterior mouth.  He has completed this prescription.  He was given a prescription for Bactrim DS 1 p.o. twice daily x7 days today.  Marga Hoots, MHS, PA-C Physician Assistant

## 2019-07-14 LAB — PREPARE PLATELET PHERESIS: Unit division: 0

## 2019-07-14 LAB — BPAM PLATELET PHERESIS
Blood Product Expiration Date: 202102052359
ISSUE DATE / TIME: 202102031254
Unit Type and Rh: 7300

## 2019-07-14 NOTE — Progress Notes (Signed)
The patient was seen earlier this week for an abscess of his right posterior buttock.  The area was examined again.  It remains indurated.  He continues on Bactrim DS p.o. twice daily.  The area appears to be coming to a head.  He was told to continue using warm compresses to the area and to complete antibiotics.  Marga Hoots, MHS, PA-C Physician Assistant

## 2019-07-15 ENCOUNTER — Inpatient Hospital Stay: Payer: Self-pay

## 2019-07-15 ENCOUNTER — Other Ambulatory Visit: Payer: Self-pay

## 2019-07-15 ENCOUNTER — Inpatient Hospital Stay (HOSPITAL_BASED_OUTPATIENT_CLINIC_OR_DEPARTMENT_OTHER): Payer: Self-pay | Admitting: Medical

## 2019-07-15 DIAGNOSIS — D619 Aplastic anemia, unspecified: Secondary | ICD-10-CM

## 2019-07-15 DIAGNOSIS — D61818 Other pancytopenia: Secondary | ICD-10-CM

## 2019-07-15 DIAGNOSIS — L0291 Cutaneous abscess, unspecified: Secondary | ICD-10-CM

## 2019-07-15 DIAGNOSIS — Z452 Encounter for adjustment and management of vascular access device: Secondary | ICD-10-CM

## 2019-07-15 DIAGNOSIS — Z95828 Presence of other vascular implants and grafts: Secondary | ICD-10-CM

## 2019-07-15 LAB — CBC WITH DIFFERENTIAL (CANCER CENTER ONLY)
Abs Immature Granulocytes: 0.01 10*3/uL (ref 0.00–0.07)
Basophils Absolute: 0 10*3/uL (ref 0.0–0.1)
Basophils Relative: 0 %
Eosinophils Absolute: 0 10*3/uL (ref 0.0–0.5)
Eosinophils Relative: 0 %
HCT: 21 % — ABNORMAL LOW (ref 39.0–52.0)
Hemoglobin: 7 g/dL — ABNORMAL LOW (ref 13.0–17.0)
Immature Granulocytes: 1 %
Lymphocytes Relative: 32 %
Lymphs Abs: 0.5 10*3/uL — ABNORMAL LOW (ref 0.7–4.0)
MCH: 28.2 pg (ref 26.0–34.0)
MCHC: 33.3 g/dL (ref 30.0–36.0)
MCV: 84.7 fL (ref 80.0–100.0)
Monocytes Absolute: 0.3 10*3/uL (ref 0.1–1.0)
Monocytes Relative: 17 %
Neutro Abs: 0.8 10*3/uL — ABNORMAL LOW (ref 1.7–7.7)
Neutrophils Relative %: 50 %
Platelet Count: 17 10*3/uL — ABNORMAL LOW (ref 150–400)
RBC: 2.48 MIL/uL — ABNORMAL LOW (ref 4.22–5.81)
RDW: 17 % — ABNORMAL HIGH (ref 11.5–15.5)
WBC Count: 1.6 10*3/uL — ABNORMAL LOW (ref 4.0–10.5)
nRBC: 0 % (ref 0.0–0.2)

## 2019-07-15 LAB — SAMPLE TO BLOOD BANK

## 2019-07-15 LAB — PREPARE RBC (CROSSMATCH)

## 2019-07-15 MED ORDER — HEPARIN SOD (PORK) LOCK FLUSH 100 UNIT/ML IV SOLN
250.0000 [IU] | INTRAVENOUS | Status: AC | PRN
  Administered 2019-07-15: 17:00:00 250 [IU]
  Filled 2019-07-15: qty 5

## 2019-07-15 MED ORDER — ACETAMINOPHEN 325 MG PO TABS
650.0000 mg | ORAL_TABLET | Freq: Once | ORAL | Status: AC
  Administered 2019-07-15: 650 mg via ORAL

## 2019-07-15 MED ORDER — SODIUM CHLORIDE 0.9% FLUSH
10.0000 mL | Freq: Once | INTRAVENOUS | Status: AC
  Administered 2019-07-15: 12:00:00 10 mL
  Filled 2019-07-15: qty 10

## 2019-07-15 MED ORDER — DIPHENHYDRAMINE HCL 25 MG PO CAPS
25.0000 mg | ORAL_CAPSULE | Freq: Once | ORAL | Status: AC
  Administered 2019-07-15: 25 mg via ORAL

## 2019-07-15 MED ORDER — SODIUM CHLORIDE 0.9% FLUSH
3.0000 mL | INTRAVENOUS | Status: AC | PRN
  Administered 2019-07-15: 17:00:00 3 mL
  Filled 2019-07-15: qty 10

## 2019-07-15 MED ORDER — SODIUM CHLORIDE 0.9% IV SOLUTION
250.0000 mL | Freq: Once | INTRAVENOUS | Status: AC
  Administered 2019-07-15: 13:00:00 250 mL via INTRAVENOUS
  Filled 2019-07-15: qty 250

## 2019-07-15 MED ORDER — DIPHENHYDRAMINE HCL 25 MG PO CAPS
ORAL_CAPSULE | ORAL | Status: AC
  Filled 2019-07-15: qty 1

## 2019-07-15 MED ORDER — ACETAMINOPHEN 325 MG PO TABS
ORAL_TABLET | ORAL | Status: AC
  Filled 2019-07-15: qty 2

## 2019-07-15 NOTE — Progress Notes (Signed)
Patient stated through interpreter that he just had his dressing change this week so I put his next dressing change in as 07/18/19

## 2019-07-15 NOTE — Patient Instructions (Signed)
Transfusin de sangre en los adultos, cuidados posteriores Blood Transfusion, Adult, Care After Esta hoja le brinda informacin sobre cmo cuidarse despus del procedimiento. El mdico tambin podr darle instrucciones ms especficas. Si tiene problemas o preguntas, llame al mdico. Qu puedo esperar despus del procedimiento? Despus del procedimiento, es normal tener los siguientes sntomas:  Hematomas y dolor en el lugar de la va intravenosa (i.v.).  Fiebre o escalofros el da del procedimiento. Esta puede ser la respuesta del cuerpo a las nuevas clulas sanguneas recibidas.  Dolor de cabeza. Siga estas instrucciones en su casa: Cuidados del lugar de la insercin      Siga las instrucciones del mdico en lo que respecta al cuidado del lugar de insercin. Este es el lugar donde se coloc un tubo (catter) intravenoso en la vena. Asegrese de hacer lo siguiente: ? Lvese las manos con agua y jabn antes y despus de cambiar la venda (vendaje). Use un desinfectante para manos si no dispone de agua y jabn. ? Cambie las vendas como se lo haya indicado el mdico.  Controle el lugar de insercin todos los das para detectar signos de infeccin. Preste atencin a los siguientes signos: ? Dolor, hinchazn o enrojecimiento. ? Sangrado proveniente del lugar. ? Calor. ? Pus o mal olor. Instrucciones generales  Use los medicamentos de venta libre y los recetados solamente como se lo haya indicado el mdico.  Haga reposo como se lo haya indicado el mdico.  Retome sus actividades habituales como se lo haya indicado el mdico.  Concurra a todas las visitas de seguimiento como se lo haya indicado el mdico. Esto es importante. Comunquese con un mdico si:  Tiene picazn o zonas enrojecidas e hinchadas en la piel (urticaria).  Est preocupado o nervioso (ansioso).  Se siente dbil despus de realizar sus actividades habituales.  Tiene enrojecimiento, hinchazn, calor o dolor  alrededor del lugar de la insercin.  Observa sangre que sale del lugar de la insercin que no se detiene al ejercer presin.  Tiene pus o percibe mal olor que proviene del lugar de la insercin. Solicite ayuda de inmediato si:  Tiene signos de una reaccin grave. Puede deberse a una alergia o provenir del sistema de defensa del cuerpo (sistema inmunitario). Algunos signos son los siguientes: ? Dificultad para respirar o falta de aire. ? Hinchazn en la cara o sensacin de calor (sofoco). ? Fiebre o escalofros. ? Dolor de cabeza, de pecho o de espalda. ? Pis (orina) de color oscuro o sangre en el pis. ? Erupcin cutnea generalizada. ? Latidos cardacos acelerados. ? Sentirse mareado o aturdido. Es posible que reciba la transfusin de sangre en un entorno ambulatorio. En tal caso, le indicarn con quin se debe poner en contacto para informar cualquier reaccin. Estos sntomas pueden indicar una emergencia. No espere a ver si los sntomas desaparecen. Solicite atencin mdica de inmediato. Comunquese con el servicio de emergencias de su localidad (911 en los [place]). No conduzca por sus propios medios hasta el hospital. Resumen  Es normal tener moretones y dolor en el lugar donde se coloc la va intravenosa (i.v.).  Controle el lugar de insercin todos los das para detectar signos de infeccin.  Haga reposo como se lo haya indicado el mdico. Retome sus actividades habituales como se lo haya indicado el mdico.  Obtenga ayuda de inmediato si tiene signos de una reaccin grave. Esta informacin no tiene como fin reemplazar el consejo del mdico. Asegrese de hacerle al mdico cualquier pregunta que tenga. Document   Revised: 01/11/2019 Document Reviewed: 01/11/2019 Elsevier Patient Education  2020 Elsevier Inc.  

## 2019-07-16 LAB — TYPE AND SCREEN
ABO/RH(D): O POS
Antibody Screen: NEGATIVE
Unit division: 0

## 2019-07-16 LAB — BPAM RBC
Blood Product Expiration Date: 202103102359
ISSUE DATE / TIME: 202102051410
Unit Type and Rh: 5100

## 2019-07-18 ENCOUNTER — Inpatient Hospital Stay (HOSPITAL_BASED_OUTPATIENT_CLINIC_OR_DEPARTMENT_OTHER): Payer: Self-pay | Admitting: Medical

## 2019-07-18 ENCOUNTER — Other Ambulatory Visit: Payer: Self-pay

## 2019-07-18 ENCOUNTER — Other Ambulatory Visit: Payer: Self-pay | Admitting: Medical

## 2019-07-18 ENCOUNTER — Inpatient Hospital Stay: Payer: Self-pay

## 2019-07-18 ENCOUNTER — Other Ambulatory Visit: Payer: Self-pay | Admitting: *Deleted

## 2019-07-18 DIAGNOSIS — D61818 Other pancytopenia: Secondary | ICD-10-CM

## 2019-07-18 DIAGNOSIS — Z95828 Presence of other vascular implants and grafts: Secondary | ICD-10-CM

## 2019-07-18 DIAGNOSIS — L0291 Cutaneous abscess, unspecified: Secondary | ICD-10-CM

## 2019-07-18 DIAGNOSIS — D619 Aplastic anemia, unspecified: Secondary | ICD-10-CM

## 2019-07-18 LAB — SAMPLE TO BLOOD BANK

## 2019-07-18 LAB — CBC WITH DIFFERENTIAL (CANCER CENTER ONLY)
Abs Immature Granulocytes: 0.01 10*3/uL (ref 0.00–0.07)
Basophils Absolute: 0 10*3/uL (ref 0.0–0.1)
Basophils Relative: 0 %
Eosinophils Absolute: 0 10*3/uL (ref 0.0–0.5)
Eosinophils Relative: 1 %
HCT: 21.7 % — ABNORMAL LOW (ref 39.0–52.0)
Hemoglobin: 7.3 g/dL — ABNORMAL LOW (ref 13.0–17.0)
Immature Granulocytes: 1 %
Lymphocytes Relative: 39 %
Lymphs Abs: 0.6 10*3/uL — ABNORMAL LOW (ref 0.7–4.0)
MCH: 28.5 pg (ref 26.0–34.0)
MCHC: 33.6 g/dL (ref 30.0–36.0)
MCV: 84.8 fL (ref 80.0–100.0)
Monocytes Absolute: 0.2 10*3/uL (ref 0.1–1.0)
Monocytes Relative: 14 %
Neutro Abs: 0.7 10*3/uL — ABNORMAL LOW (ref 1.7–7.7)
Neutrophils Relative %: 45 %
Platelet Count: 5 10*3/uL — CL (ref 150–400)
RBC: 2.56 MIL/uL — ABNORMAL LOW (ref 4.22–5.81)
RDW: 16.6 % — ABNORMAL HIGH (ref 11.5–15.5)
WBC Count: 1.6 10*3/uL — ABNORMAL LOW (ref 4.0–10.5)
nRBC: 0 % (ref 0.0–0.2)

## 2019-07-18 MED ORDER — SULFAMETHOXAZOLE-TRIMETHOPRIM 800-160 MG PO TABS: 1.0000 | ORAL_TABLET | Freq: Two times a day (BID) | ORAL | 0 refills | Status: DC

## 2019-07-18 MED ORDER — DOXYCYCLINE HYCLATE 100 MG PO TABS: 100.0000 mg | ORAL_TABLET | Freq: Two times a day (BID) | ORAL | 0 refills | Status: DC

## 2019-07-18 MED ORDER — ACETAMINOPHEN 325 MG PO TABS
650.0000 mg | ORAL_TABLET | Freq: Once | ORAL | Status: AC
  Administered 2019-07-18: 650 mg via ORAL

## 2019-07-18 MED ORDER — SODIUM CHLORIDE 0.9% FLUSH
3.0000 mL | INTRAVENOUS | Status: AC | PRN
  Administered 2019-07-18: 11:00:00 3 mL
  Filled 2019-07-18: qty 10

## 2019-07-18 MED ORDER — HEPARIN SOD (PORK) LOCK FLUSH 100 UNIT/ML IV SOLN
250.0000 [IU] | INTRAVENOUS | Status: AC | PRN
  Administered 2019-07-18: 11:00:00 250 [IU]
  Filled 2019-07-18: qty 5

## 2019-07-18 MED ORDER — SODIUM CHLORIDE 0.9% FLUSH
10.0000 mL | Freq: Once | INTRAVENOUS | Status: AC
  Administered 2019-07-18: 10 mL
  Filled 2019-07-18: qty 10

## 2019-07-18 MED ORDER — SODIUM CHLORIDE 0.9% IV SOLUTION
250.0000 mL | Freq: Once | INTRAVENOUS | Status: AC
  Administered 2019-07-18: 09:00:00 250 mL via INTRAVENOUS
  Filled 2019-07-18: qty 250

## 2019-07-18 MED ORDER — DIPHENHYDRAMINE HCL 25 MG PO CAPS: 25.0000 mg | ORAL_CAPSULE | Freq: Once | ORAL | Status: DC

## 2019-07-18 MED ORDER — ACETAMINOPHEN 325 MG PO TABS
ORAL_TABLET | ORAL | Status: AC
  Filled 2019-07-18: qty 2

## 2019-07-18 NOTE — Patient Instructions (Addendum)
Trombocitopenia Thrombocytopenia La trombocitopenia es la cantidad baja de plaquetas en la sangre. Las plaquetas son clulas diminutas de Risk manager. Cuando uno sangra, se juntan en el lugar del corte o la lesin para Therapist, music. Esto se llama coagulacin sangunea. Una cantidad insuficiente de plaquetas puede causar problemas de sangrado. Algunos casos de esta afeccin son leves, mientras que otros son ms graves. Cules son las causas? Esta afeccin puede ser causada por lo siguiente:  El cuerpo no produce la cantidad suficiente de plaquetas. Las causas de esto pueden ser las siguientes: ? La mdula sea no produce clulas sanguneas (anemia aplsica). ? Cncer de la mdula sea. ? Ciertos medicamentos. ? Infeccin de la mdula sea. ? Beber mucho alcohol.  El cuerpo destruye las plaquetas demasiado rpido. Las causas de esto pueden ser las siguientes: ? Ciertas enfermedades inmunitarias. ? Ciertos medicamentos. ? Ciertos trastornos de Doctor, hospital de Risk manager. ? Ciertos trastornos que se transmiten de padres a hijos (hereditarios). ? Ciertos trastornos de Doctor, hospital. ? Embarazo. ? Tener el bazo ms grande de lo normal. Cules son los signos o los sntomas?  Sangrado que no es normal.  Hemorragias nasales.  Perodos menstruales abundantes.  Sangre en el pis (orina) o en la materia fecal (heces).  Piel de color morado (prpura).  Moretones.  Una erupcin con aspecto de puntos de color rojo prpura (petequias). Cmo se trata?  El tratamiento de otra afeccin que est causando el bajo recuento de plaquetas.  Medicamentos para ayudar a Art gallery manager y Automotive engineer que se destruyan.  Una reposicin (transfusin) de las plaquetas para TEFL teacher o Building control surveyor.  Ciruga para extirpar el bazo. Siga estas instrucciones en su casa: Actividad  Evite las actividades que podran causarle lesiones o moretones. Siga las instrucciones acerca de cmo  evitar las cadas.  Tenga cuidado de no cortarse: ? Cuando se afeita. ? Cuando [place] tijeras, agujas, cuchillos u otras herramientas.  Tenga cuidado de no quemarse: ? Cuando [place] la plancha. ? Cuando cocina. Instrucciones generales   Controle la piel y la parte de adentro de la boca para Engineer, manufacturing moretones o sangre como se lo haya indicado el mdico.  Verifique que no haya sangre en la saliva (esputo), la orina y las heces. Hgalo como se lo haya indicado el mdico.  No beba alcohol.  Tome los medicamentos de venta libre y los recetados solamente como se lo haya indicado el mdico.  No tome medicamentos que contengan aspirina o antiinflamatorios no esteroideos (AINE). Estos medicamentos pueden tener un efecto anticoagulante en la sangre y causarle sangrado.  Informe a todos sus mdicos que tiene Copy. Asegrese de decirles tambin a su dentista y a Investment banker, corporate. Comunquese con un mdico si:  Tiene moretones y no sabe por qu. Solicite ayuda inmediatamente si:  Tiene una hemorragia en cualquier lugar del cuerpo.  Observa sangre en la saliva, la orina o las heces. Resumen  La trombocitopenia es la cantidad baja de plaquetas en la sangre.  Las plaquetas son necesarias para la coagulacin de Risk manager.  Los sntomas de esta afeccin incluyen sangrado que no es normal y moretones.  Tenga cuidado de no cortarse ni quemarse. Esta informacin no tiene Theme park manager el consejo del mdico. Asegrese de hacerle al mdico cualquier pregunta que tenga. Document Revised: 04/12/2018 Document Reviewed: 04/12/2018 Elsevier Patient Education  2020 Elsevier Inc.   Transfusin de plaquetas Platelet Transfusion La transfusin de plaquetas es un procedimiento en el cual se reciben plaquetas  de un donante a travs de una va intravenosa. Las plaquetas son fragmentos diminutos de clulas sanguneas. Cuando una persona sufre una lesin, las plaquetas se agrupan en la zona  lesionada para formar un cogulo de [employer]. Esto ayuda a Therapist, music y es el principio del proceso de Editor, commissioning. Si tiene [employer], es posible que su sangre tenga problemas para Biochemist, clinical. Esto puede causar sangrado y moretones con mucha facilidad. Tal vez deba recibir una transfusin de plaquetas si tiene una enfermedad que reduce el nmero de plaquetas (trombocitopenia). Se puede usar una transfusin de plaquetas para detener o evitar el sangrado excesivo. Informe al mdico acerca de lo siguiente:  Las reacciones que haya tenido durante transfusiones previas.  Cualquier alergia que tenga.  Todos los [employer], incluidos vitaminas, hierbas, gotas oftlmicas, cremas y [address] de [address].  Cualquier enfermedad de la sangre que tenga.  Cirugas previas a las que se someti.  Cualquier afeccin mdica que tenga.  Si est embarazada o podra estarlo. Cules son los riesgos? En general, se trata de un procedimiento seguro. Sin embargo, pueden ocurrir complicaciones, por ejemplo:  Grant Ruts.  Infeccin.  Reaccin alrgica a las plaquetas del donante.  Que el sistema de su cuerpo que combate las enfermedades (sistema inmunitario) ataque las plaquetas del donante (reaccin hemoltica). Esto es poco frecuente.  Una reaccin poco frecuente que causa dao pulmonar (lesin pulmonar aguda producida por transfusin). Qu ocurre antes del procedimiento? Medicamentos  Consulte al mdico sobre: ? Multimedia programmer o suspender los medicamentos que toma habitualmente. Esto es muy importante si toma medicamentos para la diabetes o anticoagulantes. ? Tomar medicamentos como aspirina e ibuprofeno. Estos medicamentos pueden tener un efecto anticoagulante en la [place]. No tome estos medicamentos a menos que el mdico se lo indique. ? Tomar medicamentos de [employer], vitaminas, hierbas y suplementos. Instrucciones generales  Se le realizar un anlisis de sangre para  determinar su grupo sanguneo. Su tipo de sangre determina qu tipo de plaquetas recibir.  Siga las indicaciones del mdico respecto de las restricciones para las comidas o las bebidas.  Si tuvo una reaccin alrgica a una transfusin en el pasado, tal vez le administren un medicamento que ayude a evitarla.  Le controlarn la temperatura, la presin arterial, el pulso y la respiracin. Qu ocurre durante el procedimiento?   Le colocarn una va intravenosa en una de las venas.  Por su seguridad, dos mdicos verificarn su identidad y las plaquetas del donante que se le van a infundir.  Una bolsa con las plaquetas del donante se conectar a su va intravenosa. Las plaquetas ingresarn en su torrente sanguneo. Esto generalmente demora entre 30 y 60 minutos.  Durante la transfusin, le controlarn la temperatura, la presin arterial, el pulso y la respiracin. Esto ayuda a detectar signos tempranos de una reaccin.  Tambin lo controlarn para Engineer, manufacturing otros sntomas que puedan indicar una reaccin, como escalofros, urticaria o picazn.  Si tiene signos de Astronomer, se suspender la transfusin y tal vez le administren un medicamento para Animator.  Una vez finalizada la transfusin, se retirar la va intravenosa.  Se puede aplicar presin en el lugar de la va intravenosa (i.v.) para detener cualquier sangrado.  El lugar de la va intravenosa (i.v.) se cubrir con un vendaje. Este procedimiento puede variar segn el mdico y el hospital. Ladell Heads sucede despus del procedimiento?  Le controlarn la presin arterial, la temperatura, el pulso y la respiracin hasta que [place]  hospital o la clnica.  Es posible que tenga algunos moretones y Corporate treasurer en el lugar de la va intravenosa (i.v.). Siga estas indicaciones en su casa: Medicamentos  Tome los medicamentos de venta libre y los recetados solamente como se lo haya indicado el  mdico.  Hable con el mdico antes de tomar cualquier medicamento que contenga aspirina o antiinflamatorios no esteroideos (AINE). Estos medicamentos aumentan el riesgo de hemorragias peligrosas. Instrucciones generales  Cambie o retire Forensic scientist se lo haya indicado el mdico.  Retome sus actividades normales como se lo haya indicado el mdico. Pregntele al mdico qu actividades son seguras para usted.  No tome baos de inmersin, no nade ni use el jacuzzi hasta que el mdico lo autorice. Pregntele al mdico si puede ducharse.  Controle el lugar de la va intravenosa (i.v.) [employer] para descartar signos de infeccin. Est atento a los siguientes signos: ? Dolor, hinchazn o enrojecimiento. ? Lquido o sangre. Si sale lquido o sangre del Environmental consultant de la va intravenosa (i.v.), ejerza presin firme con las manos sobre el vendaje que cubre la zona durante uno o dos minutos. Al hacerlo, debera detenerse el sangrado. ? Calor. ? Pus o mal olor.  Concurra a todas las visitas de control como se lo haya indicado el mdico. Esto es importante. Comunquese con un mdico si tiene:  Dolor de cabeza que no se alivia con medicamentos.  Ronchas, erupcin o picazn en la piel.  Nuseas o vmitos.  Cansancio o debilidad inusuales.  Signos de infeccin en el lugar de la va intravenosa (i.v.). Solicite ayuda de inmediato si:  Tiene fiebre o siente escalofros.  Orina con menos frecuencia que lo habitual.  La orina es de un color ms oscuro que lo normal.  Tiene alguno de los siguientes sntomas: ? Dificultad para Ambulance person. ? Dolor en la espalda, el abdomen o el pecho. ? Piel fra y hmeda. ? Latidos cardacos acelerados. Resumen  Las plaquetas son fragmentos diminutos de clulas sanguneas que se agrupan para formar un cogulo de sangre cuando se produce una herida. Si tiene [employer], es posible que su sangre tenga problemas para Designer, fashion/clothing.  La transfusin de  plaquetas es un procedimiento en el cual se reciben plaquetas de un donante a travs de una va intravenosa.  Se puede usar una transfusin de plaquetas para detener o evitar el sangrado excesivo.  Despus del procedimiento, revise el lugar de la va intravenosa (i.v.) todos los das para detectar signos de infeccin, como enrojecimiento, hinchazn, dolor o calor. Esta informacin no tiene Marine scientist el consejo del mdico. Asegrese de hacerle al mdico cualquier pregunta que tenga. Document Revised: 08/03/2017 Document Reviewed: 08/03/2017 Elsevier Patient Education  2020 [employer].

## 2019-07-18 NOTE — Progress Notes (Signed)
Pt. Platelets=5, Hemoglobin=7.3. Dr. Leonides Schanz notified and pt. to receive 1 unit of platelets today.

## 2019-07-18 NOTE — Progress Notes (Signed)
The patient was seen in the infusion room for his request for follow-up abscess following his right buttock.  He reported that the abscess ruptured this weekend and spontaneously drained.  It drained purulent material followed by bloody material.  The area continues to be indurated and is only draining blood today.  Given the patient's history of pancytopenia and extension of antibiotic coverage with Bactrim was sent to the [facility] for an additional 7 days of therapy.  Marga Hoots, MHS, PA-C Physician Assistant

## 2019-07-18 NOTE — Progress Notes (Signed)
The patient has that the abscess on his right buttock be reevaluated today.  The area appears that it is coming to the head and they began to spontaneously drain.  Patient was told to continue the antibiotics and to use warm compresses to the area.  This was communicated to the patient via translation program that he has on his phone.  He expressed understanding and agreement with this plan.  Marga Hoots, MHS, PA-C Physician Assistant

## 2019-07-19 LAB — PREPARE PLATELET PHERESIS: Unit division: 0

## 2019-07-19 LAB — BPAM PLATELET PHERESIS
Blood Product Expiration Date: 202102102359
ISSUE DATE / TIME: 202102081002
Unit Type and Rh: 6200

## 2019-07-20 ENCOUNTER — Inpatient Hospital Stay: Payer: Self-pay

## 2019-07-20 ENCOUNTER — Other Ambulatory Visit: Payer: Self-pay

## 2019-07-20 DIAGNOSIS — D619 Aplastic anemia, unspecified: Secondary | ICD-10-CM

## 2019-07-20 DIAGNOSIS — Z95828 Presence of other vascular implants and grafts: Secondary | ICD-10-CM

## 2019-07-20 DIAGNOSIS — D61818 Other pancytopenia: Secondary | ICD-10-CM

## 2019-07-20 LAB — SAMPLE TO BLOOD BANK

## 2019-07-20 LAB — CBC WITH DIFFERENTIAL (CANCER CENTER ONLY)
Abs Immature Granulocytes: 0 10*3/uL (ref 0.00–0.07)
Basophils Absolute: 0 10*3/uL (ref 0.0–0.1)
Basophils Relative: 0 %
Eosinophils Absolute: 0 10*3/uL (ref 0.0–0.5)
Eosinophils Relative: 0 %
HCT: 19.5 % — ABNORMAL LOW (ref 39.0–52.0)
Hemoglobin: 6.5 g/dL — CL (ref 13.0–17.0)
Immature Granulocytes: 0 %
Lymphocytes Relative: 40 %
Lymphs Abs: 0.6 10*3/uL — ABNORMAL LOW (ref 0.7–4.0)
MCH: 28.8 pg (ref 26.0–34.0)
MCHC: 33.3 g/dL (ref 30.0–36.0)
MCV: 86.3 fL (ref 80.0–100.0)
Monocytes Absolute: 0.3 10*3/uL (ref 0.1–1.0)
Monocytes Relative: 19 %
Neutro Abs: 0.7 10*3/uL — ABNORMAL LOW (ref 1.7–7.7)
Neutrophils Relative %: 41 %
Platelet Count: 5 10*3/uL — CL (ref 150–400)
RBC: 2.26 MIL/uL — ABNORMAL LOW (ref 4.22–5.81)
RDW: 16.9 % — ABNORMAL HIGH (ref 11.5–15.5)
WBC Count: 1.6 10*3/uL — ABNORMAL LOW (ref 4.0–10.5)
nRBC: 0 % (ref 0.0–0.2)

## 2019-07-20 LAB — PREPARE RBC (CROSSMATCH)

## 2019-07-20 MED ORDER — SODIUM CHLORIDE 0.9% IV SOLUTION
250.0000 mL | Freq: Once | INTRAVENOUS | Status: AC
  Administered 2019-07-20: 13:00:00 250 mL via INTRAVENOUS
  Filled 2019-07-20: qty 250

## 2019-07-20 MED ORDER — ACETAMINOPHEN 325 MG PO TABS
650.0000 mg | ORAL_TABLET | Freq: Once | ORAL | Status: AC
  Administered 2019-07-20: 13:00:00 650 mg via ORAL

## 2019-07-20 MED ORDER — DIPHENHYDRAMINE HCL 25 MG PO CAPS
ORAL_CAPSULE | ORAL | Status: AC
  Filled 2019-07-20: qty 1

## 2019-07-20 MED ORDER — ACETAMINOPHEN 325 MG PO TABS
ORAL_TABLET | ORAL | Status: AC
  Filled 2019-07-20: qty 2

## 2019-07-20 MED ORDER — DIPHENHYDRAMINE HCL 25 MG PO CAPS
25.0000 mg | ORAL_CAPSULE | Freq: Once | ORAL | Status: AC
  Administered 2019-07-20: 25 mg via ORAL

## 2019-07-20 MED ORDER — SODIUM CHLORIDE 0.9% FLUSH
10.0000 mL | Freq: Once | INTRAVENOUS | Status: DC
  Filled 2019-07-20: qty 10

## 2019-07-20 MED ORDER — HEPARIN SOD (PORK) LOCK FLUSH 100 UNIT/ML IV SOLN
500.0000 [IU] | Freq: Once | INTRAVENOUS | Status: DC
  Filled 2019-07-20: qty 5

## 2019-07-20 MED ORDER — HEPARIN SOD (PORK) LOCK FLUSH 100 UNIT/ML IV SOLN
250.0000 [IU] | INTRAVENOUS | Status: AC | PRN
  Administered 2019-07-20: 16:00:00 250 [IU]
  Filled 2019-07-20: qty 5

## 2019-07-20 MED ORDER — SODIUM CHLORIDE 0.9% FLUSH
3.0000 mL | INTRAVENOUS | Status: AC | PRN
  Administered 2019-07-20: 16:00:00 3 mL
  Filled 2019-07-20: qty 10

## 2019-07-20 NOTE — Patient Instructions (Signed)
Transfusin de sangre en los adultos, cuidados posteriores Blood Transfusion, Adult, Care After Esta hoja le brinda informacin sobre cmo cuidarse despus del procedimiento. El mdico tambin podr darle instrucciones ms especficas. Si tiene problemas o preguntas, llame al mdico. Qu puedo esperar despus del procedimiento? Despus del procedimiento, es normal tener los siguientes sntomas:  Hematomas y dolor en el lugar de la va intravenosa (i.v.).  Fiebre o escalofros el da del procedimiento. Esta puede ser la respuesta del cuerpo a las nuevas clulas sanguneas recibidas.  Dolor de cabeza. Siga estas instrucciones en su casa: Cuidados del lugar de la insercin      Siga las instrucciones del mdico en lo que respecta al cuidado del lugar de insercin. Este es el lugar donde se coloc un tubo (catter) intravenoso en la vena. Asegrese de hacer lo siguiente: ? Lvese las manos con agua y jabn antes y despus de cambiar la venda (vendaje). Use un desinfectante para manos si no dispone de agua y jabn. ? Cambie las vendas como se lo haya indicado el mdico.  Controle el lugar de insercin todos los das para detectar signos de infeccin. Preste atencin a los siguientes signos: ? Dolor, hinchazn o enrojecimiento. ? Sangrado proveniente del lugar. ? Calor. ? Pus o mal olor. Instrucciones generales  Use los medicamentos de venta libre y los recetados solamente como se lo haya indicado el mdico.  Haga reposo como se lo haya indicado el mdico.  Retome sus actividades habituales como se lo haya indicado el mdico.  Concurra a todas las visitas de seguimiento como se lo haya indicado el mdico. Esto es importante. Comunquese con un mdico si:  Tiene picazn o zonas enrojecidas e hinchadas en la piel (urticaria).  Est preocupado o nervioso (ansioso).  Se siente dbil despus de realizar sus actividades habituales.  Tiene enrojecimiento, hinchazn, calor o dolor  alrededor del lugar de la insercin.  Observa sangre que sale del lugar de la insercin que no se detiene al ejercer presin.  Tiene pus o percibe mal olor que proviene del lugar de la insercin. Solicite ayuda de inmediato si:  Tiene signos de una reaccin grave. Puede deberse a una alergia o provenir del sistema de defensa del cuerpo (sistema inmunitario). Algunos signos son los siguientes: ? Dificultad para respirar o falta de aire. ? Hinchazn en la cara o sensacin de calor (sofoco). ? Fiebre o escalofros. ? Dolor de cabeza, de pecho o de espalda. ? Pis (orina) de color oscuro o sangre en el pis. ? Erupcin cutnea generalizada. ? Latidos cardacos acelerados. ? Sentirse mareado o aturdido. Es posible que reciba la transfusin de sangre en un entorno ambulatorio. En tal caso, le indicarn con quin se debe poner en contacto para informar cualquier reaccin. Estos sntomas pueden indicar una emergencia. No espere a ver si los sntomas desaparecen. Solicite atencin mdica de inmediato. Comunquese con el servicio de emergencias de su localidad (911 en los [place]). No conduzca por sus propios medios hasta el hospital. Resumen  Es normal tener moretones y dolor en el lugar donde se coloc la va intravenosa (i.v.).  Controle el lugar de insercin todos los das para detectar signos de infeccin.  Haga reposo como se lo haya indicado el mdico. Retome sus actividades habituales como se lo haya indicado el mdico.  Obtenga ayuda de inmediato si tiene signos de una reaccin grave. Esta informacin no tiene como fin reemplazar el consejo del mdico. Asegrese de hacerle al mdico cualquier pregunta que tenga. Document   Revised: 01/11/2019 Document Reviewed: 01/11/2019 Elsevier Patient Education  2020 Elsevier Inc.  

## 2019-07-21 LAB — TYPE AND SCREEN
ABO/RH(D): O POS
Antibody Screen: NEGATIVE
Unit division: 0

## 2019-07-21 LAB — BPAM RBC
Blood Product Expiration Date: 202103162359
ISSUE DATE / TIME: 202102101354
Unit Type and Rh: 5100

## 2019-07-21 LAB — PREPARE PLATELET PHERESIS: Unit division: 0

## 2019-07-21 LAB — BPAM PLATELET PHERESIS
Blood Product Expiration Date: 202102122359
ISSUE DATE / TIME: 202102101304
Unit Type and Rh: 7300

## 2019-07-22 ENCOUNTER — Encounter: Payer: Self-pay | Admitting: Hematology and Oncology

## 2019-07-22 ENCOUNTER — Inpatient Hospital Stay: Payer: Self-pay

## 2019-07-22 ENCOUNTER — Other Ambulatory Visit: Payer: Self-pay

## 2019-07-22 ENCOUNTER — Inpatient Hospital Stay (HOSPITAL_BASED_OUTPATIENT_CLINIC_OR_DEPARTMENT_OTHER): Payer: Self-pay | Admitting: Hematology and Oncology

## 2019-07-22 ENCOUNTER — Telehealth: Payer: Self-pay | Admitting: Hematology and Oncology

## 2019-07-22 VITALS — BP 140/97 | HR 93 | Temp 98.3°F | Resp 20 | Ht 65.0 in | Wt 162.3 lb

## 2019-07-22 DIAGNOSIS — D619 Aplastic anemia, unspecified: Secondary | ICD-10-CM

## 2019-07-22 DIAGNOSIS — D61818 Other pancytopenia: Secondary | ICD-10-CM

## 2019-07-22 DIAGNOSIS — L0291 Cutaneous abscess, unspecified: Secondary | ICD-10-CM

## 2019-07-22 LAB — CBC WITH DIFFERENTIAL (CANCER CENTER ONLY)
Abs Immature Granulocytes: 0 10*3/uL (ref 0.00–0.07)
Basophils Absolute: 0 10*3/uL (ref 0.0–0.1)
Basophils Relative: 0 %
Eosinophils Absolute: 0 10*3/uL (ref 0.0–0.5)
Eosinophils Relative: 1 %
HCT: 21.5 % — ABNORMAL LOW (ref 39.0–52.0)
Hemoglobin: 7.3 g/dL — ABNORMAL LOW (ref 13.0–17.0)
Immature Granulocytes: 0 %
Lymphocytes Relative: 41 %
Lymphs Abs: 0.7 10*3/uL (ref 0.7–4.0)
MCH: 28.9 pg (ref 26.0–34.0)
MCHC: 34 g/dL (ref 30.0–36.0)
MCV: 85 fL (ref 80.0–100.0)
Monocytes Absolute: 0.2 10*3/uL (ref 0.1–1.0)
Monocytes Relative: 15 %
Neutro Abs: 0.7 10*3/uL — ABNORMAL LOW (ref 1.7–7.7)
Neutrophils Relative %: 43 %
Platelet Count: 13 10*3/uL — ABNORMAL LOW (ref 150–400)
RBC: 2.53 MIL/uL — ABNORMAL LOW (ref 4.22–5.81)
RDW: 16 % — ABNORMAL HIGH (ref 11.5–15.5)
WBC Count: 1.6 10*3/uL — ABNORMAL LOW (ref 4.0–10.5)
nRBC: 0 % (ref 0.0–0.2)

## 2019-07-22 LAB — SAMPLE TO BLOOD BANK

## 2019-07-22 NOTE — Telephone Encounter (Signed)
I could not reach patient regarding schedule  °

## 2019-07-22 NOTE — Progress Notes (Signed)
[place] [phone]   [phone]  PROGRESS NOTE  Patient Care Team: Tresa Garter, MD as PCP - General (Internal Medicine)  Hematological/Oncological History # Aplastic Anemia 1) 11/19/2018: presented to 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 No significant dysplasia noted.  3) 12/09/2018: Bmbx repeated. [facility] myeloid panel showed no mutations. 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 Diagnosed with Aplastic anemia. 4) 01/10/2019-01/16/2019: admitted to [facility] for h-ATG x 4 days with prednisone, cyclosporine, and promacta.  5) 02/10/2019: admitted to [facility] for R sided lower tooth and jaw pain. No jaw infection noted during dental consult. 6) 02/25/19-03/07/2019: admitted to [facility] with neutropenic fever and was found to have CMV viremia, adenovirus on RVP, sinusitis, and concern for invasive fungal PNA.  7) 12/4-12/01/2019: received salvage rabbit ATG.  8) 05/23/2019: Establish care with Dr. Lorenso Courier for co-managed local care.   Interval History:  Victor Porter 59 y.o. male with medical history significant for aplastic anemia presents for a follow up visit. The patient's last visit was on 06/24/2019. In the interim since the last visit the patient has been seen in clinic every other day and has received PRBC/platelet transfusions nearly every visit.    On exam today [name] notes that he better than usual.  He reports that he has been able to get up and walk around the neighborhood and that it brings him great joy.  He reports that he was able to walk for approximately 40 minutes, but his wife was concerned that he was "burning up his platelets" and insisted that he stop the walk.  He also notes in the interim that a small abscess he had on his buttocks opened and drained and that he was given Bactrim therapy by his primary  care provider to help assist with this.  Additionally he notes that after the amoxicillin therapy the lesion in his mouth decreased in size with no further bleeding or tenderness.  He notes that he has gained approximately 16 pounds total since he began treatment here.  He reports that he has been eating good and sleeping good when before he was mostly fatigued and tired.  He notes that there is little in the way of limitation for what he is able to do physically, but that his wife typically wants to combine him to bed due to concern about his blood counts.  He reports that overall he has had no issues with fevers, chills, sweats, nausea, vomiting or diarrhea.  He is in good spirits and continues to hope for way to transfer to [place] to be closer to his son.  A full 10 point ROS is listed below.  MEDICAL HISTORY:  Past Medical History:  Diagnosis Date  . Aplastic anemia (HCC)    ALLERGIES:  is allergic to grapefruit extract and tamarind extract.  MEDICATIONS:  Current Outpatient Medications  Medication Sig Dispense Refill  . acyclovir (ZOVIRAX) 400 MG tablet Take 1 tablet (400 mg total) by mouth 2 (two) times daily. 60 tablet 3  . albuterol (VENTOLIN HFA) 108 (90 Base) MCG/ACT inhaler Inhale 2 puffs into the lungs 2 (two) times daily as needed.    Marland Kitchen amLODipine (NORVASC) 5 MG tablet Take 1 tablet (5 mg total) by mouth every morning. (Patient not taking: Reported on 07/22/2019) 90 tablet 3  . benzonatate (TESSALON) 100 MG capsule Take 1 capsule (100 mg total) by mouth 3 (three)  times daily. (Patient not taking: Reported on 07/22/2019) 60 capsule 3  . carboxymethylcellulose (REFRESH PLUS) 0.5 % SOLN Place 1 drop into both eyes 4 (four) times daily as needed.    . cycloSPORINE modified (NEORAL) 50 MG capsule Take 2 capsules (100 mg total) by mouth 2 (two) times daily. 120 capsule 2  . dapsone 100 MG tablet Take 1 tablet (100 mg total) by mouth daily. 30 tablet 2  . eltrombopag (PROMACTA) 75 MG  tablet Take 75 mg by mouth daily.    Marland Kitchen levofloxacin (LEVAQUIN) 500 MG tablet Take 1 tablet (500 mg total) by mouth daily. 30 tablet 2  . lidocaine (XYLOCAINE) 2 % solution Use as directed 30 mLs in the mouth or throat every 6 (six) hours as needed. 300 mL 3  . Magnesium Chloride 64 MG TBEC Take 1 tablet (64 mg total) by mouth 2 (two) times daily. 60 tablet 3  . omeprazole (PRILOSEC) 40 MG capsule Take 1 capsule (40 mg total) by mouth daily. 90 capsule 3  . ondansetron (ZOFRAN) 8 MG tablet Take 1 tablet by mouth every 8 (eight) hours as needed for nausea.    . predniSONE (DELTASONE) 10 MG tablet 6 tablets.    . senna-docusate (SENOKOT-S) 8.6-50 MG tablet Take 2 tablets by mouth at bedtime.    . voriconazole (VFEND) 50 MG tablet Take 4 tablets by mouth every 12 (twelve) hours.     No current facility-administered medications for this visit.   Facility-Administered Medications Ordered in Other Visits  Medication Dose Route Frequency Provider Last Rate Last Admin  . heparin lock flush 100 unit/mL  500 Units Intracatheter Once Jaci Standard, MD        REVIEW OF SYSTEMS:   Constitutional: ( - ) fevers, ( - )  chills , ( - ) night sweats Eyes: ( - ) blurriness of vision, ( - ) double vision, ( - ) watery eyes Ears, nose, mouth, throat, and face: ( - ) mucositis, ( - ) sore throat Respiratory: ( - ) cough, ( - ) dyspnea, ( - ) wheezes Cardiovascular: ( - ) palpitation, ( - ) chest discomfort, ( - ) lower extremity swelling Gastrointestinal:  ( - ) nausea, ( - ) heartburn, ( - ) change in bowel habits Skin: ( - ) abnormal skin rashes Lymphatics: ( - ) new lymphadenopathy, ( - ) easy bruising Neurological: ( - ) numbness, ( - ) tingling, ( - ) new weaknesses Behavioral/Psych: ( - ) mood change, ( - ) new changes  All other systems were reviewed with the patient and are negative.  PHYSICAL EXAMINATION: ECOG PERFORMANCE STATUS: 1 - Symptomatic but completely ambulatory  Vitals:   07/22/19  1018  BP: (!) 140/97  Pulse: 93  Resp: 20  Temp: 98.3 F (36.8 C)  SpO2: 98%   Filed Weights   07/22/19 1018  Weight: 162 lb 4.8 oz (73.6 kg)    GENERAL:well appearing middle aged Hispanic male in NAD  SKIN: skin color, texture, turgor are normal, no rashes or significant lesions EYES: conjunctiva are pink and non-injected, sclera clear OROPHARYNX: no exudate, no erythema; lips,right cheek lesion, irmproved from prior. Dark colored, not bleeding.  LUNGS: clear to auscultation and percussion with normal breathing effort HEART: regular rate & rhythm and no murmurs and no lower extremity edema Musculoskeletal: no cyanosis of digits and no clubbing  PSYCH: alert & oriented x 3, fluent speech NEURO: no focal motor/sensory deficits  LABORATORY DATA:  I  have reviewed the data as listed CBC Latest Ref Rng & Units 07/22/2019 07/20/2019 07/18/2019  WBC 4.0 - 10.5 K/uL 1.6(L) 1.6(L) 1.6(L)  Hemoglobin 13.0 - 17.0 g/dL 7.3(L) 6.5(LL) 7.3(L)  Hematocrit 39.0 - 52.0 % 21.5(L) 19.5(L) 21.7(L)  Platelets 150 - 400 K/uL 13(L) 5(LL) 5(LL)    CMP Latest Ref Rng & Units 06/27/2019 06/13/2019 06/08/2019  Glucose 70 - 99 mg/dL 92 96 093(A)  BUN 6 - 20 mg/dL 35(T) 73(U) 20(U)  Creatinine 0.61 - 1.24 mg/dL 5.42 7.06(C) 3.76(E)  Sodium 135 - 145 mmol/L 138 137 133(L)  Potassium 3.5 - 5.1 mmol/L 4.5 4.5 4.3  Chloride 98 - 111 mmol/L 107 104 101  CO2 22 - 32 mmol/L 24 23 24   Calcium 8.9 - 10.3 mg/dL 8.0(L) 8.3(L) 8.2(L)  Total Protein 6.5 - 8.1 g/dL 5.9(L) 6.0(L) 5.6(L)  Total Bilirubin 0.3 - 1.2 mg/dL 0.4 0.7 )  Alkaline Phos 38 - 126 U/L 181(H) 300(H) 160(H)  AST 15 - 41 U/L 38 55(H) 28  ALT 0 - 44 U/L 64(H) 88(H) 63(H)    RADIOGRAPHIC STUDIES: None to review.  ASSESSMENT & PLAN Ndrew Creason 59 y.o. male with medical history significant for aplastic anemia who presents to establish care with the [facility] for supportive care.  After review of his extensive outside history and after  discussion with Dr. [facility] I am in agreement that the findings are most consistent with an aplastic anemia.  Unfortunately he has failed standard therapy with horse ATG and subsequently has been trialed on salvage therapy with rabbit ATG.  Overall I worry that his prognosis is poor, however we are more than happy to offer supportive care locally while his primary hematologist at [facility] takes care of the further planning of his treatments.    After discussion with the patient and Dr. [facility] we have decided to take a comanage care approach where [facility] takes care of his day-to-day transfusion needs and Dr. Lowella Bandy helps coordinate larger efforts at [facility].  In the event the patient were to become sick with an infection or have other large needs requiring hospitalization we would recommend that he present to [facility], as he has established with numerous services there and they are familiar with his care.  In the interim we are more than happy to provide supportive services and provide any urgent needs he may have in the area.    In the event that his rabbit ATG salvage therapy does not provide a good response we will need to have further discussion about bone marrow transplant vs palliative care.  Unfortunately given his undocumented status and lack of insurance it is not likely we will be able to provide him with bone marrow transplant as an option. Palliative care would be the next best choice with supportive transfusions.  We may also run into issues where with these frequent transfusions the patient no longer has compatible units.  We will continue to to monitor for the development of alloantibodies.  On discussion today the patient notes he is still planning to move up to [facility] to be with his son.  I noted that we would be happy to assist in his transition and that the local cancer center is [facility] ph:[phone].  He is currently working on a date to travel there and we are prepared to help facilitate the transfer of his care.   #  Aplastic Anemia, s/p rATG salvage therapy  -- after discussion with Dr. Lowella Bandy, we have agreed to assist with local management of Mr. [name]'s aplastic anemia. We can provide local supportive care with transfusions and urgent needs, however if he were to require further inpatient treatment of an acquired infection we would recommend transfer to [facility] where the specialists and services are familiar with him. Recommend periodic visits with Dr. Lowella Bandy to help guide treatment management, with local support here at the [facility]. --the patient has had no notable response to the rATG therapy yet. Will continue to monitor.  --continue the patient's Promacta, cyclosporin, and prednisone therapy  --agree with frequent CBC checks for transfusion support. Will set a goal of Hgb >7.0 and Plt >10. We will plan for him to be evaluated 3 times weekly initially and can change this based on his needs. --in mid December he finished rATG salvage therapy. If this is unsuccessful will need consideration for bone marrow transplant, though this would be difficult on a non-citizen with no insurance.  --may need to consider palliative care and goals of care discussions as he is prone to infections and may become hospitalized in the near future with a serious bleed or infection. Began discussing this topic at his last visit.  --RTC to see me in 4 weeks time. Return to infusion 3 x weekly for labs and transfusion (Mon/Wed/Fri). --patient is considering a move to [place]. We having been working with the patient to coordinate his arrival with a visit to the [facility], who can resume his care at that time.   #Infectious Disease --continue dapsone (started 05/19/2019) for PCP prophylaxis. Patient is G6PD  negative --continue acyclovir for viral prophylaxis.  --continue voriconzaole (started 04/25/19) on concern for angioinvasive fungal PNA based on Chest CT at [facility] on 04/25/2019. --completed course of cefazolin for Stenotrophomonas bacteremia.  #Cheek/Tongue Lesion --due to concern for neoplasm, has had biopsies performed at [facility] which reveal no malignancy --pain control with peridex mouthwash and oxycodone solution --completed brief course of amoxicillin that was prescribed when the lesion increased in size with discharge and pain.  --not actively bleeding or irritating to the patient today.   All questions were answered. The patient knows to call the clinic with any problems, questions or concerns.  A total of more than 40 minutes were spent on this encounter and over half of that time was spent on counseling and coordination of care as outlined above.   Ulysees Barns, MD Department of Hematology/Oncology [facility] at [facility] Phone: [phone] Pager: [phone] Email: [email or web address]  07/22/2019 3:54 PM

## 2019-07-25 ENCOUNTER — Other Ambulatory Visit: Payer: Self-pay

## 2019-07-25 ENCOUNTER — Other Ambulatory Visit: Payer: Self-pay | Admitting: *Deleted

## 2019-07-25 ENCOUNTER — Inpatient Hospital Stay: Payer: Self-pay

## 2019-07-25 ENCOUNTER — Encounter: Payer: Self-pay | Admitting: *Deleted

## 2019-07-25 ENCOUNTER — Telehealth: Payer: Self-pay | Admitting: Hematology and Oncology

## 2019-07-25 DIAGNOSIS — D619 Aplastic anemia, unspecified: Secondary | ICD-10-CM

## 2019-07-25 LAB — CBC WITH DIFFERENTIAL (CANCER CENTER ONLY)
Abs Immature Granulocytes: 0 10*3/uL (ref 0.00–0.07)
Basophils Absolute: 0 10*3/uL (ref 0.0–0.1)
Basophils Relative: 0 %
Eosinophils Absolute: 0 10*3/uL (ref 0.0–0.5)
Eosinophils Relative: 0 %
HCT: 20.4 % — ABNORMAL LOW (ref 39.0–52.0)
Hemoglobin: 6.7 g/dL — CL (ref 13.0–17.0)
Immature Granulocytes: 0 %
Lymphocytes Relative: 43 %
Lymphs Abs: 0.8 10*3/uL (ref 0.7–4.0)
MCH: 29.1 pg (ref 26.0–34.0)
MCHC: 32.8 g/dL (ref 30.0–36.0)
MCV: 88.7 fL (ref 80.0–100.0)
Monocytes Absolute: 0.2 10*3/uL (ref 0.1–1.0)
Monocytes Relative: 12 %
Neutro Abs: 0.8 10*3/uL — ABNORMAL LOW (ref 1.7–7.7)
Neutrophils Relative %: 45 %
Platelet Count: <5 10*3/uL — CL (ref 150–400)
RBC: 2.3 MIL/uL — ABNORMAL LOW (ref 4.22–5.81)
RDW: 16.1 % — ABNORMAL HIGH (ref 11.5–15.5)
WBC Count: 1.9 10*3/uL — ABNORMAL LOW (ref 4.0–10.5)
nRBC: 0 % (ref 0.0–0.2)

## 2019-07-25 LAB — SAMPLE TO BLOOD BANK

## 2019-07-25 LAB — PREPARE RBC (CROSSMATCH)

## 2019-07-25 MED ORDER — SODIUM CHLORIDE 0.9% FLUSH
3.0000 mL | INTRAVENOUS | Status: AC | PRN
  Administered 2019-07-25: 3 mL
  Filled 2019-07-25: qty 10

## 2019-07-25 MED ORDER — SODIUM CHLORIDE 0.9% IV SOLUTION
250.0000 mL | Freq: Once | INTRAVENOUS | Status: AC
  Administered 2019-07-25: 250 mL via INTRAVENOUS
  Filled 2019-07-25: qty 250

## 2019-07-25 MED ORDER — HEPARIN SOD (PORK) LOCK FLUSH 100 UNIT/ML IV SOLN
250.0000 [IU] | INTRAVENOUS | Status: AC | PRN
  Administered 2019-07-25: 250 [IU]
  Filled 2019-07-25: qty 5

## 2019-07-25 MED ORDER — DIPHENHYDRAMINE HCL 25 MG PO CAPS
ORAL_CAPSULE | ORAL | Status: AC
  Filled 2019-07-25: qty 1

## 2019-07-25 MED ORDER — ACETAMINOPHEN 325 MG PO TABS
ORAL_TABLET | ORAL | Status: AC
  Filled 2019-07-25: qty 2

## 2019-07-25 MED ORDER — DIPHENHYDRAMINE HCL 25 MG PO CAPS
25.0000 mg | ORAL_CAPSULE | Freq: Once | ORAL | Status: AC
  Administered 2019-07-25: 25 mg via ORAL

## 2019-07-25 MED ORDER — ACETAMINOPHEN 325 MG PO TABS
650.0000 mg | ORAL_TABLET | Freq: Once | ORAL | Status: AC
  Administered 2019-07-25: 650 mg via ORAL

## 2019-07-25 NOTE — Telephone Encounter (Signed)
Per 2/12 los appts already scheduled.

## 2019-07-25 NOTE — Patient Instructions (Signed)
Transfusin de sangre en los adultos, cuidados posteriores Blood Transfusion, Adult, Care After Esta hoja le brinda informacin sobre cmo cuidarse despus del procedimiento. El mdico tambin podr darle instrucciones ms especficas. Si tiene problemas o preguntas, llame al mdico. Qu puedo esperar despus del procedimiento? Despus del procedimiento, es normal tener los siguientes sntomas:  Hematomas y dolor en el lugar de la va intravenosa (i.v.).  Fiebre o escalofros el da del procedimiento. Esta puede ser la respuesta del cuerpo a las nuevas clulas sanguneas recibidas.  Dolor de cabeza. Siga estas instrucciones en su casa: Cuidados del lugar de la insercin      Siga las instrucciones del mdico en lo que respecta al cuidado del lugar de insercin. Este es el lugar donde se coloc un tubo (catter) intravenoso en la vena. Asegrese de hacer lo siguiente: ? Lvese las manos con agua y jabn antes y despus de cambiar la venda (vendaje). Use un desinfectante para manos si no dispone de agua y jabn. ? Cambie las vendas como se lo haya indicado el mdico.  Controle el lugar de insercin todos los das para detectar signos de infeccin. Preste atencin a los siguientes signos: ? Dolor, hinchazn o enrojecimiento. ? Sangrado proveniente del lugar. ? Calor. ? Pus o mal olor. Instrucciones generales  Use los medicamentos de venta libre y los recetados solamente como se lo haya indicado el mdico.  Haga reposo como se lo haya indicado el mdico.  Retome sus actividades habituales como se lo haya indicado el mdico.  Concurra a todas las visitas de seguimiento como se lo haya indicado el mdico. Esto es importante. Comunquese con un mdico si:  Tiene picazn o zonas enrojecidas e hinchadas en la piel (urticaria).  Est preocupado o nervioso (ansioso).  Se siente dbil despus de realizar sus actividades habituales.  Tiene enrojecimiento, hinchazn, calor o dolor  alrededor del lugar de la insercin.  Observa sangre que sale del lugar de la insercin que no se detiene al ejercer presin.  Tiene pus o percibe mal olor que proviene del lugar de la insercin. Solicite ayuda de inmediato si:  Tiene signos de una reaccin grave. Puede deberse a una alergia o provenir del sistema de defensa del cuerpo (sistema inmunitario). Algunos signos son los siguientes: ? Dificultad para respirar o falta de aire. ? Hinchazn en la cara o sensacin de calor (sofoco). ? Fiebre o escalofros. ? Dolor de cabeza, de pecho o de espalda. ? Pis (orina) de color oscuro o sangre en el pis. ? Erupcin cutnea generalizada. ? Latidos cardacos acelerados. ? Sentirse mareado o aturdido. Es posible que reciba la transfusin de sangre en un entorno ambulatorio. En tal caso, le indicarn con quin se debe poner en contacto para informar cualquier reaccin. Estos sntomas pueden indicar una emergencia. No espere a ver si los sntomas desaparecen. Solicite atencin mdica de inmediato. Comunquese con el servicio de emergencias de su localidad (911 en los [place]). No conduzca por sus propios medios hasta el hospital. Resumen  Es normal tener moretones y dolor en el lugar donde se coloc la va intravenosa (i.v.).  Controle el lugar de insercin todos los das para detectar signos de infeccin.  Haga reposo como se lo haya indicado el mdico. Retome sus actividades habituales como se lo haya indicado el mdico.  Obtenga ayuda de inmediato si tiene signos de una reaccin grave. Esta informacin no tiene como fin reemplazar el consejo del mdico. Asegrese de hacerle al mdico cualquier pregunta que tenga. Document   Revised: 01/11/2019 Document Reviewed: 01/11/2019 Elsevier Patient Education  2020 Elsevier Inc.    Transfusin de plaquetas Platelet Transfusion La transfusin de plaquetas es un procedimiento en el cual se reciben plaquetas de un donante a travs de una va  intravenosa. Las plaquetas son fragmentos diminutos de clulas sanguneas. Cuando una persona sufre una lesin, las plaquetas se agrupan en la zona lesionada para formar un cogulo de [place]. Esto ayuda a Therapist, music y es el principio del proceso de Editor, commissioning. Si tiene [employer], es posible que su sangre tenga problemas para Biochemist, clinical. Esto puede causar sangrado y moretones con mucha facilidad. Tal vez deba recibir una transfusin de plaquetas si tiene una enfermedad que reduce el nmero de plaquetas (trombocitopenia). Se puede usar una transfusin de plaquetas para detener o evitar el sangrado excesivo. Informe al mdico acerca de lo siguiente:  Las reacciones que haya tenido durante transfusiones previas.  Cualquier alergia que tenga.  Todos los [employer], incluidos vitaminas, hierbas, gotas oftlmicas, cremas y [address] de [address].  Cualquier enfermedad de la sangre que tenga.  Cirugas previas a las que se someti.  Cualquier afeccin mdica que tenga.  Si est embarazada o podra estarlo. Cules son los riesgos? En general, se trata de un procedimiento seguro. Sin embargo, pueden ocurrir complicaciones, por ejemplo:  Grant Ruts.  Infeccin.  Reaccin alrgica a las plaquetas del donante.  Que el sistema de su cuerpo que combate las enfermedades (sistema inmunitario) ataque las plaquetas del donante (reaccin hemoltica). Esto es poco frecuente.  Una reaccin poco frecuente que causa dao pulmonar (lesin pulmonar aguda producida por transfusin). Qu ocurre antes del procedimiento? Medicamentos  Consulte al mdico sobre: ? Multimedia programmer o suspender los medicamentos que toma habitualmente. Esto es muy importante si toma medicamentos para la diabetes o anticoagulantes. ? Tomar medicamentos como aspirina e ibuprofeno. Estos medicamentos pueden tener un efecto anticoagulante en la [place]. No tome estos medicamentos a menos que el mdico se lo  indique. ? Tomar medicamentos de [employer], vitaminas, hierbas y suplementos. Instrucciones generales  Se le realizar un anlisis de sangre para determinar su grupo sanguneo. Su tipo de sangre determina qu tipo de plaquetas recibir.  Siga las indicaciones del mdico respecto de las restricciones para las comidas o las bebidas.  Si tuvo una reaccin alrgica a una transfusin en el pasado, tal vez le administren un medicamento que ayude a evitarla.  Le controlarn la temperatura, la presin arterial, el pulso y la respiracin. Qu ocurre durante el procedimiento?   Le colocarn una va intravenosa en una de las venas.  Por su seguridad, dos mdicos verificarn su identidad y las plaquetas del donante que se le van a infundir.  Una bolsa con las plaquetas del donante se conectar a su va intravenosa. Las plaquetas ingresarn en su torrente sanguneo. Esto generalmente demora entre 30 y 60 minutos.  Durante la transfusin, le controlarn la temperatura, la presin arterial, el pulso y la respiracin. Esto ayuda a detectar signos tempranos de una reaccin.  Tambin lo controlarn para Engineer, manufacturing otros sntomas que puedan indicar una reaccin, como escalofros, urticaria o picazn.  Si tiene signos de Astronomer, se suspender la transfusin y tal vez le administren un medicamento para Animator.  Una vez finalizada la transfusin, se retirar la va intravenosa.  Se puede aplicar presin en el lugar de la va intravenosa (i.v.) para detener cualquier sangrado.  El lugar de la va intravenosa (i.v.) se cubrir con un  vendaje. Este procedimiento puede variar segn el mdico y el hospital. Sander Nephew sucede despus del procedimiento?  Le controlarn la presin arterial, la temperatura, el pulso y la respiracin hasta que abandone el hospital o la clnica.  Es posible que tenga algunos moretones y Corporate treasurer en el lugar de la va intravenosa (i.v.). Siga  estas indicaciones en su casa: Medicamentos  Tome los medicamentos de venta libre y los recetados solamente como se lo haya indicado el mdico.  Hable con el mdico antes de tomar cualquier medicamento que contenga aspirina o antiinflamatorios no esteroideos (AINE). Estos medicamentos aumentan el riesgo de hemorragias peligrosas. Instrucciones generales  Cambie o retire Forensic scientist se lo haya indicado el mdico.  Retome sus actividades normales como se lo haya indicado el mdico. Pregntele al mdico qu actividades son seguras para usted.  No tome baos de inmersin, no nade ni use el jacuzzi hasta que el mdico lo autorice. Pregntele al mdico si puede ducharse.  Controle el lugar de la va intravenosa (i.v.) [employer] para descartar signos de infeccin. Est atento a los siguientes signos: ? Dolor, hinchazn o enrojecimiento. ? Lquido o sangre. Si sale lquido o sangre del Environmental consultant de la va intravenosa (i.v.), ejerza presin firme con las manos sobre el vendaje que cubre la zona durante uno o dos minutos. Al hacerlo, debera detenerse el sangrado. ? Calor. ? Pus o mal olor.  Concurra a todas las visitas de control como se lo haya indicado el mdico. Esto es importante. Comunquese con un mdico si tiene:  Dolor de cabeza que no se alivia con medicamentos.  Ronchas, erupcin o picazn en la piel.  Nuseas o vmitos.  Cansancio o debilidad inusuales.  Signos de infeccin en el lugar de la va intravenosa (i.v.). Solicite ayuda de inmediato si:  Tiene fiebre o siente escalofros.  Orina con menos frecuencia que lo habitual.  La orina es de un color ms oscuro que lo normal.  Tiene alguno de los siguientes sntomas: ? Dificultad para Ambulance person. ? Dolor en la espalda, el abdomen o el pecho. ? Piel fra y hmeda. ? Latidos cardacos acelerados. Resumen  Las plaquetas son fragmentos diminutos de clulas sanguneas que se agrupan para formar un cogulo de sangre  cuando se produce una herida. Si tiene [employer], es posible que su sangre tenga problemas para Designer, fashion/clothing.  La transfusin de plaquetas es un procedimiento en el cual se reciben plaquetas de un donante a travs de una va intravenosa.  Se puede usar una transfusin de plaquetas para detener o evitar el sangrado excesivo.  Despus del procedimiento, revise el lugar de la va intravenosa (i.v.) todos los das para detectar signos de infeccin, como enrojecimiento, hinchazn, dolor o calor. Esta informacin no tiene Marine scientist el consejo del mdico. Asegrese de hacerle al mdico cualquier pregunta que tenga. Document Revised: 08/03/2017 Document Reviewed: 08/03/2017 Elsevier Patient Education  2020 [employer].

## 2019-07-25 NOTE — Progress Notes (Signed)
CRITICAL VALUE ALERT  Critical Value:  Hgb 6.7 Plt less than 5  Date & Time Notied:  07/25/19 at 1043  Provider Notified: Benetta Spar RN with Dr. Leonides Schanz  Orders Received/Actions taken: RN verbalized understanding and will alert MD

## 2019-07-26 ENCOUNTER — Telehealth: Payer: Self-pay

## 2019-07-26 LAB — PREPARE PLATELET PHERESIS: Unit division: 0

## 2019-07-26 LAB — BPAM RBC
Blood Product Expiration Date: 202103172359
ISSUE DATE / TIME: 202102151235
Unit Type and Rh: 5100

## 2019-07-26 LAB — BPAM PLATELET PHERESIS
Blood Product Expiration Date: 202102182359
ISSUE DATE / TIME: 202102151439
Unit Type and Rh: 6200

## 2019-07-26 LAB — TYPE AND SCREEN
ABO/RH(D): O POS
Antibody Screen: NEGATIVE
Unit division: 0

## 2019-07-26 NOTE — Telephone Encounter (Signed)
TC from [employer] from [facility]. [facility] inquiring if Pt was still on his way to [facility] today. Informed Mardella Layman Pt was here yesterday and has an appointment tomorrow for blood transfusion. Informed Mardella Layman Per Raynelle Fanning Interpreter Pt will not relocate until next week 08/03/19. Mardella Layman stated she would like Lonna Duval RN Dr. Derek Mound Nurse to give her a call. In basket message sent to [facility].

## 2019-07-27 ENCOUNTER — Other Ambulatory Visit: Payer: Self-pay

## 2019-07-27 ENCOUNTER — Inpatient Hospital Stay: Payer: Self-pay

## 2019-07-27 DIAGNOSIS — Z95828 Presence of other vascular implants and grafts: Secondary | ICD-10-CM

## 2019-07-27 DIAGNOSIS — Z452 Encounter for adjustment and management of vascular access device: Secondary | ICD-10-CM

## 2019-07-27 DIAGNOSIS — D619 Aplastic anemia, unspecified: Secondary | ICD-10-CM

## 2019-07-27 DIAGNOSIS — D61818 Other pancytopenia: Secondary | ICD-10-CM

## 2019-07-27 LAB — CBC WITH DIFFERENTIAL (CANCER CENTER ONLY)
Abs Immature Granulocytes: 0 10*3/uL (ref 0.00–0.07)
Basophils Absolute: 0 10*3/uL (ref 0.0–0.1)
Basophils Relative: 0 %
Eosinophils Absolute: 0 10*3/uL (ref 0.0–0.5)
Eosinophils Relative: 1 %
HCT: 21.9 % — ABNORMAL LOW (ref 39.0–52.0)
Hemoglobin: 7.4 g/dL — ABNORMAL LOW (ref 13.0–17.0)
Immature Granulocytes: 0 %
Lymphocytes Relative: 49 %
Lymphs Abs: 0.9 10*3/uL (ref 0.7–4.0)
MCH: 29.1 pg (ref 26.0–34.0)
MCHC: 33.8 g/dL (ref 30.0–36.0)
MCV: 86.2 fL (ref 80.0–100.0)
Monocytes Absolute: 0.3 10*3/uL (ref 0.1–1.0)
Monocytes Relative: 13 %
Neutro Abs: 0.7 10*3/uL — ABNORMAL LOW (ref 1.7–7.7)
Neutrophils Relative %: 37 %
Platelet Count: 25 10*3/uL — ABNORMAL LOW (ref 150–400)
RBC: 2.54 MIL/uL — ABNORMAL LOW (ref 4.22–5.81)
RDW: 15.4 % (ref 11.5–15.5)
WBC Count: 1.9 10*3/uL — ABNORMAL LOW (ref 4.0–10.5)
nRBC: 0 % (ref 0.0–0.2)

## 2019-07-27 LAB — SAMPLE TO BLOOD BANK

## 2019-07-27 LAB — TYPE AND SCREEN
ABO/RH(D): O POS
Antibody Screen: NEGATIVE

## 2019-07-27 MED ORDER — HEPARIN SOD (PORK) LOCK FLUSH 100 UNIT/ML IV SOLN
250.0000 [IU] | Freq: Once | INTRAVENOUS | Status: AC
  Administered 2019-07-27: 250 [IU] via INTRAVENOUS
  Filled 2019-07-27: qty 5

## 2019-07-27 MED ORDER — SODIUM CHLORIDE 0.9% FLUSH
10.0000 mL | Freq: Once | INTRAVENOUS | Status: AC
  Administered 2019-07-27: 10 mL
  Filled 2019-07-27: qty 10

## 2019-07-27 NOTE — Progress Notes (Signed)
Pt's Hbg is 7.4 & PLT are 25 today, pt does not need transfusion per Dr. Leonides Schanz.

## 2019-07-28 ENCOUNTER — Telehealth: Payer: Self-pay | Admitting: *Deleted

## 2019-07-28 NOTE — Telephone Encounter (Signed)
Received call from Lindsay [facility] in [place], [place]. She is aware that patient has not yet left [place]. Informed her that he now has actual tickets for his train transportation and is scheduled to leave on 08/03/19. Informed her that we would be seeing him for blood and platelet transfusions on 08/02/19. She states he has an appt with Dr. 08/04/19 on 08/10/19 @ 1pm. Advised that patient is likely to need both RBC and platelet transfusions by then. She states she will advise the MD of this.   10/10/19 also asked for last office note to be fax'd to her. Fax # is [phone]. I told her I would do this. Also provided her with pt's cell phone #   Advised we would stay in touch as needed.

## 2019-07-29 ENCOUNTER — Inpatient Hospital Stay: Payer: Self-pay

## 2019-07-29 ENCOUNTER — Other Ambulatory Visit: Payer: Self-pay

## 2019-07-29 DIAGNOSIS — D619 Aplastic anemia, unspecified: Secondary | ICD-10-CM

## 2019-07-29 LAB — CBC WITH DIFFERENTIAL (CANCER CENTER ONLY)
Abs Immature Granulocytes: 0.01 10*3/uL (ref 0.00–0.07)
Basophils Absolute: 0 10*3/uL (ref 0.0–0.1)
Basophils Relative: 0 %
Eosinophils Absolute: 0 10*3/uL (ref 0.0–0.5)
Eosinophils Relative: 1 %
HCT: 21 % — ABNORMAL LOW (ref 39.0–52.0)
Hemoglobin: 6.9 g/dL — CL (ref 13.0–17.0)
Immature Granulocytes: 1 %
Lymphocytes Relative: 45 %
Lymphs Abs: 0.9 10*3/uL (ref 0.7–4.0)
MCH: 29 pg (ref 26.0–34.0)
MCHC: 32.9 g/dL (ref 30.0–36.0)
MCV: 88.2 fL (ref 80.0–100.0)
Monocytes Absolute: 0.2 10*3/uL (ref 0.1–1.0)
Monocytes Relative: 13 %
Neutro Abs: 0.7 10*3/uL — ABNORMAL LOW (ref 1.7–7.7)
Neutrophils Relative %: 40 %
Platelet Count: 11 10*3/uL — ABNORMAL LOW (ref 150–400)
RBC: 2.38 MIL/uL — ABNORMAL LOW (ref 4.22–5.81)
RDW: 15.2 % (ref 11.5–15.5)
WBC Count: 1.9 10*3/uL — ABNORMAL LOW (ref 4.0–10.5)
nRBC: 0 % (ref 0.0–0.2)

## 2019-07-29 LAB — SAMPLE TO BLOOD BANK

## 2019-07-29 LAB — PREPARE RBC (CROSSMATCH)

## 2019-07-29 MED ORDER — SODIUM CHLORIDE 0.9% IV SOLUTION
250.0000 mL | Freq: Once | INTRAVENOUS | Status: AC
  Administered 2019-07-29: 11:00:00 250 mL via INTRAVENOUS
  Filled 2019-07-29: qty 250

## 2019-07-29 MED ORDER — SODIUM CHLORIDE 0.9% IV SOLUTION
250.0000 mL | Freq: Once | INTRAVENOUS | Status: DC
  Filled 2019-07-29: qty 250

## 2019-07-29 MED ORDER — ACETAMINOPHEN 325 MG PO TABS
650.0000 mg | ORAL_TABLET | Freq: Once | ORAL | Status: AC
  Administered 2019-07-29: 11:00:00 650 mg via ORAL

## 2019-07-29 MED ORDER — HEPARIN SOD (PORK) LOCK FLUSH 100 UNIT/ML IV SOLN
250.0000 [IU] | INTRAVENOUS | Status: AC | PRN
  Administered 2019-07-29: 15:00:00 250 [IU]
  Filled 2019-07-29: qty 5

## 2019-07-29 MED ORDER — ACETAMINOPHEN 325 MG PO TABS
ORAL_TABLET | ORAL | Status: AC
  Filled 2019-07-29: qty 2

## 2019-07-29 MED ORDER — SODIUM CHLORIDE 0.9% FLUSH
3.0000 mL | INTRAVENOUS | Status: AC | PRN
  Administered 2019-07-29: 3 mL
  Filled 2019-07-29: qty 10

## 2019-07-29 MED ORDER — DIPHENHYDRAMINE HCL 25 MG PO CAPS
ORAL_CAPSULE | ORAL | Status: AC
  Filled 2019-07-29: qty 1

## 2019-07-29 MED ORDER — SODIUM CHLORIDE 0.9% FLUSH
3.0000 mL | INTRAVENOUS | Status: DC | PRN
  Filled 2019-07-29: qty 10

## 2019-07-29 MED ORDER — DIPHENHYDRAMINE HCL 25 MG PO CAPS
25.0000 mg | ORAL_CAPSULE | Freq: Once | ORAL | Status: AC
  Administered 2019-07-29: 25 mg via ORAL

## 2019-07-29 MED ORDER — HEPARIN SOD (PORK) LOCK FLUSH 100 UNIT/ML IV SOLN
250.0000 [IU] | INTRAVENOUS | Status: DC | PRN
  Filled 2019-07-29: qty 5

## 2019-07-29 NOTE — Patient Instructions (Signed)

## 2019-07-29 NOTE — Patient Instructions (Signed)
PICC Home Care Guide  A peripherally inserted central catheter (PICC) is a form of IV access that allows medicines and IV fluids to be quickly distributed throughout the body. The PICC is a long, thin, flexible tube (catheter) that is inserted into a vein in the upper arm. The catheter ends in a large vein in the chest (superior vena cava, or SVC). After the PICC is inserted, a chest X-ray may be done to make sure that it is in the correct place. A PICC may be placed for different reasons, such as:  To give medicines and liquid nutrition.  To give IV fluids and blood products.  If there is trouble placing a peripheral intravenous (PIV) catheter. If taken care of properly, a PICC can remain in place for several months. Having a PICC can also allow a person to go home from the hospital sooner. Medicine and PICC care can be managed at home by a family member, caregiver, or home health care team. What are the risks? Generally, having a PICC is safe. However, problems may occur, including:  A blood clot (thrombus) forming in or at the tip of the PICC.  A blood clot forming in a vein (deep vein thrombosis) or traveling to the lung (pulmonary embolism).  Inflammation of the vein (phlebitis) in which the PICC is placed.  Infection. Central line associated blood stream infection (CLABSI) is a serious infection that often requires hospitalization.  PICC movement (malposition). The PICC tip may move from its original position due to excessive physical activity, forceful coughing, sneezing, or vomiting.  A break or cut in the PICC. It is important not to use scissors near the PICC.  Nerve or tendon irritation or injury during PICC insertion. How to take care of your PICC Preventing problems  You and any caregivers should wash your hands often with soap. Wash hands: ? Before touching the PICC line or the infusion device. ? Before changing a bandage (dressing).  Flush the PICC as told by your  health care provider. Let your health care provider know right away if the PICC is hard to flush or does not flush. Do not use force to flush the PICC.  Do not use a syringe that is less than 10 mL to flush the PICC.  Avoid blood pressure checks on the arm in which the PICC is placed.  Never pull or tug on the PICC.  Do not take the PICC out yourself. Only a trained clinical professional should remove the PICC.  Use clean and sterile supplies only. Keep the supplies in a dry place. Do not reuse needles, syringes, or any other supplies. Doing that can lead to infection.  Keep pets and children away from your PICC line.  Check the PICC insertion site every day for signs of infection. Check for: ? Leakage. ? Redness, swelling, or pain. ? Fluid or blood. ? Warmth. ? Pus or a bad smell. PICC dressing care  Keep your PICC bandage (dressing) clean and dry to prevent infection.  Do not take baths, swim, or use a hot tub until your health care provider approves. Ask your health care provider if you can take showers. You may only be allowed to take sponge baths for bathing. When you are allowed to shower: ? Ask your health care provider to teach you how to wrap the PICC line. ? Cover the PICC line with clear plastic wrap and tape to keep it dry while showering.  Follow instructions from your health care provider   about how to take care of your insertion site and dressing. Make sure you: ? Wash your hands with soap and water before you change your bandage (dressing). If soap and water are not available, use hand sanitizer. ? Change your dressing as told by your health care provider. ? Leave stitches (sutures), skin glue, or adhesive strips in place. These skin closures may need to stay in place for 2 weeks or longer. If adhesive strip edges start to loosen and curl up, you may trim the loose edges. Do not remove adhesive strips completely unless your health care provider tells you to do  that.  Change your PICC dressing if it becomes loose or wet. General instructions   Carry your PICC identification card or wear a medical alert bracelet at all times.  Keep the tube clamped at all times, unless it is being used.  Carry a smooth-edge clamp with you at all times to place on the tube if it breaks.  Do not use scissors or sharp objects near the tube.  You may bend your arm and move it freely. If your PICC is near or at the bend of your elbow, avoid activity with repeated motion at the elbow.  Avoid lifting heavy objects as told by your health care provider.  Keep all follow-up visits as told by your health care provider. This is important. Disposal of supplies  Throw away any syringes in a disposal container that is meant for sharp items (sharps container). You can buy a sharps container from a pharmacy, or you can make one by using an empty hard plastic bottle with a cover.  Place any used dressings or infusion bags into a plastic bag. Throw that bag in the trash. Contact a health care provider if:  You have pain in your arm, ear, face, or teeth.  You have a fever or chills.  You have redness, swelling, or pain around the insertion site.  You have fluid or blood coming from the insertion site.  Your insertion site feels warm to the touch.  You have pus or a bad smell coming from the insertion site.  Your skin feels hard and raised around the insertion site. Get help right away if:  Your PICC is accidentally pulled all the way out. If this happens, cover the insertion site with a bandage or gauze dressing. Do not throw the PICC away. Your health care provider will need to check it.  Your PICC was tugged or pulled and has partially come out. Do not  push the PICC back in.  You cannot flush the PICC, it is hard to flush, or the PICC leaks around the insertion site when it is flushed.  You hear a "flushing" sound when the PICC is flushed.  You feel your  heart racing or skipping beats.  There is a hole or tear in the PICC.  You have swelling in the arm in which the PICC was inserted.  You have a red streak going up your arm from where the PICC was inserted. Summary  A peripherally inserted central catheter (PICC) is a long, thin, flexible tube (catheter) that is inserted into a vein in the upper arm.  The PICC is inserted using a sterile technique by a specially trained nurse or physician. Only a trained clinical professional should remove it.  Keep your PICC identification card with you at all times.  Avoid blood pressure checks on the arm in which the PICC is placed.  If cared for   properly, a PICC can remain in place for several months. Having a PICC can also allow a person to go home from the hospital sooner. This information is not intended to replace advice given to you by your health care provider. Make sure you discuss any questions you have with your health care provider. Document Revised: 05/08/2017 Document Reviewed: 06/28/2016 Elsevier Patient Education  2020 Elsevier Inc.  

## 2019-07-30 LAB — PREPARE PLATELET PHERESIS: Unit division: 0

## 2019-07-30 LAB — TYPE AND SCREEN
ABO/RH(D): O POS
Antibody Screen: NEGATIVE
Unit division: 0

## 2019-07-30 LAB — BPAM RBC
Blood Product Expiration Date: 202103202359
ISSUE DATE / TIME: 202102191224
Unit Type and Rh: 5100

## 2019-07-30 LAB — BPAM PLATELET PHERESIS
Blood Product Expiration Date: 202102202359
ISSUE DATE / TIME: 202102191132
Unit Type and Rh: 6200

## 2019-08-01 ENCOUNTER — Inpatient Hospital Stay: Payer: Self-pay

## 2019-08-02 ENCOUNTER — Inpatient Hospital Stay: Payer: Self-pay

## 2019-08-02 ENCOUNTER — Other Ambulatory Visit: Payer: Self-pay

## 2019-08-02 ENCOUNTER — Other Ambulatory Visit: Payer: Self-pay | Admitting: *Deleted

## 2019-08-02 DIAGNOSIS — Z95828 Presence of other vascular implants and grafts: Secondary | ICD-10-CM

## 2019-08-02 DIAGNOSIS — D619 Aplastic anemia, unspecified: Secondary | ICD-10-CM

## 2019-08-02 DIAGNOSIS — D696 Thrombocytopenia, unspecified: Secondary | ICD-10-CM

## 2019-08-02 DIAGNOSIS — D61818 Other pancytopenia: Secondary | ICD-10-CM

## 2019-08-02 LAB — CBC WITH DIFFERENTIAL (CANCER CENTER ONLY)
Abs Immature Granulocytes: 0.01 10*3/uL (ref 0.00–0.07)
Basophils Absolute: 0 10*3/uL (ref 0.0–0.1)
Basophils Relative: 0 %
Eosinophils Absolute: 0 10*3/uL (ref 0.0–0.5)
Eosinophils Relative: 1 %
HCT: 22.2 % — ABNORMAL LOW (ref 39.0–52.0)
Hemoglobin: 7.3 g/dL — ABNORMAL LOW (ref 13.0–17.0)
Immature Granulocytes: 1 %
Lymphocytes Relative: 40 %
Lymphs Abs: 0.7 10*3/uL (ref 0.7–4.0)
MCH: 29.6 pg (ref 26.0–34.0)
MCHC: 32.9 g/dL (ref 30.0–36.0)
MCV: 89.9 fL (ref 80.0–100.0)
Monocytes Absolute: 0.3 10*3/uL (ref 0.1–1.0)
Monocytes Relative: 17 %
Neutro Abs: 0.7 10*3/uL — ABNORMAL LOW (ref 1.7–7.7)
Neutrophils Relative %: 41 %
Platelet Count: <5 10*3/uL — CL (ref 150–400)
RBC: 2.47 MIL/uL — ABNORMAL LOW (ref 4.22–5.81)
RDW: 14.7 % (ref 11.5–15.5)
WBC Count: 1.7 10*3/uL — ABNORMAL LOW (ref 4.0–10.5)
nRBC: 0 % (ref 0.0–0.2)

## 2019-08-02 LAB — PREPARE RBC (CROSSMATCH)

## 2019-08-02 LAB — SAMPLE TO BLOOD BANK

## 2019-08-02 MED ORDER — SODIUM CHLORIDE 0.9 % IV SOLN
Freq: Once | INTRAVENOUS | Status: AC
  Filled 2019-08-02: qty 250

## 2019-08-02 MED ORDER — ACETAMINOPHEN 325 MG PO TABS
ORAL_TABLET | ORAL | Status: AC
  Filled 2019-08-02: qty 2

## 2019-08-02 MED ORDER — DIPHENHYDRAMINE HCL 25 MG PO CAPS
25.0000 mg | ORAL_CAPSULE | Freq: Once | ORAL | Status: AC
  Administered 2019-08-02: 25 mg via ORAL

## 2019-08-02 MED ORDER — SODIUM CHLORIDE 0.9% FLUSH
10.0000 mL | INTRAVENOUS | Status: AC | PRN
  Administered 2019-08-02: 10 mL
  Filled 2019-08-02: qty 10

## 2019-08-02 MED ORDER — HEPARIN SOD (PORK) LOCK FLUSH 100 UNIT/ML IV SOLN
500.0000 [IU] | Freq: Every day | INTRAVENOUS | Status: AC | PRN
  Administered 2019-08-02: 500 [IU]
  Filled 2019-08-02: qty 5

## 2019-08-02 MED ORDER — ACETAMINOPHEN 325 MG PO TABS
650.0000 mg | ORAL_TABLET | Freq: Once | ORAL | Status: AC
  Administered 2019-08-02: 650 mg via ORAL

## 2019-08-02 MED ORDER — SODIUM CHLORIDE 0.9% FLUSH
3.0000 mL | INTRAVENOUS | Status: AC | PRN
  Administered 2019-08-02: 3 mL
  Filled 2019-08-02: qty 10

## 2019-08-02 MED ORDER — SODIUM CHLORIDE 0.9% IV SOLUTION
250.0000 mL | Freq: Once | INTRAVENOUS | Status: AC
  Administered 2019-08-02: 250 mL via INTRAVENOUS
  Filled 2019-08-02: qty 250

## 2019-08-02 MED ORDER — DIPHENHYDRAMINE HCL 25 MG PO CAPS
ORAL_CAPSULE | ORAL | Status: AC
  Filled 2019-08-02: qty 1

## 2019-08-02 MED ORDER — SODIUM CHLORIDE 0.9% FLUSH
10.0000 mL | Freq: Once | INTRAVENOUS | Status: AC
  Administered 2019-08-02: 10 mL
  Filled 2019-08-02: qty 10

## 2019-08-02 MED ORDER — HEPARIN SOD (PORK) LOCK FLUSH 100 UNIT/ML IV SOLN
250.0000 [IU] | INTRAVENOUS | Status: AC | PRN
  Administered 2019-08-02: 250 [IU]
  Filled 2019-08-02: qty 5

## 2019-08-02 NOTE — Progress Notes (Signed)
Received call from lab that patient has a platelet count less than 5.  Hand delivered result to desk RN and Dr. Leonides Schanz. Lorayne Marek, RN

## 2019-08-02 NOTE — Progress Notes (Signed)
Per Dr. Leonides Schanz, ok to run blood transfusions at 300-350 cc/hour as tolerable for pt.

## 2019-08-02 NOTE — Patient Instructions (Addendum)
https://www.redcrossblood.org/donate-blood/blood-donation-process/what-happens-to-donated-blood/blood-transfusions/types-of-blood-transfusions.html"> https://www.hematology.org/education/patients/blood-basics/blood-safety-and-matching"> https://www.nhlbi.nih.gov/health-topics/blood-transfusion">  Transfusin de [employer] adultos Blood Transfusion, Adult Una transfusin de sangre es un procedimiento en el que se recibe sangre o un tipo de clulas sanguneas (componentes sanguneos) a travs de una va intravenosa. Es posible que necesite una transfusin de sangre cuando su nivel de sangre sea [place]. Esto puede deberse a un trastorno hemorrgico, una enfermedad, lesin o [place]. La sangre puede provenir de un donante. Tambin se puede donar sangre para uno mismo (donacin de [place]) antes de [place] programada. La sangre administrada en una transfusin est formada por diferentes componentes sanguneos. Puede recibir lo siguiente:  Glbulos rojos. Transportan el oxgeno a las clulas del organismo.  Plaquetas. Ayudan a la Musician.  Plasma. Esta es la parte lquida de la [place]. Transporta protenas y otras sustancias a todo el cuerpo.  Glbulos blancos. Ayudan a combatir las infecciones. Si tiene hemofilia u otro trastorno de [place], tambin puede recibir otro tipo de hemoderivados. Informe al mdico acerca de lo siguiente:  Cualquier trastorno de la sangre que tenga.  Cualquier reaccin que haya tenido durante una transfusin de [place].  Cualquier alergia que tenga.  Todos los [employer], incluidos vitaminas, hierbas, gotas oftlmicas, cremas y medicamentos de venta libre.  Cirugas a las que se haya sometido.  Cualquier afeccin que tenga, incluidos sntomas recientes de resfriado o fiebre.  Si est embarazada o podra estarlo. Cules son los riesgos? En general, se trata de un procedimiento seguro. Sin embargo, pueden ocurrir  complicaciones.  Los problemas ms frecuentes son: ? Una reaccin alrgica leve, como zonas de piel hinchadas y enrojecidas (urticaria) y picazn. ? Fiebre o escalofros. Esta puede ser la respuesta del cuerpo a las nuevas clulas sanguneas recibidas. Puede ocurrir [place] 4 horas despus de la transfusin.  Los problemas ms graves pueden incluir: ? Cabin crew circulatoria asociada a una transfusin (SCAT) o demasiado lquido en los pulmones. Esto puede causar problemas respiratorios. ? Una reaccin alrgica grave, como dificultad para respirar o hinchazn alrededor de la cara y los labios. ? Lesin pulmonar aguda relacionada con una transfusin (LPART), que causa dificultades respiratorias y baja concentracin de oxgeno en la sangre. Esta lesin puede ocurrir unas horas o varios das despus de la transfusin. ? Sobrecarga de hierro. Esto puede ocurrir despus de recibir muchas transfusiones de sangre durante un perodo de [place]. ? Transmisin de infecciones o virus. Esto es poco frecuente porque la sangre del donante se analiza cuidadosamente antes de administrarla. ? Reaccin transfusional hemoltica. Esto es poco frecuente. Ocurre cuando el sistema de defensa del cuerpo (sistema inmunitario)intenta atacar a las clulas sanguneas nuevas. Los sntomas pueden incluir fiebre, escalofros, nuseas, presin arterial baja y Social research officer, government en la parte baja de la espalda o dolor de pecho. ? Enfermedad de injerto contra husped asociada a una transfusin (EICH-AT). Esto es poco frecuente. Se produce cuando las clulas del donante atacan los tejidos sanos del cuerpo del receptor. Qu ocurre antes del procedimiento? Medicamentos Consulte al mdico sobre:  Quarry manager o suspender los medicamentos que [place] habitualmente. Esto es muy importante si toma medicamentos para la diabetes o anticoagulantes.  Tomar medicamentos como aspirina e ibuprofeno. Estos medicamentos pueden tener un efecto anticoagulante  en la [place]. No tome estos medicamentos a menos que el mdico se lo indique.  Tomar medicamentos de [employer], vitaminas, hierbas y suplementos. Instrucciones generales  Siga las instrucciones del mdico respecto de las restricciones de comidas y bebidas.  Se le realizar un anlisis  de sangre para determinar su grupo sanguneo. Esto es necesario para saber qu clase de sangre aceptar su cuerpo y para que coincida con la sangre del donante.  Si se someter a Naval architect, es posible que pueda realizar una donacin de [place]. Esto se Psychologist, occupational de que necesite una transfusin.  Le controlarn la temperatura, la presin arterial y el pulso antes de la transfusin.  Si tuvo una reaccin alrgica a una transfusin en el pasado, tal vez le administren un medicamento que ayude a evitarla. Este medicamento se puede administrar por la boca (por va oral) o a travs de una va intravenosa.  Reserve un tiempo para la transfusin de [place]. Este procedimiento generalmente demora de 1 a 4 horas en completarse. Qu ocurre durante el procedimiento?   Le colocarn una va intravenosa en una vena.  La bolsa de sangre donada se conectar a la va intravenosa. La sangre luego se Arts administrator en la vena.  Le controlarn la temperatura, la presin arterial y el pulso peridicamente durante la transfusin. Este control se realiza para detectar signos tempranos de una reaccin a la transfusin.  Informe de inmediato al enfermero si tiene alguno de estos sntomas durante la transfusin: ? Falta de aire o dificultad para respirar. ? Dolor de pecho o de espalda. ? Grant Ruts o escalofros. ? Urticaria o picazn.  Si tiene signos o sntomas de Corporate treasurer, se suspender la transfusin y tal vez le administren un medicamento.  Una vez finalizada la transfusin, le retirarn la va intravenosa.  Se puede aplicar presin en el lugar donde se coloc la va intravenosa.  Le  colocarn una venda (vendaje). El procedimiento puede variar segn el mdico y el hospital. Ladell Heads ocurre despus del procedimiento?  Le controlarn la temperatura, la presin arterial, el pulso, la frecuencia respiratoria y Air cabin crew de oxgeno en la sangre hasta que le den el alta del hospital o la clnica.  Le analizarn su sangre para saber cmo est respondiendo a la transfusin.  Es posible que lo calienten con lquidos o mantas para Pharmacologist una Arts development officer normal.  Si recibe la transfusin de sangre en un entorno ambulatorio, le indicarn con quin debe ponerse en contacto para informar cualquier reaccin. Dnde buscar ms informacin Para obtener ms informacin sobre transfusiones de [place], visite la [employer] American[employer]): redcross.org Resumen  Una transfusin de sangre es un procedimiento en el que se recibe sangre o un tipo de clulas sanguneas (componentes sanguneos) a travs de una va intravenosa.  La sangre que recibe puede provenir de un donante o de usted mismo (donacin de sangre Programme researcher, broadcasting/film/video) antes de una ciruga programada.  La sangre administrada en una transfusin est formada por diferentes componentes sanguneos. Puede recibir glbulos rojos, plaquetas, plasma o glbulos blancos, segn la afeccin que se vaya a tratar.  Le controlarn la temperatura, la presin arterial y el pulso antes, durante y despus de la transfusin.  Despus de la transfusin, pueden analizarle la sangre para determinar cmo ha respondido su organismo. Esta informacin no tiene Theme park manager el consejo del mdico. Asegrese de hacerle al mdico cualquier pregunta que tenga. Document Revised: 01/12/2019 Document Reviewed: 01/12/2019 Elsevier Patient Education  2020 Elsevier Inc.    Trombocitopenia Thrombocytopenia La trombocitopenia es la cantidad baja de plaquetas en la [place]. Las plaquetas son clulas diminutas de Risk manager. Cuando uno sangra, se juntan  en el lugar del corte o la lesin para Therapist, music. Esto se  llama coagulacin sangunea. Una cantidad insuficiente de plaquetas puede causar problemas de sangrado. Algunos casos de esta afeccin son leves, mientras que otros son ms graves. Cules son las causas? Esta afeccin puede ser causada por lo siguiente:  El cuerpo no produce la cantidad suficiente de plaquetas. Las causas de esto pueden ser las siguientes: ? La mdula sea no produce clulas sanguneas (anemia aplsica). ? Cncer de la mdula sea. ? Ciertos medicamentos. ? Infeccin de la mdula sea. ? Beber mucho alcohol.  El cuerpo destruye las plaquetas demasiado rpido. Las causas de esto pueden ser las siguientes: ? Ciertas enfermedades inmunitarias. ? Ciertos medicamentos. ? Ciertos trastornos de Air cabin crew de Herbalist. ? Ciertos trastornos que se transmiten de padres a hijos (hereditarios). ? Ciertos trastornos de Air cabin crew. ? Embarazo. ? Tener el bazo ms grande de lo normal. Cules son los signos o los sntomas?  Sangrado que no es normal.  Hemorragias nasales.  Perodos menstruales abundantes.  Sangre en el pis (orina) o en la materia fecal (heces).  Piel de color morado (prpura).  Moretones.  Una erupcin con aspecto de puntos de color rojo prpura (petequias). Cmo se trata?  El tratamiento de otra afeccin que est causando el bajo recuento de plaquetas.  Medicamentos para ayudar a Systems analyst y Product/process development scientist que se destruyan.  Una reposicin (transfusin) de las plaquetas para Scientist, water quality o Recruitment consultant.  Ciruga para extirpar el bazo. Siga estas instrucciones en su casa: Actividad  Evite las actividades que podran causarle lesiones o moretones. Siga las instrucciones acerca de cmo evitar las cadas.  Tenga cuidado de no cortarse: ? Cuando se afeita. ? Cuando [place] tijeras, agujas, cuchillos u otras herramientas.  Tenga cuidado de no quemarse: ? Cuando [place] la  plancha. ? Cuando cocina. Instrucciones generales   Controle la piel y la parte de adentro de la boca para Hydrographic surveyor moretones o sangre como se lo haya indicado el mdico.  Verifique que no haya sangre en la saliva (esputo), la orina y las heces. Hgalo como se lo haya indicado el mdico.  No beba alcohol.  Tome los medicamentos de venta libre y los recetados solamente como se lo haya indicado el mdico.  No tome medicamentos que contengan aspirina o antiinflamatorios no esteroideos (AINE). Estos medicamentos pueden tener un efecto anticoagulante en la sangre y causarle sangrado.  Informe a todos sus mdicos que tiene Personnel officer. Asegrese de decirles tambin a su dentista y a Risk analyst. Comunquese con un mdico si:  Tiene moretones y no sabe por qu. Solicite ayuda inmediatamente si:  Tiene una hemorragia en cualquier lugar del cuerpo.  Observa sangre en la saliva, la orina o las heces. Resumen  La trombocitopenia es la cantidad baja de plaquetas en la sangre.  Las plaquetas son necesarias para la coagulacin de Herbalist.  Los sntomas de esta afeccin incluyen sangrado que no es normal y moretones.  Tenga cuidado de no cortarse ni quemarse. Esta informacin no tiene Marine scientist el consejo del mdico. Asegrese de hacerle al mdico cualquier pregunta que tenga. Document Revised: 04/12/2018 Document Reviewed: 04/12/2018 Elsevier Patient Education  [place] de plaquetas Platelet Transfusion La transfusin de plaquetas es un procedimiento en el cual se reciben plaquetas de un donante a travs de una va intravenosa. Las plaquetas son fragmentos diminutos de clulas sanguneas. Cuando una persona sufre una lesin, las plaquetas se agrupan en la zona lesionada para formar un cogulo de [place]. Esto ayuda a Scientist, water quality  el sangrado y es el principio del proceso de Editor, commissioning. Si tiene Dow Chemical, es posible que su sangre tenga  problemas para Biochemist, clinical. Esto puede causar sangrado y moretones con mucha facilidad. Tal vez deba recibir una transfusin de plaquetas si tiene una enfermedad que reduce el nmero de plaquetas (trombocitopenia). Se puede usar una transfusin de plaquetas para detener o evitar el sangrado excesivo. Informe al mdico acerca de lo siguiente:  Las reacciones que haya tenido durante transfusiones previas.  Cualquier alergia que tenga.  Todos los Walt Disney, incluidos vitaminas, hierbas, gotas oftlmicas, cremas y [address] de [address].  Cualquier enfermedad de la sangre que tenga.  Cirugas previas a las que se someti.  Cualquier afeccin mdica que tenga.  Si est embarazada o podra estarlo. Cules son los riesgos? En general, se trata de un procedimiento seguro. Sin embargo, pueden ocurrir complicaciones, por ejemplo:  Grant Ruts.  Infeccin.  Reaccin alrgica a las plaquetas del donante.  Que el sistema de su cuerpo que combate las enfermedades (sistema inmunitario) ataque las plaquetas del donante (reaccin hemoltica). Esto es poco frecuente.  Una reaccin poco frecuente que causa dao pulmonar (lesin pulmonar aguda producida por transfusin). Qu ocurre antes del procedimiento? Medicamentos  Consulte al mdico sobre: ? Multimedia programmer o suspender los medicamentos que toma habitualmente. Esto es muy importante si toma medicamentos para la diabetes o anticoagulantes. ? Tomar medicamentos como aspirina e ibuprofeno. Estos medicamentos pueden tener un efecto anticoagulante en la [place]. No tome estos medicamentos a menos que el mdico se lo indique. ? Tomar medicamentos de [employer], vitaminas, hierbas y suplementos. Instrucciones generales  Se le realizar un anlisis de sangre para determinar su grupo sanguneo. Su tipo de sangre determina qu tipo de plaquetas recibir.  Siga las indicaciones del mdico respecto de las restricciones para las comidas o las  bebidas.  Si tuvo una reaccin alrgica a una transfusin en el pasado, tal vez le administren un medicamento que ayude a evitarla.  Le controlarn la temperatura, la presin arterial, el pulso y la respiracin. Qu ocurre durante el procedimiento?   Le colocarn una va intravenosa en una de las venas.  Por su seguridad, dos mdicos verificarn su identidad y las plaquetas del donante que se le van a infundir.  Una bolsa con las plaquetas del donante se conectar a su va intravenosa. Las plaquetas ingresarn en su torrente sanguneo. Esto generalmente demora entre 30 y 60 minutos.  Durante la transfusin, le controlarn la temperatura, la presin arterial, el pulso y la respiracin. Esto ayuda a detectar signos tempranos de una reaccin.  Tambin lo controlarn para Engineer, manufacturing otros sntomas que puedan indicar una reaccin, como escalofros, urticaria o picazn.  Si tiene signos de Astronomer, se suspender la transfusin y tal vez le administren un medicamento para Animator.  Una vez finalizada la transfusin, se retirar la va intravenosa.  Se puede aplicar presin en el lugar de la va intravenosa (i.v.) para detener cualquier sangrado.  El lugar de la va intravenosa (i.v.) se cubrir con un vendaje. Este procedimiento puede variar segn el mdico y el hospital. Ladell Heads sucede despus del procedimiento?  Le controlarn la presin arterial, la temperatura, el pulso y la respiracin hasta que abandone el hospital o la clnica.  Es posible que tenga algunos moretones y Stage manager en el lugar de la va intravenosa (i.v.). Siga estas indicaciones en su casa: Medicamentos  [employer] de venta libre y los recetados solamente como  se lo haya indicado el mdico.  Hable con el mdico antes de tomar cualquier medicamento que contenga aspirina o antiinflamatorios no esteroideos (AINE). Estos medicamentos aumentan el riesgo de hemorragias  peligrosas. Instrucciones generales  Cambie o retire Office manager se lo haya indicado el mdico.  Retome sus actividades normales como se lo haya indicado el mdico. Pregntele al mdico qu actividades son seguras para usted.  No tome baos de inmersin, no nade ni use el jacuzzi hasta que el mdico lo autorice. Pregntele al mdico si puede ducharse.  Controle el lugar de la va intravenosa (i.v.) [employer] para descartar signos de infeccin. Est atento a los siguientes signos: ? Dolor, hinchazn o enrojecimiento. ? Lquido o sangre. Si sale lquido o sangre del Environmental consultant de la va intravenosa (i.v.), ejerza presin firme con las manos sobre el vendaje que cubre la zona durante uno o dos minutos. Al hacerlo, debera detenerse el sangrado. ? Calor. ? Pus o mal olor.  Concurra a todas las visitas de control como se lo haya indicado el mdico. Esto es importante. Comunquese con un mdico si tiene:  Dolor de cabeza que no se alivia con medicamentos.  Ronchas, erupcin o picazn en la piel.  Nuseas o vmitos.  Cansancio o debilidad inusuales.  Signos de infeccin en el lugar de la va intravenosa (i.v.). Solicite ayuda de inmediato si:  Tiene fiebre o siente escalofros.  Orina con menos frecuencia que lo habitual.  La orina es de un color ms oscuro que lo normal.  Tiene alguno de los siguientes sntomas: ? Dificultad para Industrial/product designer. ? Dolor en la espalda, el abdomen o el pecho. ? Piel fra y hmeda. ? Latidos cardacos acelerados. Resumen  Las plaquetas son fragmentos diminutos de clulas sanguneas que se agrupan para formar un cogulo de sangre cuando se produce una herida. Si tiene [employer] Chemical, es posible que su sangre tenga problemas para Biochemist, clinical.  La transfusin de plaquetas es un procedimiento en el cual se reciben plaquetas de un donante a travs de una va intravenosa.  Se puede usar una transfusin de plaquetas para detener o evitar el sangrado  excesivo.  Despus del procedimiento, revise el lugar de la va intravenosa (i.v.) todos los das para detectar signos de infeccin, como enrojecimiento, hinchazn, dolor o calor. Esta informacin no tiene Theme park manager el consejo del mdico. Asegrese de hacerle al mdico cualquier pregunta que tenga. Document Revised: 08/03/2017 Document Reviewed: 08/03/2017 Elsevier Patient Education  2020 Elsevier Inc.  Coronavirus (COVID-19) Are you at risk?  Are you at risk for the Coronavirus (COVID-19)?  To be considered HIGH RISK for Coronavirus (COVID-19), you have to meet the following criteria:  . Traveled to [place], [place], [place], [place] or [place]; or in the [place] to [place], [place], [place], or [place]; and have fever, cough, and shortness of breath within the last 2 weeks of travel OR . Been in close contact with a person diagnosed with COVID-19 within the last 2 weeks and have fever, cough, and shortness of breath . IF YOU DO NOT MEET THESE CRITERIA, YOU ARE CONSIDERED LOW RISK FOR COVID-19.   What to do if you are HIGH RISK for COVID-19?  Marland Kitchen If you are having a medical emergency, call 911. . Seek medical care right away. Before you go to a doctor's office, urgent care or emergency department, call ahead and tell them about your recent travel, contact with someone diagnosed with COVID-19, and your symptoms. You  should receive instructions from your physician's office regarding next steps of care.  . When you arrive at healthcare provider, tell the healthcare staff immediately you have returned from visiting [place], [place], [place], [place] or [place]; or traveled in the [place] to [place], [place], [place], or [place]; in the last two weeks or you have been in close contact with a person diagnosed with COVID-19 in the last 2 weeks.   . Tell the health care staff about your symptoms: fever, cough and shortness of breath. . After you have been seen by a  medical provider, you will be either: o Tested for (COVID-19) and discharged home on quarantine except to seek medical care if symptoms worsen, and asked to  - Stay home and avoid contact with others until you get your results (4-5 days)  - Avoid travel on public transportation if possible (such as bus, train, or airplane) or o Sent to the Emergency Department by EMS for evaluation, COVID-19 testing, and possible admission depending on your condition and test results.  What to do if you are LOW RISK for COVID-19?  Reduce your risk of any infection by using the same precautions used for avoiding the common cold or flu:  Marland Kitchen Wash your hands often with soap and warm water for at least 20 seconds.  If soap and water are not readily available, use an alcohol-based hand sanitizer with at least 60% alcohol.  . If coughing or sneezing, cover your mouth and nose by coughing or sneezing into the elbow areas of your shirt or coat, into a tissue or into your sleeve (not your hands). . Avoid shaking hands with others and consider head nods or verbal greetings only. . Avoid touching your eyes, nose, or mouth with unwashed hands.  . Avoid close contact with people who are sick. . Avoid places or events with large numbers of people in one location, like concerts or sporting events. . Carefully consider travel plans you have or are making. . If you are planning any travel outside or inside the [place], visit the CDC's Travelers' Health webpage for the latest health notices. . If you have some symptoms but not all symptoms, continue to monitor at home and seek medical attention if your symptoms worsen. . If you are having a medical emergency, call 911.   ADDITIONAL HEALTHCARE OPTIONS FOR PATIENTS  [facility] / e-Visit: [email or web address]         [facility]: [phone]  [facility]: [phone]                   [facility]: [phone]

## 2019-08-03 ENCOUNTER — Inpatient Hospital Stay: Payer: Self-pay

## 2019-08-03 LAB — BPAM RBC
Blood Product Expiration Date: 202103262359
Blood Product Expiration Date: 202103262359
ISSUE DATE / TIME: 202102231442
ISSUE DATE / TIME: 202102231442
Unit Type and Rh: 5100
Unit Type and Rh: 5100

## 2019-08-03 LAB — TYPE AND SCREEN
ABO/RH(D): O POS
Antibody Screen: NEGATIVE
Unit division: 0
Unit division: 0

## 2019-08-03 LAB — BPAM PLATELET PHERESIS
Blood Product Expiration Date: 202102242359
Blood Product Expiration Date: 202102262359
ISSUE DATE / TIME: 202102231315
ISSUE DATE / TIME: 202102231415
Unit Type and Rh: 7300
Unit Type and Rh: 7300

## 2019-08-03 LAB — PREPARE PLATELET PHERESIS
Unit division: 0
Unit division: 0

## 2019-08-05 ENCOUNTER — Inpatient Hospital Stay: Payer: Self-pay

## 2019-08-08 ENCOUNTER — Inpatient Hospital Stay: Payer: Self-pay

## 2019-08-10 ENCOUNTER — Inpatient Hospital Stay: Payer: Self-pay

## 2019-08-12 ENCOUNTER — Other Ambulatory Visit: Payer: Self-pay

## 2019-08-15 ENCOUNTER — Other Ambulatory Visit: Payer: Self-pay

## 2019-08-15 ENCOUNTER — Ambulatory Visit: Payer: Self-pay

## 2019-08-17 ENCOUNTER — Other Ambulatory Visit: Payer: Self-pay

## 2019-08-19 ENCOUNTER — Ambulatory Visit: Payer: Self-pay | Admitting: Hematology and Oncology

## 2019-08-19 ENCOUNTER — Other Ambulatory Visit: Payer: Self-pay

## 2020-10-23 IMAGING — CT CT ABDOMEN AND PELVIS WITH CONTRAST
2 of 5 series · 17 of 46 positions shown, 19 images · IV contrast (APPLIED)
Comparison: None.

CLINICAL DATA: Cirrhosis or Fatty Liver pancytopenia -- evaluate
for large liver or spleen

EXAM:
CT ABDOMEN AND PELVIS WITH CONTRAST
TECHNIQUE: Multidetector CT imaging of the abdomen and pelvis was performed
using the standard protocol following bolus administration of
intravenous contrast.
CONTRAST:  100mL OMNIPAQUE IOHEXOL 300 MG/ML  SOLN

[Series 3: abd/ pelvis 5.0 i30f 2 · axial · 0.80mm/px · z∈[+650,+1055]mm · 14 of 91 slices shown, 16 images]
[im 5/91  soft-tissue]
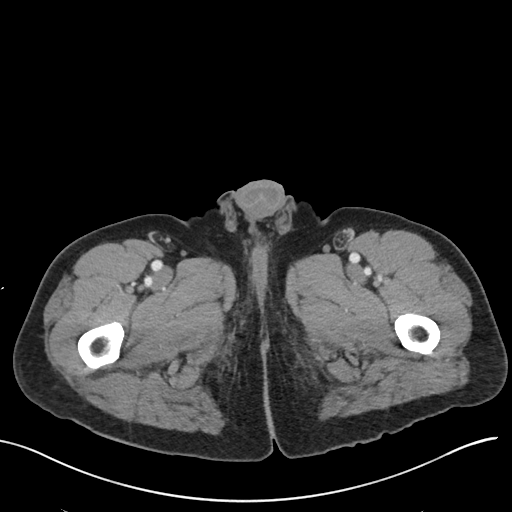
[im 5/91  bone]
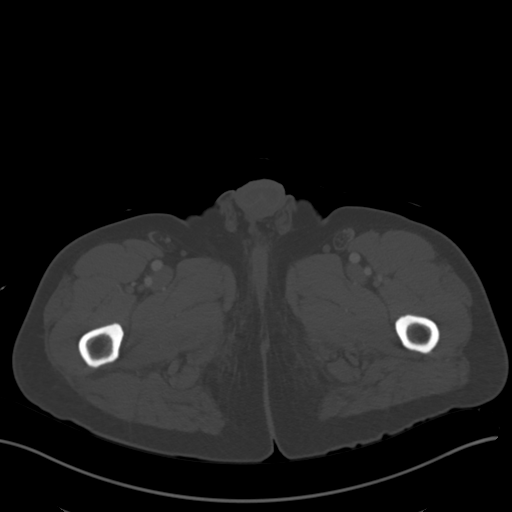
[im 13/91  soft-tissue]
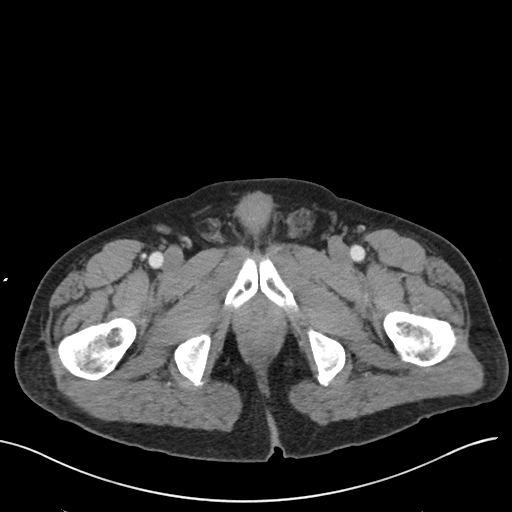
[im 18/91  soft-tissue]
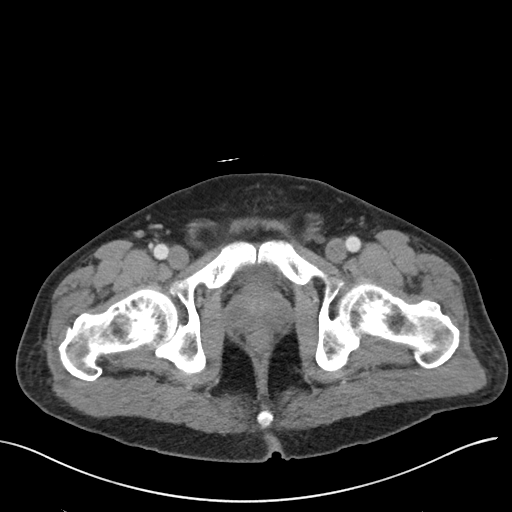
[im 26/91  soft-tissue]
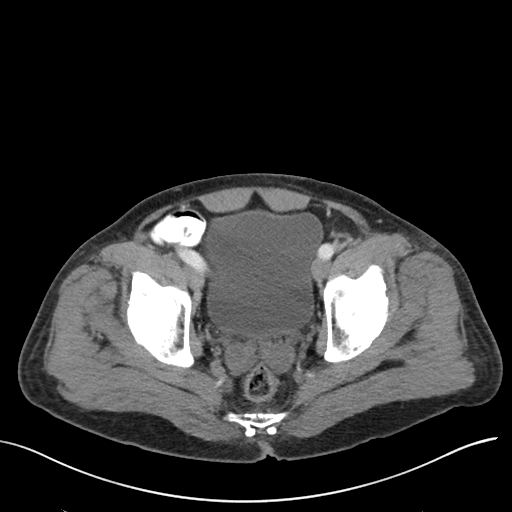
[im 31/91  soft-tissue]
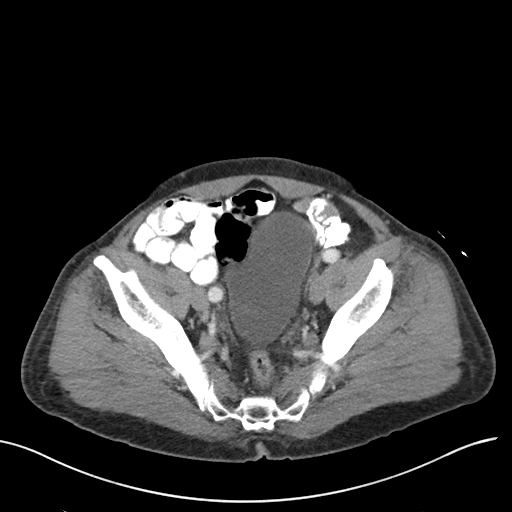
[im 35/91  soft-tissue]
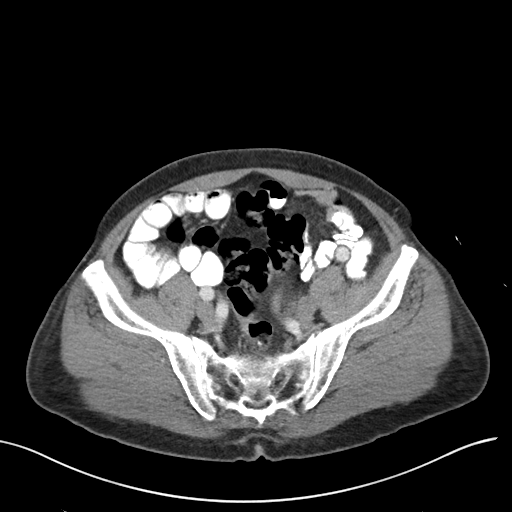
[im 43/91  soft-tissue]
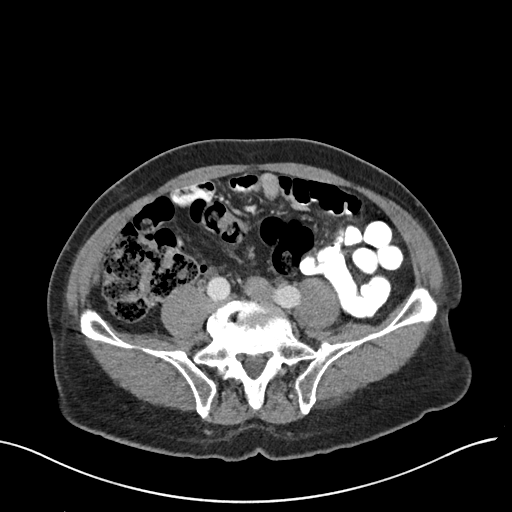
[im 48/91  soft-tissue]
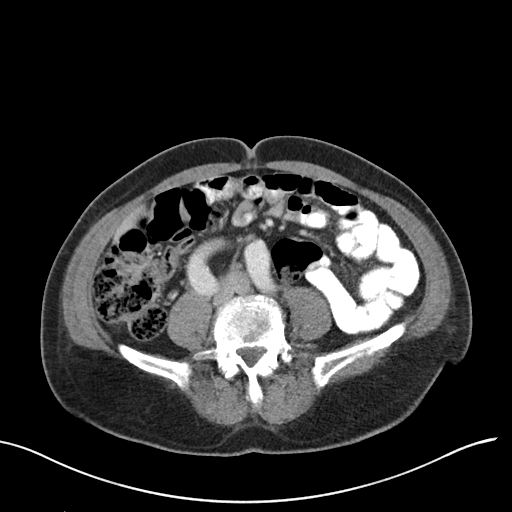
[im 56/91  soft-tissue]
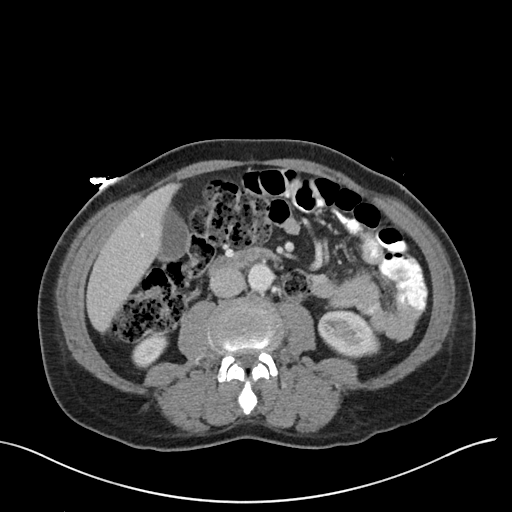
[im 56/91  bone]
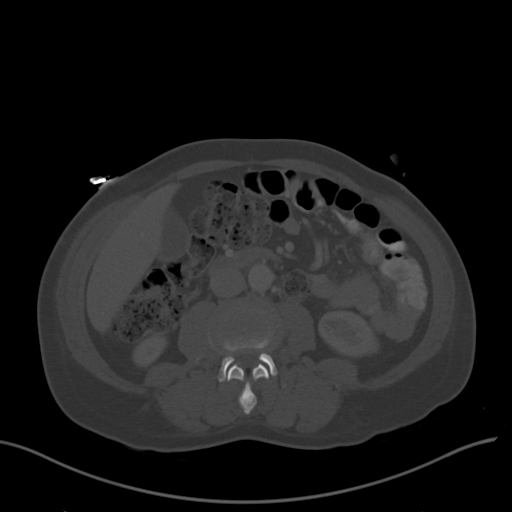
[im 61/91  soft-tissue]
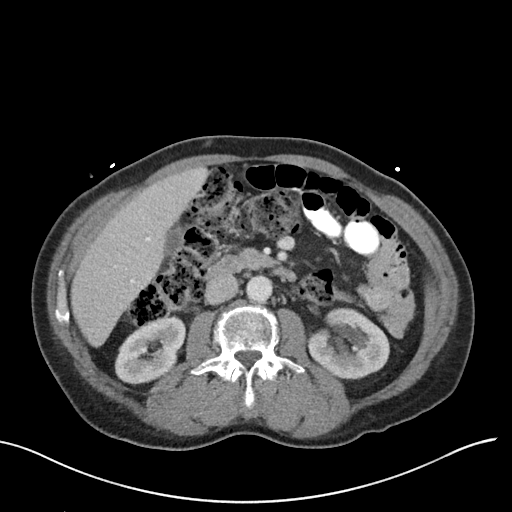
[im 69/91  soft-tissue]
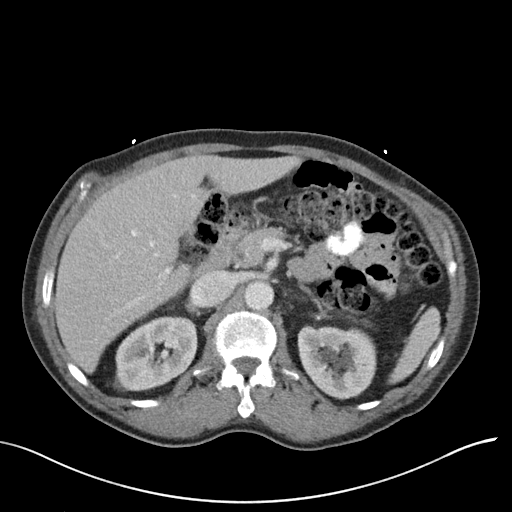
[im 73/91  soft-tissue]
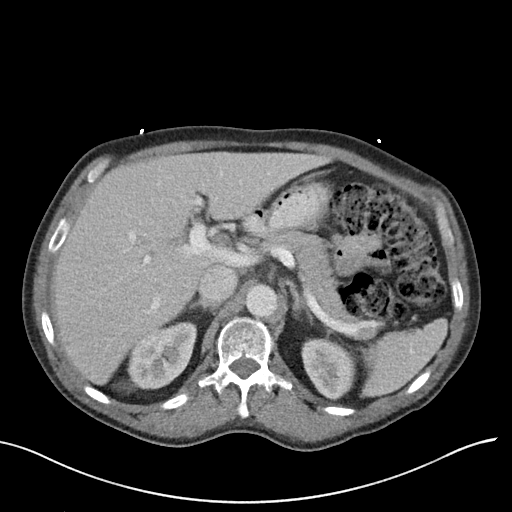
[im 78/91  soft-tissue]
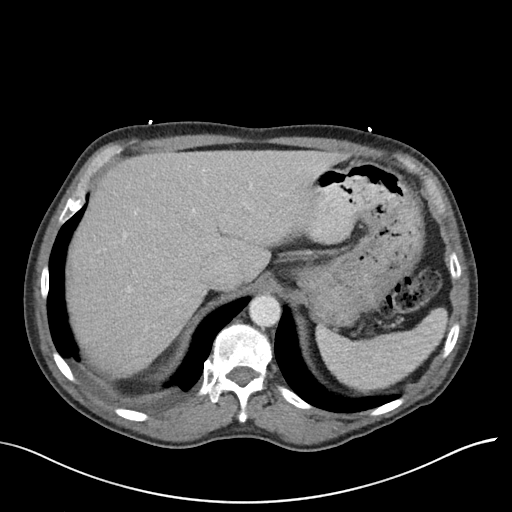
[im 86/91  soft-tissue]
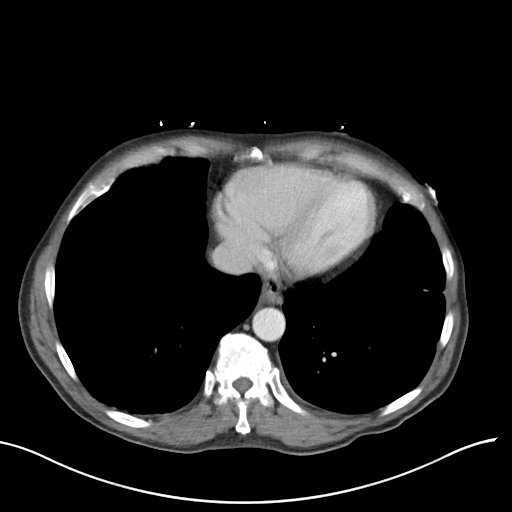

[Series 6: coronal soft tissue · coronal · 0.88mm/px · 3 of 111 slices shown]
[im 37/111  soft-tissue]
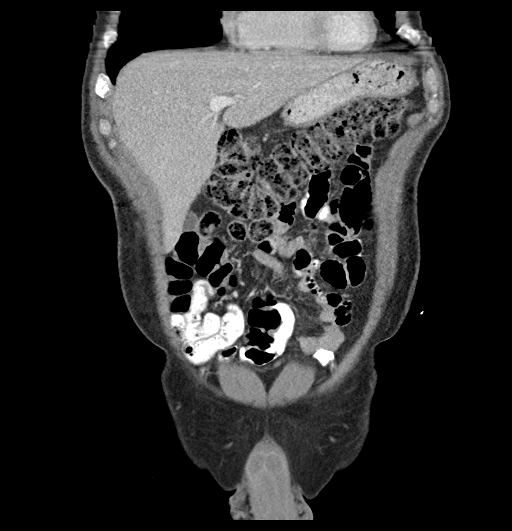
[im 49/111  soft-tissue]
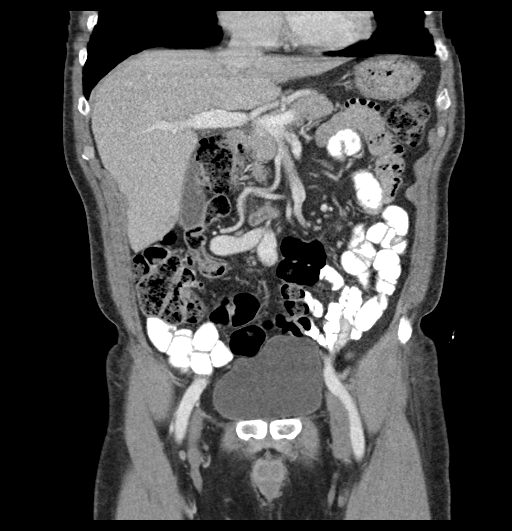
[im 62/111  soft-tissue]
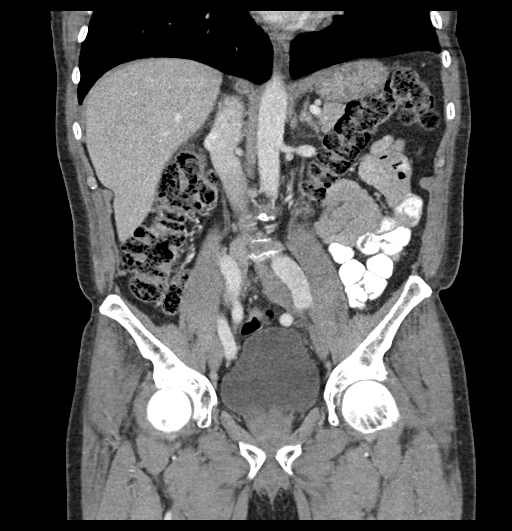

[17 of 46 positions shown; findings below may reference images not displayed]

FINDINGS: Lower chest: Lung bases are clear.

Hepatobiliary: Liver has a normal contour. Simple cysts in the RIGHT
hepatic lobe. No biliary duct dilatation. Gallbladder normal.

Pancreas: Pancreas is normal. No ductal dilatation. No pancreatic
inflammation.

Spleen: Normal spleen. Spleen measures 7.4 cm in craniocaudad
dimension.

Adrenals/urinary tract: Adrenal glands normal. Simple fluid cyst
extending from the RIGHT kidney. No ureteral obstruction. Bladder
normal

Stomach/Bowel: Stomach, small bowel, appendix, and cecum are normal.
The colon and rectosigmoid colon are normal.

Vascular/Lymphatic: Abdominal aorta is normal caliber with
atherosclerotic calcification. There is no retroperitoneal or
periportal lymphadenopathy. Abdominal aorta is normal caliber. There
is no retroperitoneal or periportal lymphadenopathy. No pelvic
lymphadenopathy.

Reproductive: Prostate normal.

Other: No free fluid.

Musculoskeletal: No aggressive osseous lesion. Endplate compression
deformity at T12 with Schmorl's node.
IMPRESSION: 1. No CT evidence of cirrhosis.
2. Normal spleen volume.
3. No ascites.
